# Patient Record
Sex: Female | Born: 1935 | Race: White | Hispanic: No | Marital: Single | Smoking: Former smoker
Health system: Southern US, Community
[De-identification: ages and names within clinical notes are randomized; demographics above are authoritative.]

## PROBLEM LIST (undated history)

## (undated) DIAGNOSIS — R609 Edema, unspecified: Secondary | ICD-10-CM

## (undated) DIAGNOSIS — J309 Allergic rhinitis, unspecified: Secondary | ICD-10-CM

## (undated) DIAGNOSIS — H35033 Hypertensive retinopathy, bilateral: Secondary | ICD-10-CM

## (undated) DIAGNOSIS — I350 Nonrheumatic aortic (valve) stenosis: Secondary | ICD-10-CM

## (undated) DIAGNOSIS — Z9189 Other specified personal risk factors, not elsewhere classified: Secondary | ICD-10-CM

## (undated) DIAGNOSIS — M549 Dorsalgia, unspecified: Secondary | ICD-10-CM

## (undated) DIAGNOSIS — M899 Disorder of bone, unspecified: Secondary | ICD-10-CM

## (undated) DIAGNOSIS — M949 Disorder of cartilage, unspecified: Secondary | ICD-10-CM

## (undated) DIAGNOSIS — R011 Cardiac murmur, unspecified: Secondary | ICD-10-CM

## (undated) DIAGNOSIS — M810 Age-related osteoporosis without current pathological fracture: Secondary | ICD-10-CM

## (undated) DIAGNOSIS — G231 Progressive supranuclear ophthalmoplegia [Steele-Richardson-Olszewski]: Secondary | ICD-10-CM

## (undated) DIAGNOSIS — K117 Disturbances of salivary secretion: Secondary | ICD-10-CM

## (undated) DIAGNOSIS — I10 Essential (primary) hypertension: Secondary | ICD-10-CM

## (undated) DIAGNOSIS — Z86010 Personal history of colonic polyps: Secondary | ICD-10-CM

## (undated) DIAGNOSIS — G20 Parkinson's disease: Secondary | ICD-10-CM

## (undated) DIAGNOSIS — N183 Chronic kidney disease, stage 3 unspecified: Secondary | ICD-10-CM

## (undated) HISTORY — DX: Nonrheumatic aortic (valve) stenosis: I35.0

## (undated) HISTORY — DX: Disturbances of salivary secretion: K11.7

## (undated) HISTORY — DX: Personal history of colonic polyps: Z86.010

## (undated) HISTORY — DX: Progressive supranuclear ophthalmoplegia (steele-Richardson-olszewski): G23.1

## (undated) HISTORY — DX: Hypertensive retinopathy, bilateral: H35.033

## (undated) HISTORY — DX: Disorder of cartilage, unspecified: M94.9

## (undated) HISTORY — DX: Other specified personal risk factors, not elsewhere classified: Z91.89

## (undated) HISTORY — DX: Disorder of bone, unspecified: M89.9

## (undated) HISTORY — PX: HEMORRHOID SURGERY: SHX153

## (undated) HISTORY — PX: BREAST SURGERY: SHX581

## (undated) HISTORY — PX: BREAST EXCISIONAL BIOPSY: SUR124

## (undated) HISTORY — DX: Chronic kidney disease, stage 3 unspecified: N18.30

## (undated) HISTORY — DX: Allergic rhinitis, unspecified: J30.9

## (undated) HISTORY — DX: Essential (primary) hypertension: I10

## (undated) HISTORY — DX: Edema, unspecified: R60.9

## (undated) HISTORY — DX: Age-related osteoporosis without current pathological fracture: M81.0

## (undated) HISTORY — DX: Dorsalgia, unspecified: M54.9

## (undated) HISTORY — DX: Cardiac murmur, unspecified: R01.1

## (undated) HISTORY — PX: TONSILLECTOMY: SHX5217

---

## 1997-10-27 ENCOUNTER — Ambulatory Visit (HOSPITAL_COMMUNITY): Admission: RE | Admit: 1997-10-27 | Discharge: 1997-10-27 | Payer: Self-pay | Admitting: Internal Medicine

## 1998-11-04 ENCOUNTER — Ambulatory Visit (HOSPITAL_COMMUNITY): Admission: RE | Admit: 1998-11-04 | Discharge: 1998-11-04 | Payer: Self-pay | Admitting: Obstetrics and Gynecology

## 1998-11-04 ENCOUNTER — Encounter: Payer: Self-pay | Admitting: Obstetrics and Gynecology

## 1998-11-10 ENCOUNTER — Encounter: Payer: Self-pay | Admitting: Obstetrics and Gynecology

## 1998-11-10 ENCOUNTER — Ambulatory Visit (HOSPITAL_COMMUNITY): Admission: RE | Admit: 1998-11-10 | Discharge: 1998-11-10 | Payer: Self-pay | Admitting: Obstetrics and Gynecology

## 1999-11-21 ENCOUNTER — Encounter: Payer: Self-pay | Admitting: Obstetrics and Gynecology

## 1999-11-21 ENCOUNTER — Ambulatory Visit (HOSPITAL_COMMUNITY): Admission: RE | Admit: 1999-11-21 | Discharge: 1999-11-21 | Payer: Self-pay | Admitting: Obstetrics and Gynecology

## 2000-10-16 ENCOUNTER — Encounter: Admission: RE | Admit: 2000-10-16 | Discharge: 2000-10-16 | Payer: Self-pay | Admitting: Obstetrics and Gynecology

## 2000-10-16 ENCOUNTER — Encounter: Payer: Self-pay | Admitting: Obstetrics and Gynecology

## 2000-11-21 ENCOUNTER — Encounter: Payer: Self-pay | Admitting: Obstetrics and Gynecology

## 2000-11-21 ENCOUNTER — Ambulatory Visit (HOSPITAL_COMMUNITY): Admission: RE | Admit: 2000-11-21 | Discharge: 2000-11-21 | Payer: Self-pay | Admitting: Obstetrics and Gynecology

## 2001-09-19 ENCOUNTER — Other Ambulatory Visit: Admission: RE | Admit: 2001-09-19 | Discharge: 2001-09-19 | Payer: Self-pay | Admitting: Obstetrics and Gynecology

## 2002-01-07 ENCOUNTER — Ambulatory Visit (HOSPITAL_COMMUNITY): Admission: RE | Admit: 2002-01-07 | Discharge: 2002-01-07 | Payer: Self-pay | Admitting: Obstetrics and Gynecology

## 2002-01-07 ENCOUNTER — Encounter: Payer: Self-pay | Admitting: Obstetrics and Gynecology

## 2003-01-11 ENCOUNTER — Ambulatory Visit (HOSPITAL_COMMUNITY): Admission: RE | Admit: 2003-01-11 | Discharge: 2003-01-11 | Payer: Self-pay | Admitting: Internal Medicine

## 2003-01-11 ENCOUNTER — Encounter: Payer: Self-pay | Admitting: Internal Medicine

## 2003-01-25 ENCOUNTER — Encounter: Admission: RE | Admit: 2003-01-25 | Discharge: 2003-01-25 | Payer: Self-pay | Admitting: Obstetrics and Gynecology

## 2004-02-10 ENCOUNTER — Ambulatory Visit (HOSPITAL_COMMUNITY): Admission: RE | Admit: 2004-02-10 | Discharge: 2004-02-10 | Payer: Self-pay | Admitting: Obstetrics and Gynecology

## 2004-02-16 ENCOUNTER — Ambulatory Visit: Payer: Self-pay | Admitting: Internal Medicine

## 2004-02-24 ENCOUNTER — Ambulatory Visit: Payer: Self-pay | Admitting: Internal Medicine

## 2004-03-08 ENCOUNTER — Other Ambulatory Visit: Admission: RE | Admit: 2004-03-08 | Discharge: 2004-03-08 | Payer: Self-pay | Admitting: Obstetrics and Gynecology

## 2004-04-28 ENCOUNTER — Ambulatory Visit: Payer: Self-pay | Admitting: Internal Medicine

## 2004-08-25 ENCOUNTER — Ambulatory Visit: Payer: Self-pay | Admitting: Internal Medicine

## 2004-11-28 ENCOUNTER — Ambulatory Visit: Payer: Self-pay | Admitting: Internal Medicine

## 2005-03-12 ENCOUNTER — Ambulatory Visit (HOSPITAL_COMMUNITY): Admission: RE | Admit: 2005-03-12 | Discharge: 2005-03-12 | Payer: Self-pay | Admitting: Obstetrics and Gynecology

## 2005-03-15 ENCOUNTER — Other Ambulatory Visit: Admission: RE | Admit: 2005-03-15 | Discharge: 2005-03-15 | Payer: Self-pay | Admitting: *Deleted

## 2005-05-24 ENCOUNTER — Ambulatory Visit: Payer: Self-pay | Admitting: Internal Medicine

## 2005-06-05 ENCOUNTER — Ambulatory Visit: Payer: Self-pay | Admitting: Internal Medicine

## 2005-06-15 ENCOUNTER — Ambulatory Visit: Payer: Self-pay | Admitting: Gastroenterology

## 2005-06-25 ENCOUNTER — Ambulatory Visit: Payer: Self-pay | Admitting: Gastroenterology

## 2005-06-25 ENCOUNTER — Encounter (INDEPENDENT_AMBULATORY_CARE_PROVIDER_SITE_OTHER): Payer: Self-pay | Admitting: *Deleted

## 2005-12-06 ENCOUNTER — Ambulatory Visit: Payer: Self-pay | Admitting: Internal Medicine

## 2006-03-15 ENCOUNTER — Ambulatory Visit (HOSPITAL_COMMUNITY): Admission: RE | Admit: 2006-03-15 | Discharge: 2006-03-15 | Payer: Self-pay | Admitting: Obstetrics & Gynecology

## 2006-06-04 ENCOUNTER — Ambulatory Visit: Payer: Self-pay | Admitting: Internal Medicine

## 2006-06-04 LAB — CONVERTED CEMR LAB
Albumin: 3.6 g/dL (ref 3.5–5.2)
Alkaline Phosphatase: 59 units/L (ref 39–117)
BUN: 11 mg/dL (ref 6–23)
Basophils Absolute: 0 10*3/uL (ref 0.0–0.1)
Cholesterol: 186 mg/dL (ref 0–200)
Eosinophils Absolute: 0.1 10*3/uL (ref 0.0–0.6)
GFR calc Af Amer: 63 mL/min
HDL: 52.4 mg/dL (ref >39.0)
Hemoglobin: 13.8 g/dL (ref 12.0–15.0)
LDL Cholesterol: 111 mg/dL — ABNORMAL HIGH (ref 0–99)
Lymphocytes Relative: 24.7 % (ref 12.0–46.0)
MCHC: 34.4 g/dL (ref 30.0–36.0)
MCV: 87.5 fL (ref 78.0–100.0)
Monocytes Absolute: 0.4 10*3/uL (ref 0.2–0.7)
Monocytes Relative: 6.7 % (ref 3.0–11.0)
Neutro Abs: 4.3 10*3/uL (ref 1.4–7.7)
Potassium: 4.4 meq/L (ref 3.5–5.1)
TSH: 1.14 microintl units/mL (ref 0.35–5.50)

## 2006-11-21 DIAGNOSIS — I10 Essential (primary) hypertension: Secondary | ICD-10-CM | POA: Insufficient documentation

## 2006-11-21 DIAGNOSIS — Z860100 Personal history of colon polyps, unspecified: Secondary | ICD-10-CM

## 2006-11-21 DIAGNOSIS — M810 Age-related osteoporosis without current pathological fracture: Secondary | ICD-10-CM

## 2006-11-21 DIAGNOSIS — J309 Allergic rhinitis, unspecified: Secondary | ICD-10-CM

## 2006-11-21 HISTORY — DX: Age-related osteoporosis without current pathological fracture: M81.0

## 2006-11-21 HISTORY — DX: Essential (primary) hypertension: I10

## 2006-11-21 HISTORY — DX: Personal history of colon polyps, unspecified: Z86.0100

## 2006-11-21 HISTORY — DX: Allergic rhinitis, unspecified: J30.9

## 2006-11-21 HISTORY — DX: Personal history of colonic polyps: Z86.010

## 2006-12-17 ENCOUNTER — Ambulatory Visit: Payer: Self-pay | Admitting: Internal Medicine

## 2006-12-17 DIAGNOSIS — M549 Dorsalgia, unspecified: Secondary | ICD-10-CM | POA: Insufficient documentation

## 2006-12-17 HISTORY — DX: Dorsalgia, unspecified: M54.9

## 2007-02-06 ENCOUNTER — Other Ambulatory Visit: Admission: RE | Admit: 2007-02-06 | Discharge: 2007-02-06 | Payer: Self-pay | Admitting: Obstetrics & Gynecology

## 2007-03-28 ENCOUNTER — Ambulatory Visit (HOSPITAL_COMMUNITY): Admission: RE | Admit: 2007-03-28 | Discharge: 2007-03-28 | Payer: Self-pay | Admitting: Obstetrics & Gynecology

## 2007-06-09 ENCOUNTER — Ambulatory Visit: Payer: Self-pay | Admitting: Internal Medicine

## 2007-12-05 ENCOUNTER — Ambulatory Visit: Payer: Self-pay | Admitting: Internal Medicine

## 2007-12-05 LAB — CONVERTED CEMR LAB
ALT: 17 units/L (ref 0–35)
Basophils Absolute: 0 10*3/uL (ref 0.0–0.1)
Bilirubin, Direct: 0.1 mg/dL (ref 0.0–0.3)
CO2: 31 meq/L (ref 19–32)
Calcium: 9.1 mg/dL (ref 8.4–10.5)
Cholesterol: 174 mg/dL (ref 0–200)
Creatinine, Ser: 1.1 mg/dL (ref 0.4–1.2)
Eosinophils Absolute: 0.1 10*3/uL (ref 0.0–0.7)
GFR calc non Af Amer: 52 mL/min
Hemoglobin: 13.1 g/dL (ref 12.0–15.0)
LDL Cholesterol: 104 mg/dL — ABNORMAL HIGH (ref 0–99)
Lymphocytes Relative: 29.5 % (ref 12.0–46.0)
MCHC: 35 g/dL (ref 30.0–36.0)
MCV: 88.8 fL (ref 78.0–100.0)
Neutro Abs: 3.4 10*3/uL (ref 1.4–7.7)
Neutrophils Relative %: 61.6 % (ref 43.0–77.0)
RDW: 11.9 % (ref 11.5–14.6)
Sodium: 141 meq/L (ref 135–145)
TSH: 1.36 microintl units/mL (ref 0.35–5.50)
Total Bilirubin: 0.8 mg/dL (ref 0.3–1.2)
Triglycerides: 116 mg/dL (ref 0–149)
VLDL: 23 mg/dL (ref 0–40)

## 2008-04-20 ENCOUNTER — Ambulatory Visit (HOSPITAL_COMMUNITY): Admission: RE | Admit: 2008-04-20 | Discharge: 2008-04-20 | Payer: Self-pay | Admitting: Obstetrics & Gynecology

## 2008-06-04 ENCOUNTER — Ambulatory Visit: Payer: Self-pay | Admitting: Internal Medicine

## 2008-07-05 ENCOUNTER — Ambulatory Visit: Payer: Self-pay | Admitting: Internal Medicine

## 2008-07-05 DIAGNOSIS — R609 Edema, unspecified: Secondary | ICD-10-CM | POA: Insufficient documentation

## 2008-07-05 HISTORY — DX: Edema, unspecified: R60.9

## 2008-10-12 ENCOUNTER — Ambulatory Visit: Payer: Self-pay | Admitting: Internal Medicine

## 2008-10-12 DIAGNOSIS — R011 Cardiac murmur, unspecified: Secondary | ICD-10-CM

## 2008-10-12 HISTORY — DX: Cardiac murmur, unspecified: R01.1

## 2008-10-18 ENCOUNTER — Encounter: Payer: Self-pay | Admitting: Internal Medicine

## 2008-10-18 ENCOUNTER — Ambulatory Visit: Payer: Self-pay

## 2008-10-19 ENCOUNTER — Telehealth: Payer: Self-pay | Admitting: *Deleted

## 2009-02-10 ENCOUNTER — Ambulatory Visit: Payer: Self-pay | Admitting: Internal Medicine

## 2009-02-10 LAB — CONVERTED CEMR LAB
ALT: 19 units/L (ref 0–35)
AST: 20 units/L (ref 0–37)
BUN: 20 mg/dL (ref 6–23)
Basophils Absolute: 0 10*3/uL (ref 0.0–0.1)
Bilirubin, Direct: 0.1 mg/dL (ref 0.0–0.3)
Calcium: 9.8 mg/dL (ref 8.4–10.5)
Cholesterol: 184 mg/dL (ref 0–200)
Creatinine, Ser: 1.2 mg/dL (ref 0.4–1.2)
Eosinophils Relative: 1.7 % (ref 0.0–5.0)
GFR calc non Af Amer: 46.61 mL/min (ref >60)
Glucose, Bld: 91 mg/dL (ref 70–99)
HCT: 39.4 % (ref 36.0–46.0)
HDL: 50.9 mg/dL (ref >39.00)
LDL Cholesterol: 103 mg/dL — ABNORMAL HIGH (ref 0–99)
Lymphs Abs: 1.8 10*3/uL (ref 0.7–4.0)
Monocytes Relative: 6.2 % (ref 3.0–12.0)
Neutrophils Relative %: 63.9 % (ref 43.0–77.0)
Platelets: 158 10*3/uL (ref 150.0–400.0)
Potassium: 4.5 meq/L (ref 3.5–5.1)
RDW: 11.8 % (ref 11.5–14.6)
TSH: 1.38 microintl units/mL (ref 0.35–5.50)
Total Bilirubin: 0.8 mg/dL (ref 0.3–1.2)
Triglycerides: 152 mg/dL — ABNORMAL HIGH (ref 0.0–149.0)
VLDL: 30.4 mg/dL (ref 0.0–40.0)
WBC: 6.4 10*3/uL (ref 4.5–10.5)

## 2009-04-28 ENCOUNTER — Encounter: Payer: Self-pay | Admitting: Internal Medicine

## 2009-04-28 ENCOUNTER — Ambulatory Visit (HOSPITAL_COMMUNITY): Admission: RE | Admit: 2009-04-28 | Discharge: 2009-04-28 | Payer: Self-pay | Admitting: Internal Medicine

## 2009-04-28 LAB — HM MAMMOGRAPHY: HM Mammogram: NEGATIVE

## 2009-08-11 ENCOUNTER — Ambulatory Visit: Payer: Self-pay | Admitting: Internal Medicine

## 2009-08-11 DIAGNOSIS — M949 Disorder of cartilage, unspecified: Secondary | ICD-10-CM

## 2009-08-11 DIAGNOSIS — M899 Disorder of bone, unspecified: Secondary | ICD-10-CM | POA: Insufficient documentation

## 2009-08-11 HISTORY — DX: Disorder of bone, unspecified: M89.9

## 2010-02-09 ENCOUNTER — Ambulatory Visit: Payer: Self-pay | Admitting: Internal Medicine

## 2010-03-26 HISTORY — PX: CATARACT EXTRACTION: SUR2

## 2010-04-24 ENCOUNTER — Other Ambulatory Visit: Payer: Self-pay | Admitting: Internal Medicine

## 2010-04-24 DIAGNOSIS — Z1239 Encounter for other screening for malignant neoplasm of breast: Secondary | ICD-10-CM

## 2010-04-27 NOTE — Assessment & Plan Note (Signed)
Summary: 6 month fup//ccm   Vital Signs:  Patient profile:   77 year old female Weight:      169 pounds Temp:     97.9 degrees F oral BP sitting:   122 / 74  (right arm) Cuff size:   regular  Vitals Entered By: Duard Brady LPN (February 09, 2010 10:30 AM) andCC: 6 mos rov - doing well  Is Patient Diabetic? No   CC:  6 mos rov - doing well .  History of Present Illness: 77 year old patient who is seen today for follow-up.  Her in she has a history of osteoporosis and has self discontinued alendronate, recently did to some job, discomfort, and a popping sensation.  She has been on this medicine approximately 6 years she has treated hypertension, which has been stable.  She has a history of colonic polyps.  She has a history of  a systolic heart murmur.  She also has a history of allergic rhinitis, which has been stable  Allergies: 1)  ! Codeine Phosphate (Codeine Phosphate)  Past History:  Past Medical History: Reviewed history from 08/11/2009 and no changes required. Colonic polyps, hx of Hypertension  Allergic rhinitis Aortic sclerosis, hx Osteopenia  Past Surgical History: Reviewed history from 12/05/2007 and no changes required. Hemorrhoidectomy Tonsillectomy age  43 Breast biopsy  colonoscopy April 2007  Family History: Reviewed history from 02/10/2009 and no changes required. father died at 26.  Coronary artery disease.   Mother died age 58, ovarian cancer one of 54.  Siblings positive for senile dementia breast cancer, COPD; also positive for brain cancer; CAD  Social History: Reviewed history from 02/10/2009 and no changes required. Married Never Smoked two sons, one grandson; two-step granddaughters  Review of Systems  The patient denies anorexia, fever, weight loss, weight gain, vision loss, decreased hearing, hoarseness, chest pain, syncope, dyspnea on exertion, peripheral edema, prolonged cough, headaches, hemoptysis, abdominal pain, melena,  hematochezia, severe indigestion/heartburn, hematuria, incontinence, genital sores, muscle weakness, suspicious skin lesions, transient blindness, difficulty walking, depression, unusual weight change, abnormal bleeding, enlarged lymph nodes, angioedema, and breast masses.    Physical Exam  General:  Well-developed,well-nourished,in no acute distress; alert,appropriate and cooperative throughout examination Head:  Normocephalic and atraumatic without obvious abnormalities. No apparent alopecia or balding. Eyes:  No corneal or conjunctival inflammation noted. EOMI. Perrla. Funduscopic exam benign, without hemorrhages, exudates or papilledema. Vision grossly normal. Mouth:  Oral mucosa and oropharynx without lesions or exudates.  Teeth in good repair. Neck:  No deformities, masses, or tenderness noted. Lungs:  Normal respiratory effort, chest expands symmetrically. Lungs are clear to auscultation, no crackles or wheezes. Heart:  grade 3/6 systolic murmur, loudest at the primary aortic area with radiation to the supraclavicular area Abdomen:  Bowel sounds positive,abdomen soft and non-tender without masses, organomegaly or hernias noted. Msk:  No deformity or scoliosis noted of thoracic or lumbar spine.   Pulses:  R and L carotid,radial,femoral,dorsalis pedis and posterior tibial pulses are full and equal bilaterally Extremities:  No clubbing, cyanosis, edema, or deformity noted with normal full range of motion of all joints.     Impression & Recommendations:  Problem # 1:  OSTEOPENIA (ICD-733.90)  The following medications were removed from the medication list:    Alendronate Sodium 70 Mg Tabs (Alendronate sodium) ..... One weekly  The following medications were removed from the medication list:    Alendronate Sodium 70 Mg Tabs (Alendronate sodium) ..... One weekly  Problem # 2:  SYSTOLIC MURMUR (ZOX-096.2)  Problem #  3:  HYPERTENSION (ICD-401.9)  Her updated medication list for this  problem includes:    Hydrochlorothiazide 25 Mg Tabs (Hydrochlorothiazide) .Marland Kitchen... 1 once daily    Benazepril Hcl 40 Mg Tabs (Benazepril hcl) .Marland Kitchen... 1 once daily  Her updated medication list for this problem includes:    Hydrochlorothiazide 25 Mg Tabs (Hydrochlorothiazide) .Marland Kitchen... 1 once daily    Benazepril Hcl 40 Mg Tabs (Benazepril hcl) .Marland Kitchen... 1 once daily  Problem # 4:  COLONIC POLYPS, HX OF (ICD-V12.72)  Complete Medication List: 1)  Hydrochlorothiazide 25 Mg Tabs (Hydrochlorothiazide) .Marland Kitchen.. 1 once daily 2)  Benazepril Hcl 40 Mg Tabs (Benazepril hcl) .Marland Kitchen.. 1 once daily 3)  Caltrate 600 1500 Mg Tabs (Calcium carbonate) .... Two times a day 4)  Vitamin D 1000 Unit Caps (Cholecalciferol) .... Once daily 5)  Adult Aspirin Ec Low Strength 81 Mg Tbec (Aspirin) .Marland Kitchen.. 1 once daily 6)  Multivitamins Caps (Multiple vitamin) .... Qd  Other Orders: Pneumococcal Vaccine (16109) Admin 1st Vaccine (60454)  Patient Instructions: 1)  Please schedule a follow-up appointment in 6 months. 2)  Limit your Sodium (Salt). 3)  It is important that you exercise regularly at least 20 minutes 5 times a week. If you develop chest pain, have severe difficulty breathing, or feel very tired , stop exercising immediately and seek medical attention. 4)  Take calcium +Vitamin D daily. 5)  Advised not to eat any food or drink any liquids after 10 PM the night before your procedure. Prescriptions: BENAZEPRIL HCL 40 MG  TABS (BENAZEPRIL HCL) 1 once daily  #90 x 6   Entered and Authorized by:   Gordy Savers  MD   Signed by:   Gordy Savers  MD on 02/09/2010   Method used:   Electronically to        [facility]* (retail)             ,          Ph: [phone]       Fax: [phone]   RxID:   [id number] HYDROCHLOROTHIAZIDE 25 MG  TABS (HYDROCHLOROTHIAZIDE) 1 once daily  #90 x 6   Entered and Authorized by:   Gordy Savers  MD   Signed by:   Gordy Savers  MD on 02/09/2010   Method used:    Electronically to        [facility]* (retail)             ,          Ph: [phone]       Fax: [phone]   RxID:   [id number]    Orders Added: 1)  Pneumococcal Vaccine [90732] 2)  Admin 1st Vaccine [90471] 3)  Est. Patient Level IV [88416]   Immunization History:  Influenza Immunization History:    Influenza:  Historical (01/12/2010)  Pneumovax Immunization History:    Pneumovax:  Pneumovax (02/09/2010)  Immunizations Administered:  Pneumonia Vaccine:    Vaccine Type: Pneumovax    Site: left deltoid    Mfr: Merck    Dose: 0.5 ml    Route: IM    Given by: Duard Brady LPN    Exp. Date: 07/18/2011    Lot #: [id number]    VIS given: 02/28/09 version given February 09, 2010.    Physician counseled: yes   Immunization History:  Influenza Immunization History:    Influenza:  Historical (01/12/2010)  Immunizations Administered:  Pneumonia Vaccine:    Vaccine Type: Pneumovax  Site: left deltoid    Mfr: Merck    Dose: 0.5 ml    Route: IM    Given by: Duard Brady LPN    Exp. Date: 07/18/2011    Lot #: [id number]    VIS given: 02/28/09 version given February 09, 2010.    Physician counseled: yes #2 given kik  flu given at [facility] per pt. kik

## 2010-04-27 NOTE — Assessment & Plan Note (Signed)
Summary: 6 MONTH FUP//CCM   Vital Signs:  Patient profile:   77 year old female Weight:      168 pounds Temp:     98.0 degrees F oral BP sitting:   128 / 80  (left arm) Cuff size:   regular  Vitals Entered By: Duard Brady LPN (Aug 11, 2009 9:59 AM) CC: 6 mov rov - doing well Is Patient Diabetic? No   CC:  6 mov rov - doing well.  History of Present Illness: 33 -year-old patient who is seen today for follow-up.  She has a history of osteopenia and Fosamax was discontinued about one year ago after 5 years of therapy.  She has had a recent bone density that reveals a decline in bone density since her last BMD.  She has hypertension, which has been well-controlled.  She also has a history of colonic polyps and a history of a systolic murmur related to mild aortic sclerosis.  A follow-up 2-D echocardiogram was done in July of 2010.  Allergies: 1)  ! Codeine Phosphate (Codeine Phosphate)  Past History:  Past Medical History: Colonic polyps, hx of Hypertension  Allergic rhinitis Aortic sclerosis, hx Osteopenia  Family History: Reviewed history from 02/10/2009 and no changes required. father died at 29.  Coronary artery disease.   Mother died age 72, ovarian cancer one of 63.  Siblings positive for senile dementia breast cancer, COPD; also positive for brain cancer; CAD  Social History: Reviewed history from 02/10/2009 and no changes required. Married Never Smoked two sons, one grandson; two-step granddaughters  Review of Systems  The patient denies anorexia, fever, weight loss, weight gain, vision loss, decreased hearing, hoarseness, chest pain, syncope, dyspnea on exertion, peripheral edema, prolonged cough, headaches, hemoptysis, abdominal pain, melena, hematochezia, severe indigestion/heartburn, hematuria, incontinence, genital sores, muscle weakness, suspicious skin lesions, transient blindness, difficulty walking, depression, unusual weight change, abnormal  bleeding, enlarged lymph nodes, angioedema, and breast masses.    Physical Exam  General:  Well-developed,well-nourished,in no acute distress; alert,appropriate and cooperative throughout examination Head:  Normocephalic and atraumatic without obvious abnormalities. No apparent alopecia or balding. Eyes:  No corneal or conjunctival inflammation noted. EOMI. Perrla. Funduscopic exam benign, without hemorrhages, exudates or papilledema. Vision grossly normal. Mouth:  Oral mucosa and oropharynx without lesions or exudates.  Teeth in good repair. Neck:  transmitter murmur, right greater than left Lungs:  Normal respiratory effort, chest expands symmetrically. Lungs are clear to auscultation, no crackles or wheezes. Heart:  grade 3/6 systolic murmur, loudest at the primary aortic area Abdomen:  Bowel sounds positive,abdomen soft and non-tender without masses, organomegaly or hernias noted. Msk:  No deformity or scoliosis noted of thoracic or lumbar spine.   Pulses:  R and L carotid,radial,femoral,dorsalis pedis and posterior tibial pulses are full and equal bilaterally Extremities:  No clubbing, cyanosis, edema, or deformity noted with normal full range of motion of all joints.     Impression & Recommendations:  Problem # 1:  OSTEOPENIA (ICD-733.90)  Her updated medication list for this problem includes:    Alendronate Sodium 70 Mg Tabs (Alendronate sodium) ..... One weekly  Her updated medication list for this problem includes:    Alendronate Sodium 70 Mg Tabs (Alendronate sodium) ..... One weekly  Problem # 2:  SYSTOLIC MURMUR (ZOX-096.2)  Problem # 3:  HYPERTENSION (ICD-401.9)  Her updated medication list for this problem includes:    Hydrochlorothiazide 25 Mg Tabs (Hydrochlorothiazide) .Marland Kitchen... 1 once daily    Benazepril Hcl 40 Mg Tabs (Benazepril  hcl) ..... 1 once daily    Her updated medication list for this problem includes:    Hydrochlorothiazide 25 Mg Tabs (Hydrochlorothiazide)  .Marland Kitchen... 1 once daily    Benazepril Hcl 40 Mg Tabs (Benazepril hcl) .Marland Kitchen... 1 once daily  Complete Medication List: 1)  Hydrochlorothiazide 25 Mg Tabs (Hydrochlorothiazide) .Marland Kitchen.. 1 once daily 2)  Benazepril Hcl 40 Mg Tabs (Benazepril hcl) .Marland Kitchen.. 1 once daily 3)  Caltrate 600 1500 Mg Tabs (Calcium carbonate) .... Two times a day 4)  Vitamin D 1000 Unit Caps (Cholecalciferol) .... Once daily 5)  Adult Aspirin Ec Low Strength 81 Mg Tbec (Aspirin) .Marland Kitchen.. 1 once daily 6)  Multivitamins Caps (Multiple vitamin) .... Qd 7)  Alendronate Sodium 70 Mg Tabs (Alendronate sodium) .... One weekly  Other Orders: Prescription Created Electronically [id number])  Patient Instructions: 1)  Please schedule a follow-up appointment in 6 months. 2)  Advised not to eat any food or drink any liquids after 10 PM the night before your procedure. 3)  Limit your Sodium (Salt) to less than 2 grams a day(slightly less than 1/2 a teaspoon) to prevent fluid retention, swelling, or worsening of symptoms. 4)  It is important that you exercise regularly at least 20 minutes 5 times a week. If you develop chest pain, have severe difficulty breathing, or feel very tired , stop exercising immediately and seek medical attention. 5)  Take calcium +Vitamin D daily. 6)  Check your Blood Pressure regularly. If it is above: 150/90  you should make an appointment. Prescriptions: ALENDRONATE SODIUM 70 MG TABS (ALENDRONATE SODIUM) one weekly  #12 x 6   Entered and Authorized by:   Gordy Savers  MD   Signed by:   Gordy Savers  MD on 08/11/2009   Method used:   Faxed to ...       [facility]* (mail-order)             ,          Ph: [phone]       Fax: [phone]   RxID:   [id number] BENAZEPRIL HCL 40 MG  TABS (BENAZEPRIL HCL) 1 once daily  #90 x 6   Entered and Authorized by:   Gordy Savers  MD   Signed by:   Gordy Savers  MD on 08/11/2009   Method used:   Faxed to ...       [facility]*  (mail-order)             ,          Ph: [phone]       Fax: [phone]   RxID:   [id number] HYDROCHLOROTHIAZIDE 25 MG  TABS (HYDROCHLOROTHIAZIDE) 1 once daily  #90 x 6   Entered and Authorized by:   Gordy Savers  MD   Signed by:   Gordy Savers  MD on 08/11/2009   Method used:   Faxed to ...       [facility]* (mail-order)             ,          Ph: [phone]       Fax: [phone]   RxID:   [id number]

## 2010-05-01 ENCOUNTER — Ambulatory Visit (HOSPITAL_COMMUNITY)
Admission: RE | Admit: 2010-05-01 | Discharge: 2010-05-01 | Disposition: A | Discharge status: Home / Self Care | Payer: Medicare Other | Source: Ambulatory Visit | Attending: Internal Medicine | Admitting: Internal Medicine

## 2010-05-01 DIAGNOSIS — Z1231 Encounter for screening mammogram for malignant neoplasm of breast: Secondary | ICD-10-CM | POA: Insufficient documentation

## 2010-05-01 DIAGNOSIS — Z1239 Encounter for other screening for malignant neoplasm of breast: Secondary | ICD-10-CM

## 2010-08-09 ENCOUNTER — Encounter: Payer: Self-pay | Admitting: Internal Medicine

## 2010-08-11 NOTE — Assessment & Plan Note (Signed)
[facility] OFFICE NOTE   NAME:Tiffany [name], Tiffany [name]                     MRN:          [id number]  DATE:06/04/2006                            DOB:          [birth year]    A 77 year old female seen today for a wellness exam.   She has:  1. Hypertension.  2. Menopausal syndrome.  3. Osteoporosis.  4. Seasonal allergic rhinitis.  5. Also has a history of chronic polyps.   She is doing quite well today.  Does have a gynecologist she sees  annually with annual mammograms.   REVIEW OF SYSTEMS:  Exam is unremarkable.   Did have colonoscopy 11 months ago.  This revealed a single small 7-mm  polyp.   FAMILY HISTORY:  Reviewed.  Basically unchanged.  She is 1 of 17  siblings.   EXAMINATION:  Revealed a healthy-appearing fit patient in no acute  distress.  Blood pressure was 128/78.  SKIN:  Negative.  Fundi, ears, nose and throat clear.  NECK:  Revealed transmitted murmur on the right.  CHEST:  Was clear.  CARDIOVASCULAR EXAM:  Revealed a grade 2-3/6 systolic murmur loudest at  the base.  BREASTS:  Negative.  ABDOMEN:  Soft and non-tender.  No bruits.  EXTREMITIES:  Were slightly cool.  The right dorsalis pedis pulse was  full.  The left was barely palpable.  Posterior tibial pulses were  nonpalpable.  NEUROLOGIC EXAM:  Negative.   IMPRESSION:  1. Hypertension.  2. Osteoporosis.  3. Chronic polyps.  4. Menopausal syndrome.  5. History of aortic sclerosis.   DISPOSITION:  Medical regimen unchanged.  Daily aspirin recommended.  We  will also increase her vitamin D to at least 800 units daily.  Recheck  in 6 months.     Gordy Savers, MD  Electronically Signed    PFK/MedQ  DD: 06/04/2006  DT: 06/04/2006  Job #: [id number]

## 2010-08-14 ENCOUNTER — Encounter: Payer: Self-pay | Admitting: Internal Medicine

## 2010-08-14 ENCOUNTER — Ambulatory Visit (INDEPENDENT_AMBULATORY_CARE_PROVIDER_SITE_OTHER): Payer: Medicare Other | Admitting: Internal Medicine

## 2010-08-14 DIAGNOSIS — M899 Disorder of bone, unspecified: Secondary | ICD-10-CM

## 2010-08-14 DIAGNOSIS — M810 Age-related osteoporosis without current pathological fracture: Secondary | ICD-10-CM

## 2010-08-14 DIAGNOSIS — Z0000 Encounter for general adult medical examination without abnormal findings: Secondary | ICD-10-CM

## 2010-08-14 DIAGNOSIS — R011 Cardiac murmur, unspecified: Secondary | ICD-10-CM

## 2010-08-14 DIAGNOSIS — I10 Essential (primary) hypertension: Secondary | ICD-10-CM

## 2010-08-14 DIAGNOSIS — Z86010 Personal history of colonic polyps: Secondary | ICD-10-CM

## 2010-08-14 DIAGNOSIS — M549 Dorsalgia, unspecified: Secondary | ICD-10-CM

## 2010-08-14 LAB — BASIC METABOLIC PANEL
BUN: 17 mg/dL (ref 6–23)
CO2: 30 mEq/L (ref 19–32)
Chloride: 101 mEq/L (ref 96–112)
Creatinine, Ser: 1.1 mg/dL (ref 0.4–1.2)
Potassium: 5.2 mEq/L — ABNORMAL HIGH (ref 3.5–5.1)

## 2010-08-14 LAB — CBC WITH DIFFERENTIAL/PLATELET
Basophils Relative: 0.5 % (ref 0.0–3.0)
Eosinophils Absolute: 0.1 10*3/uL (ref 0.0–0.7)
Eosinophils Relative: 1.6 % (ref 0.0–5.0)
HCT: 38.2 % (ref 36.0–46.0)
Lymphs Abs: 1.9 10*3/uL (ref 0.7–4.0)
MCHC: 34.7 g/dL (ref 30.0–36.0)
MCV: 89.3 fl (ref 78.0–100.0)
Monocytes Absolute: 0.3 10*3/uL (ref 0.1–1.0)
RBC: 4.27 Mil/uL (ref 3.87–5.11)
WBC: 6 10*3/uL (ref 4.5–10.5)

## 2010-08-14 LAB — LIPID PANEL
Cholesterol: 180 mg/dL (ref 0–200)
HDL: 54.4 mg/dL (ref >39.00)
Triglycerides: 116 mg/dL (ref 0.0–149.0)

## 2010-08-14 LAB — HEPATIC FUNCTION PANEL
ALT: 20 U/L (ref 0–35)
Albumin: 3.6 g/dL (ref 3.5–5.2)
Total Protein: 6.4 g/dL (ref 6.0–8.3)

## 2010-08-14 MED ORDER — BENAZEPRIL HCL 40 MG PO TABS: 40.0000 mg | ORAL_TABLET | Freq: Every day | ORAL | Status: DC

## 2010-08-14 MED ORDER — HYDROCHLOROTHIAZIDE 25 MG PO TABS: 25.0000 mg | ORAL_TABLET | Freq: Every day | ORAL | Status: DC

## 2010-08-14 NOTE — Progress Notes (Signed)
Subjective:    Patient ID: Tiffany [name], female    DOB: 1932/03/28, 77 y.o.   MRN: [id number]  HPI   77 year-old patient who is seen today for a annual health maintenance examination. She has a long history of treated hypertension and this has been stable. She remains on Lotensin and hydrochlorothiazide. She is followed annually by gynecology with annual mammograms. She has had serial bone density studies that have revealed stable osteopenia. She remains on calcium and vitamin D supplements. She has chronic but mild low back pain. This has been unchanged. She has a history of a systolic murmur that is related to a mildly calcified aortic valve there is been no stenosis. She has no exertional symptoms. She has a history of colonic polyps and did have a colonoscopy done within the last 5 years.  1. Risk factors, based on past  M,S,F history- cardiovascular risk factors include hypertension  2.  Physical activities: No exercise restriction although she walks infrequently 3.  Depression/mood: No history of depression or mood disorder 4.  Hearing: No deficits 5.  ADL's: Independent in all aspects of daily living  6.  Fall risk: Low  7.  Home safety: No problems identified  8.  Height weight, and visual acuity; height and weight stable recent left cataract extraction surgery  9.  Counseling: More regular exercise heart healthy diet salt restricted diet all encouraged  10. Lab orders based on risk factors: Laboratory profile including lipid panel and TSH will be reviewed  11. Referral : Follow GYN in ophthalmology  12. Care plan: Continue calcium and vitamin D supplements; more active lifestyle encouraged  13. Cognitive assessment: Alert and oriented with normal affect. No cognitive dysfunction.     Review of Systems  Constitutional: Negative for fever, appetite change, fatigue and unexpected weight change.  HENT: Negative for hearing loss, ear pain, nosebleeds, congestion, sore  throat, mouth sores, trouble swallowing, neck stiffness, dental problem, voice change, sinus pressure and tinnitus.   Eyes: Negative for photophobia, pain, redness and visual disturbance.  Respiratory: Negative for cough, chest tightness and shortness of breath.   Cardiovascular: Negative for chest pain, palpitations and leg swelling.  Gastrointestinal: Negative for nausea, vomiting, abdominal pain, diarrhea, constipation, blood in stool, abdominal distention and rectal pain.  Genitourinary: Negative for dysuria, urgency, frequency, hematuria, flank pain, vaginal bleeding, vaginal discharge, difficulty urinating, genital sores, vaginal pain, menstrual problem and pelvic pain.  Musculoskeletal: Negative for back pain and arthralgias.  Skin: Negative for rash.  Neurological: Negative for dizziness, syncope, speech difficulty, weakness, light-headedness, numbness and headaches.  Hematological: Negative for adenopathy. Does not bruise/bleed easily.  Psychiatric/Behavioral: Negative for suicidal ideas, behavioral problems, self-injury, dysphoric mood and agitation. The patient is not nervous/anxious.        Objective:   Physical Exam  Constitutional: She is oriented to person, place, and time. She appears well-developed and well-nourished.  HENT:  Head: Normocephalic and atraumatic.  Right Ear: External ear normal.  Left Ear: External ear normal.  Mouth/Throat: Oropharynx is clear and moist.  Eyes: Conjunctivae and EOM are normal.  Neck: Normal range of motion. Neck supple. No JVD present. No thyromegaly present.  Cardiovascular: Normal rate, regular rhythm and normal heart sounds.   No murmur heard.      The right dorsalis pedis pulse full other pedal pulses were not easily palpable  Pulmonary/Chest: Effort normal and breath sounds normal. She has no wheezes. She has no rales.       A few  crackles right base  Abdominal: Soft. Bowel sounds are normal. She exhibits no distension and no mass.  There is no tenderness. There is no rebound and no guarding.  Musculoskeletal: Normal range of motion. She exhibits no edema and no tenderness.  Neurological: She is alert and oriented to person, place, and time. She has normal reflexes. No cranial nerve deficit. She exhibits normal muscle tone. Coordination normal.  Skin: Skin is warm and dry. No rash noted.  Psychiatric: She has a normal mood and affect. Her behavior is normal.          Assessment & Plan:   Annual health maintenance examination Osteopenia. Stable we'll continue calcium and vitamin D supplements Hypertension well controlled we'll continue present regimen

## 2010-08-14 NOTE — Patient Instructions (Signed)
Limit your sodium (Salt) intake    It is important that you exercise regularly, at least 20 minutes 3 to 4 times per week.  If you develop chest pain or shortness of breath seek  medical attention.  Take a calcium supplement, plus 800-1200 units of vitamin D  Please check your blood pressure on a regular basis.  If it is consistently greater than 150/90, please make an office appointment.  Return in 6 months for follow-up  

## 2011-02-14 ENCOUNTER — Ambulatory Visit (INDEPENDENT_AMBULATORY_CARE_PROVIDER_SITE_OTHER): Payer: Medicare Other | Admitting: Internal Medicine

## 2011-02-14 ENCOUNTER — Encounter: Payer: Self-pay | Admitting: Internal Medicine

## 2011-02-14 DIAGNOSIS — R011 Cardiac murmur, unspecified: Secondary | ICD-10-CM

## 2011-02-14 DIAGNOSIS — M549 Dorsalgia, unspecified: Secondary | ICD-10-CM

## 2011-02-14 DIAGNOSIS — I10 Essential (primary) hypertension: Secondary | ICD-10-CM

## 2011-02-14 MED ORDER — BENAZEPRIL HCL 40 MG PO TABS: 40.0000 mg | ORAL_TABLET | Freq: Every day | ORAL | Status: DC

## 2011-02-14 MED ORDER — HYDROCHLOROTHIAZIDE 25 MG PO TABS: 25.0000 mg | ORAL_TABLET | Freq: Every day | ORAL | Status: DC

## 2011-02-14 NOTE — Patient Instructions (Signed)
Limit your sodium (Salt) intake    It is important that you exercise regularly, at least 20 minutes 3 to 4 times per week.  If you develop chest pain or shortness of breath seek  medical attention.  Take 650  mg of Tylenol every 6 hours as needed for pain relief or fever.  Avoid taking more than 3000 mg in a 24-hour period (  This may cause liver damage).   Take Aleve 200 mg twice daily for pain  Please check your blood pressure on a regular basis.  If it is consistently greater than 150/90, please make an office appointment.    It is important that you exercise regularly, at least 20 minutes 3 to 4 times per week.  If you develop chest pain or shortness of breath seek  medical attention.  Return in 6 months for follow-up

## 2011-02-14 NOTE — Progress Notes (Signed)
  Subjective:    Patient ID: Tiffany [name], female    DOB: 1932-07-06, 77 y.o.   MRN: [id number]  HPI  77 -year-old patient who is seen today for her bimonthly follow up. She has a history of hypertension which has been well controlled. She has a history of low back pain which is her only complaint today. She has had bilateral cataract extraction surgery this year and is quite pleased with the surgery    Review of Systems  Constitutional: Negative.   HENT: Negative for hearing loss, congestion, sore throat, rhinorrhea, dental problem, sinus pressure and tinnitus.   Eyes: Negative for pain, discharge and visual disturbance.  Respiratory: Negative for cough and shortness of breath.   Cardiovascular: Negative for chest pain, palpitations and leg swelling.  Gastrointestinal: Negative for nausea, vomiting, abdominal pain, diarrhea, constipation, blood in stool and abdominal distention.  Genitourinary: Negative for dysuria, urgency, frequency, hematuria, flank pain, vaginal bleeding, vaginal discharge, difficulty urinating, vaginal pain and pelvic pain.  Musculoskeletal: Positive for back pain. Negative for joint swelling, arthralgias and gait problem.  Skin: Negative for rash.  Neurological: Negative for dizziness, syncope, speech difficulty, weakness, numbness and headaches.  Hematological: Negative for adenopathy.  Psychiatric/Behavioral: Negative for behavioral problems, dysphoric mood and agitation. The patient is not nervous/anxious.        Objective:   Physical Exam  Constitutional: She is oriented to person, place, and time. She appears well-developed and well-nourished.  HENT:  Head: Normocephalic.  Right Ear: External ear normal.  Left Ear: External ear normal.  Mouth/Throat: Oropharynx is clear and moist.  Eyes: Conjunctivae and EOM are normal. Pupils are equal, round, and reactive to light.  Neck: Normal range of motion. Neck supple. No thyromegaly present.  Cardiovascular:  Normal rate, regular rhythm and intact distal pulses.   Murmur heard.      Grade 2-3/6 systolic murmur loudest at the base just  Pulmonary/Chest: Effort normal and breath sounds normal.  Abdominal: Soft. Bowel sounds are normal. She exhibits no mass. There is no tenderness.  Musculoskeletal: Normal range of motion.  Lymphadenopathy:    She has no cervical adenopathy.  Neurological: She is alert and oriented to person, place, and time.  Skin: Skin is warm and dry. No rash noted.  Psychiatric: She has a normal mood and affect. Her behavior is normal.          Assessment & Plan:   Hypertension well controlled Chronic low back pain. The patient has taken no medications we'll start with Tylenol and add Aleve if necessary  Recheck 6 months

## 2011-05-15 ENCOUNTER — Other Ambulatory Visit: Payer: Self-pay | Admitting: Internal Medicine

## 2011-05-15 DIAGNOSIS — Z1231 Encounter for screening mammogram for malignant neoplasm of breast: Secondary | ICD-10-CM

## 2011-06-26 ENCOUNTER — Ambulatory Visit (HOSPITAL_COMMUNITY)
Admission: RE | Admit: 2011-06-26 | Discharge: 2011-06-26 | Disposition: A | Discharge status: Home / Self Care | Payer: Medicare Other | Source: Ambulatory Visit | Attending: Internal Medicine | Admitting: Internal Medicine

## 2011-06-26 DIAGNOSIS — Z1231 Encounter for screening mammogram for malignant neoplasm of breast: Secondary | ICD-10-CM

## 2011-08-15 ENCOUNTER — Ambulatory Visit (INDEPENDENT_AMBULATORY_CARE_PROVIDER_SITE_OTHER): Payer: Medicare Other | Admitting: Internal Medicine

## 2011-08-15 ENCOUNTER — Encounter: Payer: Self-pay | Admitting: Internal Medicine

## 2011-08-15 VITALS — BP 110/80 | HR 80 | Temp 98.3°F | Resp 20 | Ht 64.0 in | Wt 172.0 lb

## 2011-08-15 DIAGNOSIS — M810 Age-related osteoporosis without current pathological fracture: Secondary | ICD-10-CM

## 2011-08-15 DIAGNOSIS — E785 Hyperlipidemia, unspecified: Secondary | ICD-10-CM

## 2011-08-15 DIAGNOSIS — M949 Disorder of cartilage, unspecified: Secondary | ICD-10-CM

## 2011-08-15 DIAGNOSIS — Z86010 Personal history of colonic polyps: Secondary | ICD-10-CM

## 2011-08-15 DIAGNOSIS — Z0000 Encounter for general adult medical examination without abnormal findings: Secondary | ICD-10-CM

## 2011-08-15 DIAGNOSIS — R011 Cardiac murmur, unspecified: Secondary | ICD-10-CM

## 2011-08-15 DIAGNOSIS — M549 Dorsalgia, unspecified: Secondary | ICD-10-CM

## 2011-08-15 DIAGNOSIS — I10 Essential (primary) hypertension: Secondary | ICD-10-CM

## 2011-08-15 LAB — CBC WITH DIFFERENTIAL/PLATELET
Basophils Relative: 0.6 % (ref 0.0–3.0)
Eosinophils Absolute: 0.1 10*3/uL (ref 0.0–0.7)
HCT: 40 % (ref 36.0–46.0)
Hemoglobin: 13.2 g/dL (ref 12.0–15.0)
Lymphocytes Relative: 26.2 % (ref 12.0–46.0)
MCHC: 33.1 g/dL (ref 30.0–36.0)
Monocytes Relative: 4.9 % (ref 3.0–12.0)
Neutro Abs: 4.3 10*3/uL (ref 1.4–7.7)
RBC: 4.43 Mil/uL (ref 3.87–5.11)

## 2011-08-15 LAB — COMPREHENSIVE METABOLIC PANEL
Albumin: 3.8 g/dL (ref 3.5–5.2)
Alkaline Phosphatase: 60 U/L (ref 39–117)
CO2: 29 mEq/L (ref 19–32)
Calcium: 9.5 mg/dL (ref 8.4–10.5)
Chloride: 101 mEq/L (ref 96–112)
Glucose, Bld: 86 mg/dL (ref 70–99)
Potassium: 3.8 mEq/L (ref 3.5–5.1)
Sodium: 140 mEq/L (ref 135–145)
Total Protein: 7.2 g/dL (ref 6.0–8.3)

## 2011-08-15 LAB — LIPID PANEL
Cholesterol: 181 mg/dL (ref 0–200)
LDL Cholesterol: 103 mg/dL — ABNORMAL HIGH (ref 0–99)

## 2011-08-15 MED ORDER — HYDROCHLOROTHIAZIDE 25 MG PO TABS: 25.0000 mg | ORAL_TABLET | Freq: Every day | ORAL | Status: DC

## 2011-08-15 MED ORDER — BENAZEPRIL HCL 40 MG PO TABS: 40.0000 mg | ORAL_TABLET | Freq: Every day | ORAL | Status: DC

## 2011-08-15 NOTE — Progress Notes (Signed)
Patient ID: Tiffany [name], female   DOB: 1933-02-17, 78 y.o.   MRN: [id number]  Subjective:    Patient ID: Tiffany [name], female    DOB: 1932-08-22, 78 y.o.   MRN: [id number]  HPI   78  year-old patient who is seen today for a annual health maintenance examination. She has a long history of treated hypertension and this has been stable. She remains on Lotensin and hydrochlorothiazide. She is followed annually by gynecology with annual mammograms. She has had serial bone density studies that have revealed stable osteopenia. She remains on calcium and vitamin D supplements. She has chronic but mild low back pain. This has been unchanged. She has had a recent flare and has been receiving chiropractic care.  She has a history of a systolic murmur that is related to a mildly calcified aortic valve there is been no stenosis. She has no exertional symptoms. She has a history of colonic polyps and did have a colonoscopy done within the last 5 years. She has had a recent lifestyle screening evaluation   1. Risk factors, based on past  M,S,F history- cardiovascular risk factors include hypertension  2.  Physical activities: No exercise restriction although she walks infrequently 3.  Depression/mood: No history of depression or mood disorder 4.  Hearing: No deficits 5.  ADL's: Independent in all aspects of daily living  6.  Fall risk: Low  7.  Home safety: No problems identified  8.  Height weight, and visual acuity; height and weight stable recent left cataract extraction surgery  9.  Counseling: More regular exercise heart healthy diet salt restricted diet all encouraged  10. Lab orders based on risk factors: Laboratory profile including lipid panel and TSH will be reviewed  11. Referral : Follow GYN  And ophthalmology  12. Care plan: Continue calcium and vitamin D supplements; more active lifestyle encouraged  13. Cognitive assessment: Alert and oriented with normal affect. No cognitive  dysfunction.  Past Medical History  Diagnosis Date  . ALLERGIC RHINITIS 11/21/2006  . BACK PAIN 12/17/2006  . COLONIC POLYPS, HX OF 11/21/2006  . HYPERTENSION 11/21/2006  . OSTEOPENIA 08/11/2009  . OSTEOPOROSIS 11/21/2006  . PEDAL EDEMA 07/05/2008  . SYSTOLIC MURMUR 10/12/2008    History   Social History  . Marital Status: Widowed    Spouse Name: N/A    Number of Children: N/A  . Years of Education: N/A   Occupational History  . Not on file.   Social History Main Topics  . Smoking status: Former Smoker    Quit date: 03/27/1995  . Smokeless tobacco: Never Used  . Alcohol Use: No  . Drug Use: No  . Sexually Active: Not on file   Other Topics Concern  . Not on file   Social History Narrative  . No narrative on file    Past Surgical History  Procedure Date  . Hemorrhoid surgery   . Tonsillectomy   . Breast surgery     bx  . Cataract extraction 2012    No family history on file.  Allergies  Allergen Reactions  . Codeine Phosphate     Current Outpatient Prescriptions on File Prior to Visit  Medication Sig Dispense Refill  . aspirin 81 MG tablet Take 81 mg by mouth daily.        . benazepril (LOTENSIN) 40 MG tablet Take 1 tablet (40 mg total) by mouth daily.  90 tablet  6  . Calcium Carbonate (CALTRATE 600) 1500 MG TABS Take  by mouth 2 (two) times daily.        . Cholecalciferol (VITAMIN D) 1000 UNITS capsule Take 1,000 Units by mouth daily.        . hydrochlorothiazide (HYDRODIURIL) 25 MG tablet Take 1 tablet (25 mg total) by mouth daily.  90 tablet  6  . Multiple Vitamin (MULTIVITAMIN) tablet Take 1 tablet by mouth daily.          BP 110/80  Pulse 80  Temp(Src) 98.3 F (36.8 C) (Oral)  Resp 20  Ht 5\' 4"  (1.626 m)  Wt 172 lb (78.019 kg)  BMI 29.52 kg/m2  SpO2 97%      Review of Systems  Constitutional: Negative for fever, appetite change, fatigue and unexpected weight change.  HENT: Negative for hearing loss, ear pain, nosebleeds, congestion, sore  throat, mouth sores, trouble swallowing, neck stiffness, dental problem, voice change, sinus pressure and tinnitus.   Eyes: Negative for photophobia, pain, redness and visual disturbance.  Respiratory: Negative for cough, chest tightness and shortness of breath.   Cardiovascular: Negative for chest pain, palpitations and leg swelling.  Gastrointestinal: Negative for nausea, vomiting, abdominal pain, diarrhea, constipation, blood in stool, abdominal distention and rectal pain.  Genitourinary: Negative for dysuria, urgency, frequency, hematuria, flank pain, vaginal bleeding, vaginal discharge, difficulty urinating, genital sores, vaginal pain, menstrual problem and pelvic pain.  Musculoskeletal: Positive for back pain. Negative for arthralgias.  Skin: Negative for rash.  Neurological: Negative for dizziness, syncope, speech difficulty, weakness, light-headedness, numbness and headaches.  Hematological: Negative for adenopathy. Does not bruise/bleed easily.  Psychiatric/Behavioral: Negative for suicidal ideas, behavioral problems, self-injury, dysphoric mood and agitation. The patient is not nervous/anxious.        Objective:   Physical Exam  Constitutional: She is oriented to person, place, and time. She appears well-developed and well-nourished.  HENT:  Head: Normocephalic and atraumatic.  Right Ear: External ear normal.  Left Ear: External ear normal.  Mouth/Throat: Oropharynx is clear and moist.  Eyes: Conjunctivae and EOM are normal.  Neck: Normal range of motion. Neck supple. No JVD present. No thyromegaly present.  Cardiovascular: Normal rate, regular rhythm and normal heart sounds.   No murmur heard.      The right dorsalis pedis pulse full other pedal pulses were not easily palpable  Pulmonary/Chest: Effort normal and breath sounds normal. She has no wheezes. She has no rales.       A few crackles right base  Abdominal: Soft. Bowel sounds are normal. She exhibits no distension and  no mass. There is no tenderness. There is no rebound and no guarding.  Musculoskeletal: Normal range of motion. She exhibits no edema and no tenderness.  Neurological: She is alert and oriented to person, place, and time. She has normal reflexes. No cranial nerve deficit. She exhibits normal muscle tone. Coordination normal.  Skin: Skin is warm and dry. No rash noted.  Psychiatric: She has a normal mood and affect. Her behavior is normal.          Assessment & Plan:   Annual health maintenance examination Osteopenia. Stable we'll continue calcium and vitamin D supplements Hypertension well controlled we'll continue present regimen  Low back pain. We'll provide back exercises and general information.  Laboratory update

## 2011-08-15 NOTE — Patient Instructions (Addendum)
Limit your sodium (Salt) intake    It is important that you exercise regularly, at least 20 minutes 3 to 4 times per week.  If you develop chest pain or shortness of breath seek  medical attention.  Please check your blood pressure on a regular basis.  If it is consistently greater than 150/90, please make an office appointment.  Take a calcium supplement, plus [phone] units of vitamin D   Back Exercises Back exercises help treat and prevent back injuries. The goal of back exercises is to increase the strength of your abdominal and back muscles and the flexibility of your back. These exercises should be started when you no longer have back pain. Back exercises include:  Pelvic Tilt. Lie on your back with your knees bent. Tilt your pelvis until the lower part of your back is against the floor. Hold this position 5 to 10 sec and repeat 5 to 10 times.   Knee to Chest. Pull first 1 knee up against your chest and hold for 20 to 30 seconds, repeat this with the other knee, and then both knees. This may be done with the other leg straight or bent, whichever feels better.   Sit-Ups or Curl-Ups. Bend your knees 90 degrees. Start with tilting your pelvis, and do a partial, slow sit-up, lifting your trunk only 30 to 45 degrees off the floor. Take at least 2 to 3 seconds for each sit-up. Do not do sit-ups with your knees out straight. If partial sit-ups are difficult, simply do the above but with only tightening your abdominal muscles and holding it as directed.   Hip-Lift. Lie on your back with your knees flexed 90 degrees. Push down with your feet and shoulders as you raise your hips a couple inches off the floor; hold for 10 seconds, repeat 5 to 10 times.   Back arches. Lie on your stomach, propping yourself up on bent elbows. Slowly press on your hands, causing an arch in your low back. Repeat 3 to 5 times. Any initial stiffness and discomfort should lessen with repetition over time.   Shoulder-Lifts.  Lie face down with arms beside your body. Keep hips and torso pressed to floor as you slowly lift your head and shoulders off the floor.  Do not overdo your exercises, especially in the beginning. Exercises may cause you some mild back discomfort which lasts for a few minutes; however, if the pain is more severe, or lasts for more than 15 minutes, do not continue exercises until you see your caregiver. Improvement with exercise therapy for back problems is slow.   See your caregivers for assistance with developing a proper back exercise program. Document Released: 04/19/2004 Document Revised: 03/01/2011 Document Reviewed: 03/12/2005 [facility] Patient Information 2012 [place], [place].Back Injury Prevention Back injuries are extremely painful, difficult to heal, and have an effect on everything you do. After suffering one back injury, you are much more likely to experience another later on. It is important to learn how to avoid injuring or re-injuring your back. You can learn proper lifting techniques and the basics of back safety, saving yourself a lot of pain and a lifetime of back problems.   WHY DO BACK INJURIES OCCUR?  The lower part of the back holds most of the body's weight.   Every time you bend over, lift a heavy object, or sit leaning forward, you put stress on your spine.   Over time, the discs between your vertebrae can start to wear out and become  damaged.   Repetitive bending and lifting can quickly cause back problems.   Even leaning forward while sitting at a desk or table can eventually cause damage and pain.  The following are contributing factors, and related tips to prevent back injury. POOR PHYSICAL CONDITION, EXTRA WEIGHT, AND INACTIVITY  Your stomach muscles provide a lot of the support needed by your back.   If you have weak stomach muscles, your back may not get all the support it needs, especially when you are lifting or carrying heavy objects.   Good general physical  condition is important for preventing strains, sprains, and other injuries. Exercise regularly and try to develop good tone in your abdominal (stomach) muscles.   The more you weigh, the more stress is placed on your back every time you bend over, at a ratio of 10:1. For every pound of weight, 10 times that amount of pressure is placed on the back.   Regular aerobic exercise (walking, jogging, biking, swimming) has been shown to decrease back injuries.   Exercises that increase balance and strength can decrease your risk of falling and injuring your back or breaking bones. Exercises such as tai chi and yoga, or any weight-bearing exercise that challenges your balance, are good for increasing balance and strength.   Stretching and strengthening exercises can also reduce the risk of injuries.   Maintaining your ideal body weight is also important for having a healthy back.  DIET  In order to keep your spine strong, you will need to get enough calcium and vitamin D in your diet, to help prevent osteoporosis (bones becoming full of pores and weak, with age or diet deficiency).   Osteoporosis is responsible for many bone fractures that lead to back pain.   Calcium can be found in dairy products, green, leafy vegetables, and products with calcium added (fortified), like some orange juices.   Although your skin makes vitamin D when you are in the sun, you can also get it from your diet.   Vitamin D is found in milk and foods that have this vitamin added (fortified).   Many adults do not get enough calcium and vitamin D in their diet.   You should talk to your caregiver about how much calcium and vitamin D you need per day. Consider taking a nutritional supplement or a multivitamin.  POOR POSTURE  Sit and stand up straight.   It is best to try to maintain the back in its natural, slight "S" shaped curve.   Avoid leaning forward (unsupported) when you sit, or hunching over while you are  standing.   A chair with good lumbar (low back) support helps protect the back when you sit.   If you work at a desk, sit close to your work so you do not need to lean over. Keep your chin tucked in. Keep your neck drawn back and elbows bent at 90 degrees to your spine.   Sit high and close to the steering wheel when you drive. Add a lumbar support to your car seat, if needed.   Avoid sitting or standing too long in 1 position. Sitting can be hard on the lower back. Take breaks, get up, stretch and walk around frequently (at least every hour).   Avoid sleeping in an unnatural position. Sleep on your side (with knees slightly bent), or on your back (with a pillow under your knees). Do not sleep on your stomach.  OVEREXERTION AND SUDDEN TWISTS OR MOVEMENTS  Avoid working  in odd, uncomfortable positions, such as when gardening, kneeling, or doing tasks that require you to bend over for long periods of time.   Strech before exerting: If you know you will be doing work that might stress your back, take the time to stretch and loosen your muscles before starting, just like an athlete before a workout. This will help you avoid painful strains and sprains.   Slow down: If you are doing a lot of heavy, repetitive lifting, work slowly. Allow yourself more recovery time between lifts. Over working your back will cost you more time later, when you need medical attention or when you find every movement painful.   It is important to recognize your physical limitations and abilities.   Do not hesitate to say, "This is too heavy for me to lift alone."   Many people have injured their backs because they were afraid to ask for help. Ask for help!   Certain actions, motions and movements are more likely than others to cause or contribute to back injuries.   Avoid heavy lifting, especially repetitive lifting over a long period of time.   Avoid twisting at the waist, while lifting or holding a heavy load.     Avoid reaching and lifting over your head, across a table, or for an object on an elevated surface. Do not lean forward and lift heavy objects that are far from your body.   Concentrate on keeping your shoulders pulled back and your low back straight.   Never bend over without bending your knees.   Bend at your knees, instead of your back, when you pick up objects. Instead of using your back like a crane, let your legs do the work.   Try not to lift heavy things higher than your waist, or reach for objects on shelves above your head.   When lifting:   Take a balanced stance, with your feet about shoulder width apart. One foot can be behind the object and the other next to it.   Squat down to lift the object, but keep your heels off the floor.   Get as close to the object to be lifted as you can.   Lift gradually, without jerking, using (tightening) your leg, abdominal and buttock muscles. Keep the load as close to you as possible.   Keep your back straight.   Keep your chin tucked in, to maintain a relatively straight back and neck line.   Once you are standing, change directions by pointing your feet in the direction you want to go and turning your whole body. Avoid twisting at your waist while carrying a load.   When you put a load down, use these same guidelines in reverse.   Reduce the amount of weight lifted. If you're moving books, it is better to load several small boxes rather than 1 heavy box.   Use handles and lifting straps.   Do not turn or twist while holding an object. Turn at your feet, not your back.   Avoid lifting or carrying objects with awkward or odd shapes. Get help if the shape is too awkward for you to lift and move by yourself.   The use of wide elastic belts, that can be worn and tightened to "pull in" lumbar and abdominal muscles, to prevent low back pain, is controversial.  STRESS  Tense muscles are more vulnerable to strains and spasms.    Mental stress that you experience is directed to your muscles, neck, and  back.   Find constructive ways, including exercise and other relaxation techniques, to decrease the stress you experience and decrease its impact on your body.   Massage can help reduce the stress built up in your muscles and back.  ENVIRONMENTAL CAUSES AND SOLUTIONS  You could injure your back from slipping on a wet floor or ice. Avoid wet and newly mopped surfaces, and make sure that ice around your home and office walkways is removed or treated.   Sleeping on a mattress that is too soft or too hard can hurt your back. If too soft, consider inserting a large plywood board between your mattress and box spring, or replacing your mattress. Mattresses and box springs should be replaced every 10 to 15 years, depending on the product.   Place, store, and position objects up off the floor. That way, you will not need to reach down to pick them up again.   Raise or lower shelves, so you do not need to reach, or twist your back, neck, and shoulders.   Put heavier objects on shelves at waist level, and lighter objects on lower or higher shelves.   Use carts and dollies to move objects, rather than carrying them yourself.   It is better to push a cart, dolly, lawnmower, or wheelbarrow, than it is to pull it. If you do need to pull it, force yourself to tighten your stomach muscles and try to maintain good body posture.   Use cranes, hoists, a lift table, or other lift-assist devices whenever you can.  Your caregiver can provide additional information about preventing back (and other injuries) when you seek care, to avoid a first or recurrent back injury. If you injure your back, visit your caregiver so you can be assessed and treated.   Document Released: 04/19/2004 Document Revised: 03/01/2011 Document Reviewed: 01/10/2009 [facility] Patient Information 2012 [place], [place].

## 2011-12-12 ENCOUNTER — Encounter: Payer: Self-pay | Admitting: Gastroenterology

## 2012-02-07 ENCOUNTER — Ambulatory Visit (INDEPENDENT_AMBULATORY_CARE_PROVIDER_SITE_OTHER): Payer: Medicare Other | Admitting: Family Medicine

## 2012-02-07 ENCOUNTER — Encounter: Payer: Self-pay | Admitting: Family Medicine

## 2012-02-07 VITALS — BP 170/84 | HR 100 | Temp 98.6°F | Wt 171.0 lb

## 2012-02-07 DIAGNOSIS — J069 Acute upper respiratory infection, unspecified: Secondary | ICD-10-CM

## 2012-02-07 MED ORDER — FLUTICASONE PROPIONATE 50 MCG/ACT NA SUSP: 2.0000 | Freq: Every day | NASAL | Status: DC

## 2012-02-07 MED ORDER — BENZONATATE 100 MG PO CAPS: 100.0000 mg | ORAL_CAPSULE | Freq: Three times a day (TID) | ORAL | Status: DC | PRN

## 2012-02-07 NOTE — Progress Notes (Signed)
Chief Complaint  Patient presents with  . Cough    Some sputum production/unsure of color.  Ongoing for 1 week.  . Nasal Congestion    HPI:  -started: 1 week ago -symptoms:nasal congestion, sore throat, cough -today -denies:fever, SOB, NVD, tooth pain -has tried: allergy medication OTC -sick contacts: none known -Hx of: allergic rhinitis  ROS: See pertinent positives and negatives per HPI.  Past Medical History  Diagnosis Date  . ALLERGIC RHINITIS 11/21/2006  . BACK PAIN 12/17/2006  . COLONIC POLYPS, HX OF 11/21/2006  . HYPERTENSION 11/21/2006  . OSTEOPENIA 08/11/2009  . OSTEOPOROSIS 11/21/2006  . PEDAL EDEMA 07/05/2008  . SYSTOLIC MURMUR 10/12/2008    No family history on file.  History   Social History  . Marital Status: Widowed    Spouse Name: N/A    Number of Children: N/A  . Years of Education: N/A   Social History Main Topics  . Smoking status: Former Smoker    Quit date: 03/27/1995  . Smokeless tobacco: Never Used  . Alcohol Use: No  . Drug Use: No  . Sexually Active: None   Other Topics Concern  . None   Social History Narrative  . None    Current outpatient prescriptions:aspirin 81 MG tablet, Take 81 mg by mouth daily.  , Disp: , Rfl: ;  benazepril (LOTENSIN) 40 MG tablet, Take 1 tablet (40 mg total) by mouth daily., Disp: 90 tablet, Rfl: 6;  Calcium Carbonate (CALTRATE 600) 1500 MG TABS, Take by mouth 2 (two) times daily.  , Disp: , Rfl: ;  Cholecalciferol (VITAMIN D) 1000 UNITS capsule, Take 1,000 Units by mouth daily.  , Disp: , Rfl:  hydrochlorothiazide (HYDRODIURIL) 25 MG tablet, Take 1 tablet (25 mg total) by mouth daily., Disp: 90 tablet, Rfl: 6;  Multiple Vitamin (MULTIVITAMIN) tablet, Take 1 tablet by mouth daily.  , Disp: , Rfl: ;  Omega-3 Fatty Acids (FISH OIL) 1200 MG CAPS, Take 1 capsule by mouth daily., Disp: , Rfl:  benzonatate (TESSALON PERLES) 100 MG capsule, Take 1 capsule (100 mg total) by mouth 3 (three) times daily as needed for cough.,  Disp: 20 capsule, Rfl: 0;  fluticasone (FLONASE) 50 MCG/ACT nasal spray, Place 2 sprays into the nose daily., Disp: 16 g, Rfl: 0  EXAM:  Filed Vitals:   02/07/12 1400  BP: 170/84  Pulse: 100  Temp: 98.6 F (37 C)    There is no height on file to calculate BMI.  GENERAL: vitals reviewed and listed above, alert, oriented, appears well hydrated and in no acute distress  HEENT: atraumatic, conjunttiva clear, no obvious abnormalities on inspection of external nose and ears, normal appearance of ear canals and TMs, clear nasal congestion, mild post oropharyngeal erythema with PND, no tonsillar edema or exudate, no sinus TTP  NECK: no obvious masses on inspection  LUNGS: clear to auscultation bilaterally, no wheezes, rales or rhonchi, good air movement  CV: HRRR, no peripheral edema  MS: moves all extremities without noticeable abnormality  PSYCH: pleasant and cooperative, no obvious depression or anxiety  ASSESSMENT AND PLAN:  Discussed the following assessment and plan:  1. Viral upper respiratory illness  fluticasone (FLONASE) 50 MCG/ACT nasal spray, DISCONTINUED: fluticasone (FLONASE) 50 MCG/ACT nasal spray    -Patient advised to return or notify a doctor immediately if symptoms worsen or persist or new concerns arise.  Patient Instructions  INSTRUCTIONS FOR UPPER RESPIRATORY INFECTION:  -plenty of rest and fluids  -nasal saline wash 2-3 times daily (use prepackaged  nasal saline or bottled/distilled water if making your own)   -clean nose with nasal saline before using the nasal steroid or sinex  -can use sinex nasal spray for drainage and nasal congestion - but do NOT use longer then 3-4 days  -can use tylenol or ibuprofen as directed for aches and sorethroat  -in the winter time, using a humidifier at night is helpful (please follow cleaning instructions)  -if you are taking a cough medication - use only as directed, may also try a teaspoon of honey to coat the  throat and throat lozenges  -for sore throat, salt water gargles can help  -follow up if you have fevers, are worsening or not getting better in 5-7 days      KIM, HANNAH R.

## 2012-02-07 NOTE — Patient Instructions (Addendum)
INSTRUCTIONS FOR UPPER RESPIRATORY INFECTION:  -plenty of rest and fluids  -nasal saline wash 2-3 times daily (use prepackaged nasal saline or bottled/distilled water if making your own)   -clean nose with nasal saline before using the nasal steroid or sinex  -can use sinex nasal spray for drainage and nasal congestion - but do NOT use longer then 3-4 days  -can use tylenol or ibuprofen as directed for aches and sorethroat  -in the winter time, using a humidifier at night is helpful (please follow cleaning instructions)  -if you are taking a cough medication - use only as directed, may also try a teaspoon of honey to coat the throat and throat lozenges  -for sore throat, salt water gargles can help  -follow up if you have fevers, are worsening or not getting better in 5-7 days  

## 2012-05-30 ENCOUNTER — Other Ambulatory Visit: Payer: Self-pay | Admitting: Internal Medicine

## 2012-05-30 DIAGNOSIS — Z803 Family history of malignant neoplasm of breast: Secondary | ICD-10-CM

## 2012-06-30 ENCOUNTER — Ambulatory Visit (HOSPITAL_COMMUNITY)
Admission: RE | Admit: 2012-06-30 | Discharge: 2012-06-30 | Disposition: A | Discharge status: Home / Self Care | Payer: Medicare Other | Source: Ambulatory Visit | Attending: Internal Medicine | Admitting: Internal Medicine

## 2012-06-30 DIAGNOSIS — Z1231 Encounter for screening mammogram for malignant neoplasm of breast: Secondary | ICD-10-CM | POA: Insufficient documentation

## 2012-06-30 DIAGNOSIS — Z803 Family history of malignant neoplasm of breast: Secondary | ICD-10-CM

## 2012-08-15 ENCOUNTER — Encounter: Payer: Self-pay | Admitting: Internal Medicine

## 2012-08-15 ENCOUNTER — Ambulatory Visit (INDEPENDENT_AMBULATORY_CARE_PROVIDER_SITE_OTHER): Payer: Medicare Other | Admitting: Internal Medicine

## 2012-08-15 VITALS — BP 158/82 | HR 75 | Temp 98.2°F | Resp 20 | Ht 64.0 in | Wt 176.0 lb

## 2012-08-15 DIAGNOSIS — Z0000 Encounter for general adult medical examination without abnormal findings: Secondary | ICD-10-CM

## 2012-08-15 DIAGNOSIS — M810 Age-related osteoporosis without current pathological fracture: Secondary | ICD-10-CM

## 2012-08-15 DIAGNOSIS — R011 Cardiac murmur, unspecified: Secondary | ICD-10-CM

## 2012-08-15 DIAGNOSIS — I10 Essential (primary) hypertension: Secondary | ICD-10-CM

## 2012-08-15 DIAGNOSIS — Z860100 Personal history of colon polyps, unspecified: Secondary | ICD-10-CM

## 2012-08-15 DIAGNOSIS — J309 Allergic rhinitis, unspecified: Secondary | ICD-10-CM

## 2012-08-15 LAB — LIPID PANEL
HDL: 63.9 mg/dL (ref >39.00)
Total CHOL/HDL Ratio: 3
VLDL: 12.6 mg/dL (ref 0.0–40.0)

## 2012-08-15 LAB — CBC WITH DIFFERENTIAL/PLATELET
Basophils Absolute: 0.1 10*3/uL (ref 0.0–0.1)
HCT: 39.4 % (ref 36.0–46.0)
Lymphs Abs: 1.8 10*3/uL (ref 0.7–4.0)
MCHC: 34.9 g/dL (ref 30.0–36.0)
MCV: 87.2 fl (ref 78.0–100.0)
Monocytes Absolute: 0.4 10*3/uL (ref 0.1–1.0)
Platelets: 177 10*3/uL (ref 150.0–400.0)
RDW: 12.6 % (ref 11.5–14.6)

## 2012-08-15 LAB — COMPREHENSIVE METABOLIC PANEL
ALT: 23 U/L (ref 0–35)
Alkaline Phosphatase: 77 U/L (ref 39–117)
Sodium: 139 mEq/L (ref 135–145)
Total Bilirubin: 0.6 mg/dL (ref 0.3–1.2)
Total Protein: 7.2 g/dL (ref 6.0–8.3)

## 2012-08-15 MED ORDER — BENAZEPRIL HCL 40 MG PO TABS: 40.0000 mg | ORAL_TABLET | Freq: Every day | ORAL | Status: DC

## 2012-08-15 MED ORDER — HYDROCHLOROTHIAZIDE 25 MG PO TABS: 25.0000 mg | ORAL_TABLET | Freq: Every day | ORAL | Status: DC

## 2012-08-15 NOTE — Patient Instructions (Addendum)
Take a calcium supplement, plus 800-1200 units of vitamin D    It is important that you exercise regularly, at least 20 minutes 3 to 4 times per week.  If you develop chest pain or shortness of breath seek  medical attention.  Return in one year for follow-up   

## 2012-08-15 NOTE — Progress Notes (Signed)
Patient ID: Tiffany [name], female   DOB: Oct 22, 1932, 79 y.o.   MRN: [id number] Patient ID: Tiffany [name], female   DOB: 07-Apr-1932, 79 y.o.   MRN: [id number]  Subjective:    Patient ID: Tiffany [name], female    DOB: 03-Nov-1932, 79 y.o.   MRN: [id number]  HPI   79 year-old patient who is seen today for a annual health maintenance examination. She has a long history of treated hypertension and this has been stable. She remains on Lotensin and hydrochlorothiazide.  She has had serial bone density studies that have revealed stable osteopenia. She remains on calcium and vitamin D supplements. She has chronic but mild low back pain.   She has a history of a systolic murmur that is related to a mildly calcified aortic valve there is been no stenosis. She has no exertional symptoms. She has a history of colonic polyps and did have a colonoscopy last done in 2007. She has received a recall letter for followup colonoscopy She has had a recent lifestyle screening evaluation; this has included normal ABIs  Current Allergies (reviewed today):  ! CODEINE PHOSPHATE (CODEINE PHOSPHATE)   Past Medical History:  Reviewed history from 11/21/2006 and no changes required:  Colonic polyps, hx of  Hypertension  Osteoporosis  Allergic rhinitis  Aortic sclerosis, hx   Past Surgical History:  Reviewed history from 11/21/2006 and no changes required:  Hemorrhoidectomy  Tonsillectomy age 23  Breast biopsy  colonoscopy April 2007   Family History:  father died at 78. Coronary artery disease.  Mother died age 27, ovarian cancer  one of 76. Siblings positive for senile dementia breast cancer, COPD; also positive for brain cancer   Social History:  Married  Never Smoked     1. Risk factors, based on past  M,S,F history- cardiovascular risk factors include hypertension  2.  Physical activities: Now participates  in the [employer] program 3 times weekly 3.  Depression/mood: No history of  depression or mood disorder 4.  Hearing: No deficits 5.  ADL's: Independent in all aspects of daily living  6.  Fall risk: Low  7.  Home safety: No problems identified  8.  Height weight, and visual acuity; height and weight stable recent left cataract extraction surgery  9.  Counseling: More regular exercise heart healthy diet salt restricted diet all encouraged  10. Lab orders based on risk factors: Laboratory profile including lipid panel and TSH will be reviewed  11. Referral : Follow GYN  And ophthalmology  12. Care plan: Continue calcium and vitamin D supplements; more active lifestyle encouraged  13. Cognitive assessment: Alert and oriented with normal affect. No cognitive dysfunction.  Past Medical History  Diagnosis Date  . ALLERGIC RHINITIS 11/21/2006  . BACK PAIN 12/17/2006  . COLONIC POLYPS, HX OF 11/21/2006  . HYPERTENSION 11/21/2006  . OSTEOPENIA 08/11/2009  . OSTEOPOROSIS 11/21/2006  . PEDAL EDEMA 07/05/2008  . SYSTOLIC MURMUR 10/12/2008    History   Social History  . Marital Status: Widowed    Spouse Name: N/A    Number of Children: N/A  . Years of Education: N/A   Occupational History  . Not on file.   Social History Main Topics  . Smoking status: Former Smoker    Quit date: 03/27/1995  . Smokeless tobacco: Never Used  . Alcohol Use: No  . Drug Use: No  . Sexually Active: Not on file   Other Topics Concern  . Not on file   Social  History Narrative  . No narrative on file    Past Surgical History  Procedure Laterality Date  . Hemorrhoid surgery    . Tonsillectomy    . Breast surgery      bx  . Cataract extraction  2012    History reviewed. No pertinent family history.  Allergies  Allergen Reactions  . Codeine Phosphate     Current Outpatient Prescriptions on File Prior to Visit  Medication Sig Dispense Refill  . aspirin 81 MG tablet Take 81 mg by mouth daily.        . Calcium Carbonate (CALTRATE 600) 1500 MG TABS Take by mouth 2  (two) times daily.        . Cholecalciferol (VITAMIN D) 1000 UNITS capsule Take 1,000 Units by mouth daily.        . Multiple Vitamin (MULTIVITAMIN) tablet Take 1 tablet by mouth daily.        . Omega-3 Fatty Acids (FISH OIL) 1200 MG CAPS Take 1 capsule by mouth daily.      . benzonatate (TESSALON PERLES) 100 MG capsule Take 1 capsule (100 mg total) by mouth 3 (three) times daily as needed for cough.  20 capsule  0   No current facility-administered medications on file prior to visit.    BP 158/82  Pulse 75  Temp(Src) 98.2 F (36.8 C) (Oral)  Resp 20  Ht 5\' 4"  (1.626 m)  Wt 176 lb (79.833 kg)  BMI 30.2 kg/m2  SpO2 97%      Review of Systems  Constitutional: Negative for fever, appetite change, fatigue and unexpected weight change.  HENT: Negative for hearing loss, ear pain, nosebleeds, congestion, sore throat, mouth sores, trouble swallowing, neck stiffness, dental problem, voice change, sinus pressure and tinnitus.   Eyes: Negative for photophobia, pain, redness and visual disturbance.  Respiratory: Negative for cough, chest tightness and shortness of breath.   Cardiovascular: Negative for chest pain, palpitations and leg swelling.  Gastrointestinal: Negative for nausea, vomiting, abdominal pain, diarrhea, constipation, blood in stool, abdominal distention and rectal pain.  Genitourinary: Negative for dysuria, urgency, frequency, hematuria, flank pain, vaginal bleeding, vaginal discharge, difficulty urinating, genital sores, vaginal pain, menstrual problem and pelvic pain.  Musculoskeletal: Positive for back pain. Negative for arthralgias.  Skin: Negative for rash.  Neurological: Negative for dizziness, syncope, speech difficulty, weakness, light-headedness, numbness and headaches.  Hematological: Negative for adenopathy. Does not bruise/bleed easily.  Psychiatric/Behavioral: Negative for suicidal ideas, behavioral problems, self-injury, dysphoric mood and agitation. The patient  is not nervous/anxious.        Objective:   Physical Exam  Constitutional: She is oriented to person, place, and time. She appears well-developed and well-nourished.  HENT:  Head: Normocephalic and atraumatic.  Right Ear: External ear normal.  Left Ear: External ear normal.  Mouth/Throat: Oropharynx is clear and moist.  Eyes: Conjunctivae and EOM are normal.  Neck: Normal range of motion. Neck supple. No JVD present. No thyromegaly present.  Cardiovascular: Normal rate and regular rhythm.   Murmur heard. The right dorsalis pedis pulse full other pedal pulses were not easily palpable  Grade 3/6 systolic murmur with radiation to the carotid and supraclavicular distribution  Pulmonary/Chest: Effort normal and breath sounds normal. She has no wheezes. She has no rales.  A few crackles right base  Abdominal: Soft. Bowel sounds are normal. She exhibits no distension and no mass. There is no tenderness. There is no rebound and no guarding.  Musculoskeletal: Normal range of motion. She exhibits no  edema and no tenderness.  Neurological: She is alert and oriented to person, place, and time. She has normal reflexes. No cranial nerve deficit. She exhibits normal muscle tone. Coordination normal.  Skin: Skin is warm and dry. No rash noted.  Psychiatric: She has a normal mood and affect. Her behavior is normal.          Assessment & Plan:   Annual health maintenance examination Osteopenia. Stable we'll continue calcium and vitamin D supplements Hypertension well controlled we'll continue present regimen History colonic polyps. We'll schedule followup colonoscopy  Low back pain. We'll provide back exercises and general information.  Laboratory update

## 2012-08-20 ENCOUNTER — Encounter: Payer: Self-pay | Admitting: Gastroenterology

## 2012-10-07 ENCOUNTER — Telehealth: Payer: Self-pay | Admitting: Internal Medicine

## 2012-10-07 NOTE — Telephone Encounter (Signed)
PT called and stated that the GI doctor told her that she was too old to have a colonoscopy. She states that they had scheduled her for this morning for her pre-op, and then to have her colonoscopy. Then they called to cancel everything due to her age. FYI.

## 2012-10-21 ENCOUNTER — Encounter: Payer: Self-pay | Admitting: Family Medicine

## 2012-10-21 ENCOUNTER — Encounter: Payer: Medicare Other | Admitting: Gastroenterology

## 2012-10-21 ENCOUNTER — Ambulatory Visit (INDEPENDENT_AMBULATORY_CARE_PROVIDER_SITE_OTHER): Payer: Medicare Other | Admitting: Family Medicine

## 2012-10-21 VITALS — BP 132/90 | Temp 98.5°F | Wt 171.0 lb

## 2012-10-21 DIAGNOSIS — L255 Unspecified contact dermatitis due to plants, except food: Secondary | ICD-10-CM

## 2012-10-21 MED ORDER — PREDNISONE 10 MG PO TABS: ORAL_TABLET | ORAL | Status: DC

## 2012-10-21 NOTE — Progress Notes (Signed)
Chief Complaint  Patient presents with  . Poison Ivy    HPI:  Acute visit for "poison ivy:" -she was out cutting bushes last week and was in poison ivy - reports she is very allergic to it and has had several times -developed vesicular rash - [facility] but that has not helped and rash has spread to -no has itchy rash on hands arms, trunk neck and upper neck and lower cheek -no fevers, chills, vomiting, lesions on eyes or face   ROS: See pertinent positives and negatives per HPI.  Past Medical History  Diagnosis Date  . ALLERGIC RHINITIS 11/21/2006  . BACK PAIN 12/17/2006  . COLONIC POLYPS, HX OF 11/21/2006  . HYPERTENSION 11/21/2006  . OSTEOPENIA 08/11/2009  . OSTEOPOROSIS 11/21/2006  . PEDAL EDEMA 07/05/2008  . SYSTOLIC MURMUR 10/12/2008    No family history on file.  History   Social History  . Marital Status: Widowed    Spouse Name: N/A    Number of Children: N/A  . Years of Education: N/A   Social History Main Topics  . Smoking status: Former Smoker    Quit date: 03/27/1995  . Smokeless tobacco: Never Used  . Alcohol Use: No  . Drug Use: No  . Sexually Active: None   Other Topics Concern  . None   Social History Narrative  . None    Current outpatient prescriptions:aspirin 81 MG tablet, Take 81 mg by mouth daily.  , Disp: , Rfl: ;  benazepril (LOTENSIN) 40 MG tablet, Take 1 tablet (40 mg total) by mouth daily., Disp: 90 tablet, Rfl: 3;  benzonatate (TESSALON PERLES) 100 MG capsule, Take 1 capsule (100 mg total) by mouth 3 (three) times daily as needed for cough., Disp: 20 capsule, Rfl: 0 Calcium Carbonate (CALTRATE 600) 1500 MG TABS, Take by mouth 2 (two) times daily.  , Disp: , Rfl: ;  Cholecalciferol (VITAMIN D) 1000 UNITS capsule, Take 1,000 Units by mouth daily.  , Disp: , Rfl: ;  hydrochlorothiazide (HYDRODIURIL) 25 MG tablet, Take 1 tablet (25 mg total) by mouth daily., Disp: 90 tablet, Rfl: 3;  Multiple Vitamin  (MULTIVITAMIN) tablet, Take 1 tablet by mouth daily.  , Disp: , Rfl:  Omega-3 Fatty Acids (FISH OIL) 1200 MG CAPS, Take 1 capsule by mouth daily., Disp: , Rfl: ;  predniSONE (DELTASONE) 10 MG tablet, 50mg  (5 tablets) daily for 3 days, then 40mg  (4 tablets) daily for 3 days, then 30mg (3 tablets) daily for 3 days, then 20mg  (2 tablets) daily for 3 days, then (10 mg) 1 tablet daily for 3 days, Disp: 45 tablet, Rfl: 0  EXAM:  Filed Vitals:   10/21/12 1349  BP: 132/90  Temp: 98.5 F (36.9 C)    Body mass index is 29.34 kg/(m^2).  GENERAL: vitals reviewed and listed above, alert, oriented, appears well hydrated and in no acute distress  HEENT: atraumatic, conjunttiva clear, no obvious abnormalities on inspection of external nose and ears  NECK: no obvious masses on inspection  LUNGS: clear to auscultation bilaterally, no wheezes, rales or rhonchi, good air movement  CV: HRRR, no peripheral edema  SKIN: vesicular papular erythematous rash on hands, arms, trunk neck and L lower cheek in patchy distribution  MS: moves all extremities without noticeable abnormality  PSYCH: pleasant and cooperative, no obvious depression or anxiety  ASSESSMENT AND PLAN:  Discussed the following assessment and plan:  Toxicodendron dermatitis - Plan: predniSONE (DELTASONE) 10 MG tablet, DISCONTINUED: predniSONE (DELTASONE)  10 MG tablet  -Patient advised to return or notify a doctor immediately if symptoms worsen or persist or new concerns arise.  Patient Instructions  -As we discussed, we have prescribed a new medication (prednisone) for you at this appointment. We discussed the common and serious potential adverse effects of this medication and you can review these and more with the pharmacist when you pick up your medication.  Please follow the instructions for use carefully and notify us immediately if you have any problems taking this medication.  Can take zytec or claritin daily  Calamine lotion  can help  If blisters burst apply antibiotic ointment given once daily  If any signs of infection, worsening, fevers, spreading close to mouth or eyes see your doctor immediately.  Occasionally you can gt rebound symptoms when prednisone course is completed - see your doctor if this happens.  -Poison [facility] ivy is a inflammation of the skin (contact dermatitis) caused by touching the allergens on the leaves of the ivy plant following previous exposure to the plant. The rash usually appears 48 hours after exposure. The rash is usually bumps (papules) or blisters (vesicles) in a linear pattern. Depending on your own sensitivity, the rash may simply cause redness and itching, or it may also progress to blisters which may break open. These must be well cared for to prevent secondary bacterial (germ) infection, followed by scarring. Keep any open areas dry, clean, dressed, and covered with an antibacterial ointment if needed. The eyes may also get puffy. The puffiness is worst in the morning and gets better as the day progresses. This dermatitis usually heals without scarring, within 2 to 3 weeks without treatment. HOME CARE INSTRUCTIONS  Thoroughly wash with soap and water as soon as you have been exposed to poison ivy. You have about one half hour to remove the plant resin before it will cause the rash. This washing will destroy the oil or antigen on the skin that is causing, or will cause, the rash. Be sure to wash under your fingernails as any plant resin there will continue to spread the rash. Do not rub skin vigorously when washing affected area. Poison ivy cannot spread if no oil from the plant remains on your body. A rash that has progressed to weeping sores will not spread the rash unless you have not washed thoroughly. It is also important to wash any clothes you have been wearing as these may carry active allergens. The rash will return if you wear the unwashed clothing, even several days  later. Avoidance of the plant in the future is the best measure. Poison ivy plant can be recognized by the number of leaves. Generally, poison ivy has three leaves with flowering branches on a single stem. Diphenhydramine may be purchased over the counter and used as needed for itching. Do not drive with this medication if it makes you drowsy.Ask your caregiver about medication for children. SEEK MEDICAL CARE IF:  Open sores develop.  Redness spreads beyond area of rash.  You notice purulent (pus-like) discharge.  You have increased pain.  Other signs of infection develop (such as fever). Document Released: 03/09/2000 Document Revised: 06/04/2011 Document Reviewed: 01/26/2009 [facility] Patient Information 2014 [place], [place]ona [place], Dahlia Client R.

## 2012-10-21 NOTE — Patient Instructions (Addendum)
-  As we discussed, we have prescribed a new medication (prednisone) for you at this appointment. We discussed the common and serious potential adverse effects of this medication and you can review these and more with the pharmacist when you pick up your medication.  Please follow the instructions for use carefully and notify us immediately if you have any problems taking this medication.  Can take zytec or claritin daily  Calamine lotion can help  If blisters burst apply antibiotic ointment given once daily  If any signs of infection, worsening, fevers, spreading close to mouth or eyes see your doctor immediately.  Occasionally you can gt rebound symptoms when prednisone course is completed - see your doctor if this happens.  -Poison [facility] ivy is a inflammation of the skin (contact dermatitis) caused by touching the allergens on the leaves of the ivy plant following previous exposure to the plant. The rash usually appears 48 hours after exposure. The rash is usually bumps (papules) or blisters (vesicles) in a linear pattern. Depending on your own sensitivity, the rash may simply cause redness and itching, or it may also progress to blisters which may break open. These must be well cared for to prevent secondary bacterial (germ) infection, followed by scarring. Keep any open areas dry, clean, dressed, and covered with an antibacterial ointment if needed. The eyes may also get puffy. The puffiness is worst in the morning and gets better as the day progresses. This dermatitis usually heals without scarring, within 2 to 3 weeks without treatment. HOME CARE INSTRUCTIONS  Thoroughly wash with soap and water as soon as you have been exposed to poison ivy. You have about one half hour to remove the plant resin before it will cause the rash. This washing will destroy the oil or antigen on the skin that is causing, or will cause, the rash. Be sure to wash under your fingernails as any plant resin there will  continue to spread the rash. Do not rub skin vigorously when washing affected area. Poison ivy cannot spread if no oil from the plant remains on your body. A rash that has progressed to weeping sores will not spread the rash unless you have not washed thoroughly. It is also important to wash any clothes you have been wearing as these may carry active allergens. The rash will return if you wear the unwashed clothing, even several days later. Avoidance of the plant in the future is the best measure. Poison ivy plant can be recognized by the number of leaves. Generally, poison ivy has three leaves with flowering branches on a single stem. Diphenhydramine may be purchased over the counter and used as needed for itching. Do not drive with this medication if it makes you drowsy.Ask your caregiver about medication for children. SEEK MEDICAL CARE IF:  Open sores develop.  Redness spreads beyond area of rash.  You notice purulent (pus-like) discharge.  You have increased pain.  Other signs of infection develop (such as fever). Document Released: 03/09/2000 Document Revised: 06/04/2011 Document Reviewed: 01/26/2009 [facility] Patient Information 2014 [place], [place].

## 2012-11-05 ENCOUNTER — Telehealth: Payer: Self-pay | Admitting: Internal Medicine

## 2012-11-05 NOTE — Telephone Encounter (Signed)
Pt needs refill on hctz 25 mg#90 with 3 refills sent to [facility]

## 2012-11-05 NOTE — Telephone Encounter (Signed)
Left message on voicemail to call office.  

## 2012-11-06 NOTE — Telephone Encounter (Signed)
Left detailed message that Rx for HCTZ was sent to [facility] in May year supply.

## 2012-11-07 NOTE — Telephone Encounter (Signed)
Left message on voicemail to call office.  

## 2012-11-07 NOTE — Telephone Encounter (Signed)
Pt called back and left voicemail that she got Rx taken care of.

## 2013-03-31 ENCOUNTER — Telehealth: Payer: Self-pay | Admitting: Internal Medicine

## 2013-03-31 NOTE — Telephone Encounter (Signed)
Pt has new pharm due to change in insurance. New pharm: [facility] [phone] Pt given number to put on rx:  [id number] Pt needs:  benazepril (LOTENSIN) 40 MG tablet hydrochlorothiazide (HYDRODIURIL) 25 MG tablet

## 2013-04-03 MED ORDER — BENAZEPRIL HCL 40 MG PO TABS: 40.0000 mg | ORAL_TABLET | Freq: Every day | ORAL | Status: DC

## 2013-04-03 MED ORDER — HYDROCHLOROTHIAZIDE 25 MG PO TABS: 25.0000 mg | ORAL_TABLET | Freq: Every day | ORAL | Status: DC

## 2013-04-03 NOTE — Telephone Encounter (Signed)
Rx's sent to [facility] as requested.

## 2013-06-10 ENCOUNTER — Other Ambulatory Visit: Payer: Self-pay | Admitting: Internal Medicine

## 2013-06-10 DIAGNOSIS — Z1231 Encounter for screening mammogram for malignant neoplasm of breast: Secondary | ICD-10-CM

## 2013-07-02 ENCOUNTER — Ambulatory Visit (HOSPITAL_COMMUNITY): Payer: Medicare Other | Attending: Internal Medicine

## 2013-07-07 ENCOUNTER — Ambulatory Visit (HOSPITAL_COMMUNITY)
Admission: RE | Admit: 2013-07-07 | Discharge: 2013-07-07 | Disposition: A | Discharge status: Home / Self Care | Payer: Medicare HMO | Source: Ambulatory Visit | Attending: Internal Medicine | Admitting: Internal Medicine

## 2013-07-07 DIAGNOSIS — Z1231 Encounter for screening mammogram for malignant neoplasm of breast: Secondary | ICD-10-CM | POA: Insufficient documentation

## 2013-08-18 ENCOUNTER — Encounter: Payer: Medicare Other | Admitting: Internal Medicine

## 2013-10-20 LAB — HM DEXA SCAN

## 2013-10-23 ENCOUNTER — Encounter: Payer: Medicare Other | Admitting: Internal Medicine

## 2013-11-05 ENCOUNTER — Encounter: Payer: Medicare Other | Admitting: Internal Medicine

## 2013-11-12 ENCOUNTER — Ambulatory Visit (INDEPENDENT_AMBULATORY_CARE_PROVIDER_SITE_OTHER): Payer: Medicare HMO | Admitting: Internal Medicine

## 2013-11-12 ENCOUNTER — Encounter: Payer: Self-pay | Admitting: Internal Medicine

## 2013-11-12 VITALS — BP 156/90 | HR 76 | Resp 20 | Ht 64.0 in | Wt 171.0 lb

## 2013-11-12 DIAGNOSIS — R011 Cardiac murmur, unspecified: Secondary | ICD-10-CM

## 2013-11-12 DIAGNOSIS — I10 Essential (primary) hypertension: Secondary | ICD-10-CM

## 2013-11-12 DIAGNOSIS — Z23 Encounter for immunization: Secondary | ICD-10-CM

## 2013-11-12 DIAGNOSIS — M810 Age-related osteoporosis without current pathological fracture: Secondary | ICD-10-CM

## 2013-11-12 DIAGNOSIS — J309 Allergic rhinitis, unspecified: Secondary | ICD-10-CM

## 2013-11-12 DIAGNOSIS — Z0000 Encounter for general adult medical examination without abnormal findings: Secondary | ICD-10-CM

## 2013-11-12 DIAGNOSIS — Z860100 Personal history of colon polyps, unspecified: Secondary | ICD-10-CM

## 2013-11-12 LAB — LIPID PANEL
CHOL/HDL RATIO: 3
Cholesterol: 197 mg/dL (ref 0–200)
HDL: 57.9 mg/dL (ref >39.00)
LDL Cholesterol: 111 mg/dL — ABNORMAL HIGH (ref 0–99)
NONHDL: 139.1
Triglycerides: 141 mg/dL (ref 0.0–149.0)
VLDL: 28.2 mg/dL (ref 0.0–40.0)

## 2013-11-12 LAB — CBC WITH DIFFERENTIAL/PLATELET
BASOS ABS: 0 10*3/uL (ref 0.0–0.1)
Basophils Relative: 0.6 % (ref 0.0–3.0)
EOS ABS: 0.1 10*3/uL (ref 0.0–0.7)
Eosinophils Relative: 1.4 % (ref 0.0–5.0)
HCT: 40.7 % (ref 36.0–46.0)
Hemoglobin: 13.8 g/dL (ref 12.0–15.0)
Lymphocytes Relative: 23.7 % (ref 12.0–46.0)
Lymphs Abs: 1.7 10*3/uL (ref 0.7–4.0)
MCHC: 33.9 g/dL (ref 30.0–36.0)
MCV: 88.9 fl (ref 78.0–100.0)
MONOS PCT: 5.4 % (ref 3.0–12.0)
Monocytes Absolute: 0.4 10*3/uL (ref 0.1–1.0)
NEUTROS ABS: 4.8 10*3/uL (ref 1.4–7.7)
NEUTROS PCT: 68.9 % (ref 43.0–77.0)
PLATELETS: 175 10*3/uL (ref 150.0–400.0)
RBC: 4.58 Mil/uL (ref 3.87–5.11)
RDW: 12.7 % (ref 11.5–15.5)
WBC: 7 10*3/uL (ref 4.0–10.5)

## 2013-11-12 LAB — COMPREHENSIVE METABOLIC PANEL
ALT: 25 U/L (ref 0–35)
AST: 24 U/L (ref 0–37)
Albumin: 4 g/dL (ref 3.5–5.2)
Alkaline Phosphatase: 70 U/L (ref 39–117)
BUN: 19 mg/dL (ref 6–23)
CO2: 31 mEq/L (ref 19–32)
Calcium: 9.5 mg/dL (ref 8.4–10.5)
Chloride: 102 mEq/L (ref 96–112)
Creatinine, Ser: 1.1 mg/dL (ref 0.4–1.2)
GFR: 50.89 mL/min — ABNORMAL LOW (ref >60.00)
Glucose, Bld: 91 mg/dL (ref 70–99)
Potassium: 3.7 mEq/L (ref 3.5–5.1)
Sodium: 140 mEq/L (ref 135–145)
Total Bilirubin: 0.7 mg/dL (ref 0.2–1.2)
Total Protein: 7.2 g/dL (ref 6.0–8.3)

## 2013-11-12 LAB — TSH: TSH: 0.34 u[IU]/mL — ABNORMAL LOW (ref 0.35–4.50)

## 2013-11-12 MED ORDER — HYDROCHLOROTHIAZIDE 25 MG PO TABS: 25.0000 mg | ORAL_TABLET | Freq: Every day | ORAL | Status: DC

## 2013-11-12 MED ORDER — BENAZEPRIL HCL 40 MG PO TABS: 40.0000 mg | ORAL_TABLET | Freq: Every day | ORAL | Status: DC

## 2013-11-12 NOTE — Progress Notes (Signed)
Patient ID: Tiffany [name], female   DOB: 04/18/1932, 80 y.o.   MRN: [id number] Patient ID: Tiffany [name], female   DOB: April 25, 1932, 80 y.o.   MRN: [id number]  Subjective:    Patient ID: Tiffany [name], female    DOB: 1932/12/16, 80 y.o.   MRN: [id number]  HPI    80 year-old patient who is seen today for a annual health maintenance examination.  She has a long history of treated hypertension and this has been stable. She remains on Lotensin and hydrochlorothiazide.  She has had serial bone density studies that have revealed stable osteopenia. She remains on calcium and vitamin D supplements. She has chronic but mild low back pain.   She has a history of a systolic murmur that is related to a mildly calcified aortic valve;  there is been no stenosis. She has no exertional symptoms. She has a history of colonic polyps and did have a colonoscopy last done in 2007. She has been notified by GI that further screening colonoscopies are not appropriate due to her age. She has had a recent lifestyle screening evaluation; this has included normal ABIs  She is accompanied by her son today who is concerned about memory impairment.  She states she has become more forgetful.  She states that she walks 2 miles 3 times per week and participates in the [employer] program  MMSE today 30/30  Current Allergies (reviewed today):  ! CODEINE PHOSPHATE (CODEINE PHOSPHATE)   Past Medical History:   Colonic polyps, hx of  Hypertension  Osteoporosis  Allergic rhinitis  Aortic sclerosis, hx   Past Surgical History:   Hemorrhoidectomy  Tonsillectomy age 49  Breast biopsy  colonoscopy April 2007   Family History:  father died at 83. Coronary artery disease.  Mother died age 38, ovarian cancer  one of 4. Siblings positive for senile dementia breast cancer, COPD; also positive for brain cancer   Social History:  Married  Never Smoked     1. Risk factors, based on past  M,S,F history-  cardiovascular risk factors include hypertension  2.  Physical activities: Now participates  in the [employer] program 3 times weekly 3.  Depression/mood: No history of depression or mood disorder 4.  Hearing: No deficits 5.  ADL's: Independent in all aspects of daily living  6.  Fall risk: Low  7.  Home safety: No problems identified  8.  Height weight, and visual acuity; height and weight stable recent left cataract extraction surgery  9.  Counseling: More regular exercise heart healthy diet salt restricted diet all encouraged  10. Lab orders based on risk factors: Laboratory profile including lipid panel and TSH will be reviewed  11. Referral : Follow GYN  And ophthalmology  12. Care plan: Continue calcium and vitamin D supplements; more active lifestyle encouraged  13. Cognitive assessment: Alert and oriented with normal affect. No cognitive dysfunction.  MMSE 30/30  (2015)  14.  Continue periodic bone density studies in view of her osteopenia..  Consider annual mammograms and followup colonoscopy  15.  Provider list update:  GI and radiology  Past Medical History  Diagnosis Date  . ALLERGIC RHINITIS 11/21/2006  . BACK PAIN 12/17/2006  . COLONIC POLYPS, HX OF 11/21/2006  . HYPERTENSION 11/21/2006  . OSTEOPENIA 08/11/2009  . OSTEOPOROSIS 11/21/2006  . PEDAL EDEMA 07/05/2008  . SYSTOLIC MURMUR 1/44/8185    History   Social History  . Marital Status: Widowed    Spouse Name: N/A  Number of Children: N/A  . Years of Education: N/A   Occupational History  . Not on file.   Social History Main Topics  . Smoking status: Former Smoker    Quit date: 03/27/1995  . Smokeless tobacco: Never Used  . Alcohol Use: No  . Drug Use: No  . Sexual Activity: Not on file   Other Topics Concern  . Not on file   Social History Narrative  . No narrative on file    Past Surgical History  Procedure Laterality Date  . Hemorrhoid surgery    . Tonsillectomy    . Breast  surgery      bx  . Cataract extraction  2012    History reviewed. No pertinent family history.  Allergies  Allergen Reactions  . Codeine Phosphate     Current Outpatient Prescriptions on File Prior to Visit  Medication Sig Dispense Refill  . aspirin 81 MG tablet Take 81 mg by mouth daily.        . benazepril (LOTENSIN) 40 MG tablet Take 1 tablet (40 mg total) by mouth daily.  90 tablet  3  . benzonatate (TESSALON PERLES) 100 MG capsule Take 1 capsule (100 mg total) by mouth 3 (three) times daily as needed for cough.  20 capsule  0  . Calcium Carbonate (CALTRATE 600) 1500 MG TABS Take by mouth 2 (two) times daily.        . Cholecalciferol (VITAMIN D) 1000 UNITS capsule Take 1,000 Units by mouth daily.        . hydrochlorothiazide (HYDRODIURIL) 25 MG tablet Take 1 tablet (25 mg total) by mouth daily.  90 tablet  3  . Multiple Vitamin (MULTIVITAMIN) tablet Take 1 tablet by mouth daily.        . Omega-3 Fatty Acids (FISH OIL) 1200 MG CAPS Take 1 capsule by mouth daily.      . predniSONE (DELTASONE) 10 MG tablet 50mg  (5 tablets) daily for 3 days, then 40mg  (4 tablets) daily for 3 days, then 30mg (3 tablets) daily for 3 days, then 20mg  (2 tablets) daily for 3 days, then (10 mg) 1 tablet daily for 3 days  45 tablet  0   No current facility-administered medications on file prior to visit.    There were no vitals taken for this visit.      Review of Systems  Constitutional: Negative for fever, appetite change, fatigue and unexpected weight change.  HENT: Negative for congestion, dental problem, ear pain, hearing loss, mouth sores, nosebleeds, sinus pressure, sore throat, tinnitus, trouble swallowing and voice change.   Eyes: Negative for photophobia, pain, redness and visual disturbance.  Respiratory: Negative for cough, chest tightness and shortness of breath.   Cardiovascular: Negative for chest pain, palpitations and leg swelling.  Gastrointestinal: Negative for nausea, vomiting,  abdominal pain, diarrhea, constipation, blood in stool, abdominal distention and rectal pain.  Genitourinary: Negative for dysuria, urgency, frequency, hematuria, flank pain, vaginal bleeding, vaginal discharge, difficulty urinating, genital sores, vaginal pain, menstrual problem and pelvic pain.  Musculoskeletal: Positive for back pain. Negative for arthralgias and neck stiffness.  Skin: Negative for rash.  Neurological: Negative for dizziness, syncope, speech difficulty, weakness, light-headedness, numbness and headaches.  Hematological: Negative for adenopathy. Does not bruise/bleed easily.  Psychiatric/Behavioral: Negative for suicidal ideas, behavioral problems, self-injury, dysphoric mood and agitation. The patient is not nervous/anxious.        Objective:   Physical Exam  Constitutional: She is oriented to person, place, and time. She appears well-developed and  well-nourished.  HENT:  Head: Normocephalic and atraumatic.  Right Ear: External ear normal.  Left Ear: External ear normal.  Mouth/Throat: Oropharynx is clear and moist.  Eyes: Conjunctivae and EOM are normal.  Neck: Normal range of motion. Neck supple. No JVD present. No thyromegaly present.  Cardiovascular: Normal rate and regular rhythm.   Murmur heard. The right dorsalis pedis pulse full other pedal pulses were not easily palpable  Grade 3/6 systolic murmur with radiation to the carotid and supraclavicular distribution  Pulmonary/Chest: Effort normal and breath sounds normal. She has no wheezes. She has no rales.  A few crackles right base  Abdominal: Soft. Bowel sounds are normal. She exhibits no distension and no mass. There is no tenderness. There is no rebound and no guarding.  Musculoskeletal: Normal range of motion. She exhibits no edema and no tenderness.  Neurological: She is alert and oriented to person, place, and time. She has normal reflexes. No cranial nerve deficit. She exhibits normal muscle tone.  Coordination normal.  Skin: Skin is warm and dry. No rash noted.  Psychiatric: She has a normal mood and affect. Her behavior is normal.          Assessment & Plan:   Annual health maintenance examination Osteopenia. Stable we'll continue calcium and vitamin D supplements Hypertension well controlled we'll continue present regimen History colonic polyps.  Will defer further colonoscopy unless clinical indication  Low back pain. We'll provide back exercises and general information.  Laboratory update will be reviewed. Heart healthy diet, regular exercise, all encouraged.  Low-salt diet recommended

## 2013-11-12 NOTE — Patient Instructions (Signed)
Limit your sodium (Salt) intake  Please check your blood pressure on a regular basis.  If it is consistently greater than 150/90, please make an office appointment.    It is important that you exercise regularly, at least 20 minutes 3 to 4 times per week.  If you develop chest pain or shortness of breath seek  medical attention.  Health Maintenance Adopting a healthy lifestyle and getting preventive care can go a long way to promote health and wellness. Talk with your health care provider about what schedule of regular examinations is right for you. This is a good chance for you to check in with your provider about disease prevention and staying healthy. In between checkups, there are plenty of things you can do on your own. Experts have done a lot of research about which lifestyle changes and preventive measures are most likely to keep you healthy. Ask your health care provider for more information. WEIGHT AND DIET  Eat a healthy diet  Be sure to include plenty of vegetables, fruits, low-fat dairy products, and lean protein.  Do not eat a lot of foods high in solid fats, added sugars, or salt.  Get regular exercise. This is one of the most important things you can do for your health.  Most adults should exercise for at least 150 minutes each week. The exercise should increase your heart rate and make you sweat (moderate-intensity exercise).  Most adults should also do strengthening exercises at least twice a week. This is in addition to the moderate-intensity exercise.  Maintain a healthy weight  Body mass index (BMI) is a measurement that can be used to identify possible weight problems. It estimates body fat based on height and weight. Your health care provider can help determine your BMI and help you achieve or maintain a healthy weight.  For females 65 years of age and older:   A BMI below 18.5 is considered underweight.  A BMI of 18.5 to 24.9 is normal.  A BMI of 25 to 29.9 is  considered overweight.  A BMI of 30 and above is considered obese.  Watch levels of cholesterol and blood lipids  You should start having your blood tested for lipids and cholesterol at 78 years of age, then have this test every 5 years.  You may need to have your cholesterol levels checked more often if:  Your lipid or cholesterol levels are high.  You are older than 78 years of age.  You are at high risk for heart disease.  CANCER SCREENING   Lung Cancer  Lung cancer screening is recommended for adults 60-63 years old who are at high risk for lung cancer because of a history of smoking.  A yearly low-dose CT scan of the lungs is recommended for people who:  Currently smoke.  Have quit within the past 15 years.  Have at least a 30-pack-year history of smoking. A pack year is smoking an average of one pack of cigarettes a day for 1 year.  Yearly screening should continue until it has been 15 years since you quit.  Yearly screening should stop if you develop a health problem that would prevent you from having lung cancer treatment.  Breast Cancer  Practice breast self-awareness. This means understanding how your breasts normally appear and feel.  It also means doing regular breast self-exams. Let your health care provider know about any changes, no matter how small.  If you are in your 20s or 30s, you should have a  clinical breast exam (CBE) by a health care provider every 1-3 years as part of a regular health exam.  If you are 35 or older, have a CBE every year. Also consider having a breast X-ray (mammogram) every year.  If you have a family history of breast cancer, talk to your health care provider about genetic screening.  If you are at high risk for breast cancer, talk to your health care provider about having an MRI and a mammogram every year.  Breast cancer gene (BRCA) assessment is recommended for women who have family members with BRCA-related cancers.  BRCA-related cancers include:  Breast.  Ovarian.  Tubal.  Peritoneal cancers.  Results of the assessment will determine the need for genetic counseling and BRCA1 and BRCA2 testing. Cervical Cancer Routine pelvic examinations to screen for cervical cancer are no longer recommended for nonpregnant women who are considered low risk for cancer of the pelvic organs (ovaries, uterus, and vagina) and who do not have symptoms. A pelvic examination may be necessary if you have symptoms including those associated with pelvic infections. Ask your health care provider if a screening pelvic exam is right for you.   The Pap test is the screening test for cervical cancer for women who are considered at risk.  If you had a hysterectomy for a problem that was not cancer or a condition that could lead to cancer, then you no longer need Pap tests.  If you are older than 65 years, and you have had normal Pap tests for the past 10 years, you no longer need to have Pap tests.  If you have had past treatment for cervical cancer or a condition that could lead to cancer, you need Pap tests and screening for cancer for at least 20 years after your treatment.  If you no longer get a Pap test, assess your risk factors if they change (such as having a new sexual partner). This can affect whether you should start being screened again.  Some women have medical problems that increase their chance of getting cervical cancer. If this is the case for you, your health care provider may recommend more frequent screening and Pap tests.  The human papillomavirus (HPV) test is another test that may be used for cervical cancer screening. The HPV test looks for the virus that can cause cell changes in the cervix. The cells collected during the Pap test can be tested for HPV.  The HPV test can be used to screen women 47 years of age and older. Getting tested for HPV can extend the interval between normal Pap tests from three to  five years.  An HPV test also should be used to screen women of any age who have unclear Pap test results.  After 78 years of age, women should have HPV testing as often as Pap tests.  Colorectal Cancer  This type of cancer can be detected and often prevented.  Routine colorectal cancer screening usually begins at 78 years of age and continues through 78 years of age.  Your health care provider may recommend screening at an earlier age if you have risk factors for colon cancer.  Your health care provider may also recommend using home test kits to check for hidden blood in the stool.  A small camera at the end of a tube can be used to examine your colon directly (sigmoidoscopy or colonoscopy). This is done to check for the earliest forms of colorectal cancer.  Routine screening usually begins at  age 5.  Direct examination of the colon should be repeated every 5-10 years through 78 years of age. However, you may need to be screened more often if early forms of precancerous polyps or small growths are found. Skin Cancer  Check your skin from head to toe regularly.  Tell your health care provider about any new moles or changes in moles, especially if there is a change in a mole's shape or color.  Also tell your health care provider if you have a mole that is larger than the size of a pencil eraser.  Always use sunscreen. Apply sunscreen liberally and repeatedly throughout the day.  Protect yourself by wearing long sleeves, pants, a wide-brimmed hat, and sunglasses whenever you are outside. HEART DISEASE, DIABETES, AND HIGH BLOOD PRESSURE   Have your blood pressure checked at least every 1-2 years. High blood pressure causes heart disease and increases the risk of stroke.  If you are between 70 years and 61 years old, ask your health care provider if you should take aspirin to prevent strokes.  Have regular diabetes screenings. This involves taking a blood sample to check your  fasting blood sugar level.  If you are at a normal weight and have a low risk for diabetes, have this test once every three years after 78 years of age.  If you are overweight and have a high risk for diabetes, consider being tested at a younger age or more often. PREVENTING INFECTION  Hepatitis B  If you have a higher risk for hepatitis B, you should be screened for this virus. You are considered at high risk for hepatitis B if:  You were born in a country where hepatitis B is common. Ask your health care provider which countries are considered high risk.  Your parents were born in a high-risk country, and you have not been immunized against hepatitis B (hepatitis B vaccine).  You have HIV or AIDS.  You use needles to inject street drugs.  You live with someone who has hepatitis B.  You have had sex with someone who has hepatitis B.  You get hemodialysis treatment.  You take certain medicines for conditions, including cancer, organ transplantation, and autoimmune conditions. Hepatitis C  Blood testing is recommended for:  Everyone born from [birth year] through 1963.  Anyone with known risk factors for hepatitis C. Sexually transmitted infections (STIs)  You should be screened for sexually transmitted infections (STIs) including gonorrhea and chlamydia if:  You are sexually active and are younger than 78 years of age.  You are older than 78 years of age and your health care provider tells you that you are at risk for this type of infection.  Your sexual activity has changed since you were last screened and you are at an increased risk for chlamydia or gonorrhea. Ask your health care provider if you are at risk.  If you do not have HIV, but are at risk, it may be recommended that you take a prescription medicine daily to prevent HIV infection. This is called pre-exposure prophylaxis (PrEP). You are considered at risk if:  You are sexually active and do not regularly use condoms or  know the HIV status of your partner(s).  You take drugs by injection.  You are sexually active with a partner who has HIV. Talk with your health care provider about whether you are at high risk of being infected with HIV. If you choose to begin PrEP, you should first be tested for HIV. You  should then be tested every 3 months for as long as you are taking PrEP.  PREGNANCY   If you are premenopausal and you may become pregnant, ask your health care provider about preconception counseling.  If you may become pregnant, take 400 to 800 micrograms (mcg) of folic acid every day.  If you want to prevent pregnancy, talk to your health care provider about birth control (contraception). OSTEOPOROSIS AND MENOPAUSE   Osteoporosis is a disease in which the bones lose minerals and strength with aging. This can result in serious bone fractures. Your risk for osteoporosis can be identified using a bone density scan.  If you are 20 years of age or older, or if you are at risk for osteoporosis and fractures, ask your health care provider if you should be screened.  Ask your health care provider whether you should take a calcium or vitamin D supplement to lower your risk for osteoporosis.  Menopause may have certain physical symptoms and risks.  Hormone replacement therapy may reduce some of these symptoms and risks. Talk to your health care provider about whether hormone replacement therapy is right for you.  HOME CARE INSTRUCTIONS   Schedule regular health, dental, and eye exams.  Stay current with your immunizations.   Do not use any tobacco products including cigarettes, chewing tobacco, or electronic cigarettes.  If you are pregnant, do not drink alcohol.  If you are breastfeeding, limit how much and how often you drink alcohol.  Limit alcohol intake to no more than 1 drink per day for nonpregnant women. One drink equals 12 ounces of beer, 5 ounces of wine, or 1 ounces of hard liquor.  Do  not use street drugs.  Do not share needles.  Ask your health care provider for help if you need support or information about quitting drugs.  Tell your health care provider if you often feel depressed.  Tell your health care provider if you have ever been abused or do not feel safe at home. Document Released: 09/25/2010 Document Revised: 07/27/2013 Document Reviewed: 02/11/2013 [facility] Patient Information 2015 [place], [place]. This information is not intended to replace advice given to you by your health care provider. Make sure you discuss any questions you have with your health care provider. Cardiac Diet This diet can help prevent heart disease and stroke. Many factors influence your heart health, including eating and exercise habits. Coronary risk rises a lot with abnormal blood fat (lipid) levels. Cardiac meal planning includes limiting unhealthy fats, increasing healthy fats, and making other small dietary changes. General guidelines are as follows:  Adjust calorie intake to reach and maintain desirable body weight.  Limit total fat intake to less than 30% of total calories. Saturated fat should be less than 7% of calories.  Saturated fats are found in animal products and in some vegetable products. Saturated vegetable fats are found in coconut oil, cocoa butter, palm oil, and palm kernel oil. Read labels carefully to avoid these products as much as possible. Use butter in moderation. Choose tub margarines and oils that have 2 grams of fat or less. Good cooking oils are canola and olive oils.  Practice low-fat cooking techniques. Do not fry food. Instead, broil, bake, boil, steam, grill, roast on a rack, stir-fry, or microwave it. Other fat reducing suggestions include:  Remove the skin from poultry.  Remove all visible fat from meats.  Skim the fat off stews, soups, and gravies before serving them.  Steam vegetables in water or broth instead  of sauting them in fat.  Avoid  foods with trans fat (or hydrogenated oils), such as commercially fried foods and commercially baked goods. Commercial shortening and deep-frying fats will contain trans fat.  Increase intake of fruits, vegetables, whole grains, and legumes to replace foods high in fat.  Increase consumption of nuts, legumes, and seeds to at least 4 servings weekly. One serving of a legume equals  cup, and 1 serving of nuts or seeds equals  cup.  Choose whole grains more often. Have 3 servings per day (a serving is 1 ounce [oz]).  Eat 4 to 5 servings of vegetables per day. A serving of vegetables is 1 cup of raw leafy vegetables;  cup of raw or cooked cut-up vegetables;  cup of vegetable juice.  Eat 4 to 5 servings of fruit per day. A serving of fruit is 1 medium whole fruit;  cup of dried fruit;  cup of fresh, frozen, or canned fruit;  cup of 100% fruit juice.  Increase your intake of dietary fiber to 20 to 30 grams per day. Insoluble fiber may help lower your risk of heart disease and may help curb your appetite.  Soluble fiber binds cholesterol to be removed from the blood. Foods high in soluble fiber are dried beans, citrus fruits, oats, apples, bananas, broccoli, Brussels sprouts, and eggplant.  Try to include foods fortified with plant sterols or stanols, such as yogurt, breads, juices, or margarines. Choose several fortified foods to achieve a daily intake of 2 to 3 grams of plant sterols or stanols.  Foods with omega-3 fats can help reduce your risk of heart disease. Aim to have a 3.5 oz portion of fatty fish twice per week, such as salmon, mackerel, albacore tuna, sardines, lake trout, or herring. If you wish to take a fish oil supplement, choose one that contains 1 gram of both DHA and EPA.  Limit processed meats to 2 servings (3 oz portion) weekly.  Limit the sodium in your diet to 1500 milligrams (mg) per day. If you have high blood pressure, talk to a registered dietitian about a DASH  (Dietary Approaches to Stop Hypertension) eating plan.  Limit sweets and beverages with added sugar, such as soda, to no more than 5 servings per week. One serving is:   1 tablespoon sugar.  1 tablespoon jelly or jam.   cup sorbet.  1 cup lemonade.   cup regular soda. CHOOSING FOODS Starches  Allowed: Breads: All kinds (wheat, rye, raisin, white, oatmeal, [place], [place], and English muffin bread). Low-fat rolls: English muffins, frankfurter and hamburger buns, bagels, pita bread, tortillas (not fried). Pancakes, waffles, biscuits, and muffins made with recommended oil.  Avoid: Products made with saturated or trans fats, oils, or whole milk products. Butter rolls, cheese breads, croissants. Commercial doughnuts, muffins, sweet rolls, biscuits, waffles, pancakes, store-bought mixes. Crackers  Allowed: Low-fat crackers and snacks: Animal, graham, rye, saltine (with recommended oil, no lard), oyster, and matzo crackers. Bread sticks, melba toast, rusks, flatbread, pretzels, and light popcorn.  Avoid: High-fat crackers: cheese crackers, butter crackers, and those made with coconut, palm oil, or trans fat (hydrogenated oils). Buttered popcorn. Cereals  Allowed: Hot or cold whole-grain cereals.  Avoid: Cereals containing coconut, hydrogenated vegetable fat, or animal fat. Potatoes / Pasta / Rice  Allowed: All kinds of potatoes, rice, and pasta (such as macaroni, spaghetti, and noodles).  Avoid: Pasta or rice prepared with cream sauce or high-fat cheese. Chow mein noodles, [place] fries. Vegetables  Allowed: All vegetables and  vegetable juices.  Avoid: Fried vegetables. Vegetables in cream, butter, or high-fat cheese sauces. Limit coconut. Fruit in cream or custard. Protein  Allowed: Limit your intake of meat, seafood, and poultry to no more than 6 oz (cooked weight) per day. All lean, well-trimmed beef, veal, pork, and lamb. All chicken and [place] without skin. All fish and  shellfish. Wild game: wild duck, rabbit, pheasant, and venison. Egg whites or low-cholesterol egg substitutes may be used as desired. Meatless dishes: recipes with dried beans, peas, lentils, and tofu (soybean curd). Seeds and nuts: all seeds and most nuts.  Avoid: Prime grade and other heavily marbled and fatty meats, such as short ribs, spare ribs, rib eye roast or steak, frankfurters, sausage, bacon, and high-fat luncheon meats, mutton. Caviar. Commercially fried fish. Domestic duck, goose, venison sausage. Organ meats: liver, gizzard, heart, chitterlings, brains, kidney, sweetbreads. Dairy  Allowed: Low-fat cheeses: nonfat or low-fat cottage cheese (1% or 2% fat), cheeses made with part skim milk, such as mozzarella, farmers, string, or ricotta. (Cheeses should be labeled no more than 2 to 6 grams fat per oz.). Skim (or 1%) milk: liquid, powdered, or evaporated. Buttermilk made with low-fat milk. Drinks made with skim or low-fat milk or cocoa. Chocolate milk or cocoa made with skim or low-fat (1%) milk. Nonfat or low-fat yogurt.  Avoid: Whole milk cheeses, including colby, cheddar, [place], Monterey Jack, [place], [place], [place], American, Swiss, and blue. Creamed cottage cheese, cream cheese. Whole milk and whole milk products, including buttermilk or yogurt made from whole milk, drinks made from whole milk. Condensed milk, evaporated whole milk, and 2% milk. Soups and Combination Foods  Allowed: Low-fat low-sodium soups: broth, dehydrated soups, homemade broth, soups with the fat removed, homemade cream soups made with skim or low-fat milk. Low-fat spaghetti, lasagna, chili, and Spanish rice if low-fat ingredients and low-fat cooking techniques are used.  Avoid: Cream soups made with whole milk, cream, or high-fat cheese. All other soups. Desserts and Sweets  Allowed: Sherbet, fruit ices, gelatins, meringues, and angel food cake. Homemade desserts with recommended fats, oils, and milk  products. Jam, jelly, honey, marmalade, sugars, and syrups. Pure sugar candy, such as gum drops, hard candy, jelly beans, marshmallows, mints, and small amounts of dark chocolate.  Avoid: Commercially prepared cakes, pies, cookies, frosting, pudding, or mixes for these products. Desserts containing whole milk products, chocolate, coconut, lard, palm oil, or palm kernel oil. Ice cream or ice cream drinks. Candy that contains chocolate, coconut, butter, hydrogenated fat, or unknown ingredients. Buttered syrups. Fats and Oils  Allowed: Vegetable oils: safflower, sunflower, corn, soybean, cottonseed, sesame, canola, olive, or peanut. Non-hydrogenated margarines. Salad dressing or mayonnaise: homemade or commercial, made with a recommended oil. Low or nonfat salad dressing or mayonnaise.  Limit added fats and oils to 6 to 8 tsp per day (includes fats used in cooking, baking, salads, and spreads on bread). Remember to count the "hidden fats" in foods.  Avoid: Solid fats and shortenings: butter, lard, salt pork, bacon drippings. Gravy containing meat fat, shortening, or suet. Cocoa butter, coconut. Coconut oil, palm oil, palm kernel oil, or hydrogenated oils: these ingredients are often used in bakery products, nondairy creamers, whipped toppings, candy, and commercially fried foods. Read labels carefully. Salad dressings made of unknown oils, sour cream, or cheese, such as blue cheese and Roquefort. Cream, all kinds: half-and-half, light, heavy, or whipping. Sour cream or cream cheese (even if "light" or low-fat). Nondairy cream substitutes: coffee creamers and sour cream substitutes made with palm,  palm kernel, hydrogenated oils, or coconut oil. Beverages  Allowed: Coffee (regular or decaffeinated), tea. Diet carbonated beverages, mineral water. Alcohol: Check with your caregiver. Moderation is recommended.  Avoid: Whole milk, regular sodas, and juice drinks with added sugar. Condiments  Allowed: All  seasonings and condiments. Cocoa powder. "Cream" sauces made with recommended ingredients.  Avoid: Carob powder made with hydrogenated fats. SAMPLE MENU Breakfast   cup orange juice   cup oatmeal  1 slice toast  1 tsp margarine  1 cup skim milk Lunch  [place] sandwich with 2 oz [place], 2 slices bread  Lettuce and tomato slices  Fresh fruit  Carrot sticks  Coffee or tea Snack  Fresh fruit or low-fat crackers Dinner  3 oz lean ground beef  1 baked potato  1 tsp margarine   cup asparagus  Lettuce salad  1 tbs non-creamy dressing   cup peach slices  1 cup skim milk Document Released: 12/20/2007 Document Revised: 09/11/2011 Document Reviewed: 05/12/2013 [employer] Patient Information 2015 [place], [place]. This information is not intended to replace advice given to you by your health care provider. Make sure you discuss any questions you have with your health care provider.

## 2013-11-12 NOTE — Progress Notes (Signed)
Pre visit review using our clinic review tool, if applicable. No additional management support is needed unless otherwise documented below in the visit note. 

## 2013-11-13 ENCOUNTER — Telehealth: Payer: Self-pay | Admitting: Internal Medicine

## 2013-11-13 NOTE — Telephone Encounter (Signed)
Relevant patient education mailed to patient.  

## 2013-11-18 ENCOUNTER — Telehealth: Payer: Self-pay | Admitting: Internal Medicine

## 2013-11-18 NOTE — Telephone Encounter (Signed)
Authorization # [id number] start  11/18/2013 - 05/21/2014 Pt was scheduled for today 11-18-2013 [facility]: Johna Sheriff MD  Ophthalmologist  Address: [address], [place], [place] [address]  [phone]

## 2013-11-18 NOTE — Telephone Encounter (Signed)
Pt needs a [facility] referral for annual eye exam. Pt has appt today.

## 2014-05-17 ENCOUNTER — Encounter: Payer: Self-pay | Admitting: Internal Medicine

## 2014-05-17 ENCOUNTER — Ambulatory Visit (INDEPENDENT_AMBULATORY_CARE_PROVIDER_SITE_OTHER): Payer: Commercial Managed Care - HMO | Admitting: Internal Medicine

## 2014-05-17 VITALS — BP 196/94 | HR 80 | Temp 98.0°F | Ht 64.0 in | Wt 169.0 lb

## 2014-05-17 DIAGNOSIS — D239 Other benign neoplasm of skin, unspecified: Secondary | ICD-10-CM | POA: Diagnosis not present

## 2014-05-17 DIAGNOSIS — R010 Benign and innocent cardiac murmurs: Secondary | ICD-10-CM

## 2014-05-17 DIAGNOSIS — R011 Cardiac murmur, unspecified: Secondary | ICD-10-CM

## 2014-05-17 DIAGNOSIS — I10 Essential (primary) hypertension: Secondary | ICD-10-CM

## 2014-05-17 DIAGNOSIS — M810 Age-related osteoporosis without current pathological fracture: Secondary | ICD-10-CM

## 2014-05-17 NOTE — Progress Notes (Signed)
Pre visit review using our clinic review tool, if applicable. No additional management support is needed unless otherwise documented below in the visit note. 

## 2014-05-17 NOTE — Progress Notes (Signed)
Subjective:    Patient ID: Tiffany [name], female    DOB: [birth year], 81 y.o.   MRN: [id number]  HPI 81 year old patient who is seen today for follow-up.  She has a history of hypertension.  Also has a history of a systolic murmur and very mild aortic stenosis.  No recent 2-D echocardiogram.  She has a history of colonic polyps, allergic rhinitis and low back pain. She is concerned about a lesion involving the left midabdominal area  Past Medical History  Diagnosis Date  . ALLERGIC RHINITIS 11/21/2006  . BACK PAIN 12/17/2006  . COLONIC POLYPS, HX OF 11/21/2006  . HYPERTENSION 11/21/2006  . OSTEOPENIA 08/11/2009  . OSTEOPOROSIS 11/21/2006  . PEDAL EDEMA 07/05/2008  . SYSTOLIC MURMUR 5/88/5027    History   Social History  . Marital Status: Widowed    Spouse Name: N/A  . Number of Children: N/A  . Years of Education: N/A   Occupational History  . Not on file.   Social History Main Topics  . Smoking status: Former Smoker    Quit date: 03/27/1995  . Smokeless tobacco: Never Used  . Alcohol Use: No  . Drug Use: No  . Sexual Activity: Not on file   Other Topics Concern  . Not on file   Social History Narrative    Past Surgical History  Procedure Laterality Date  . Hemorrhoid surgery    . Tonsillectomy    . Breast surgery      bx  . Cataract extraction  2012    No family history on file.  Allergies  Allergen Reactions  . Codeine Phosphate     Current Outpatient Prescriptions on File Prior to Visit  Medication Sig Dispense Refill  . aspirin 81 MG tablet Take 81 mg by mouth daily.      . benazepril (LOTENSIN) 40 MG tablet Take 1 tablet (40 mg total) by mouth daily. 90 tablet 3  . Calcium Carbonate (CALTRATE 600) 1500 MG TABS Take by mouth 2 (two) times daily.      . Cholecalciferol (VITAMIN D) 1000 UNITS capsule Take 1,000 Units by mouth daily.      . hydrochlorothiazide (HYDRODIURIL) 25 MG tablet Take 1 tablet (25 mg total) by mouth daily. 90 tablet 3  .  Multiple Vitamin (MULTIVITAMIN) tablet Take 1 tablet by mouth daily.      . Omega-3 Fatty Acids (FISH OIL) 1200 MG CAPS Take 1 capsule by mouth daily.     No current facility-administered medications on file prior to visit.    BP 196/94 mmHg  Pulse 80  Temp(Src) 98 F (36.7 C) (Oral)  Ht 5\' 4"  (1.626 m)  Wt 169 lb (76.658 kg)  BMI 28.99 kg/m2      Review of Systems  Constitutional: Negative.   HENT: Negative for congestion, dental problem, hearing loss, rhinorrhea, sinus pressure, sore throat and tinnitus.   Eyes: Negative for pain, discharge and visual disturbance.  Respiratory: Negative for cough and shortness of breath.   Cardiovascular: Negative for chest pain, palpitations and leg swelling.  Gastrointestinal: Negative for nausea, vomiting, abdominal pain, diarrhea, constipation, blood in stool and abdominal distention.  Genitourinary: Negative for dysuria, urgency, frequency, hematuria, flank pain, vaginal bleeding, vaginal discharge, difficulty urinating, vaginal pain and pelvic pain.  Musculoskeletal: Negative for joint swelling, arthralgias and gait problem.  Skin: Positive for rash.  Neurological: Negative for dizziness, syncope, speech difficulty, weakness, numbness and headaches.  Hematological: Negative for adenopathy.  Psychiatric/Behavioral: Negative for behavioral problems, dysphoric  mood and agitation. The patient is not nervous/anxious.        Objective:   Physical Exam  Constitutional: She is oriented to person, place, and time. She appears well-developed and well-nourished.  Repeat blood pressure 140/82  HENT:  Head: Normocephalic.  Right Ear: External ear normal.  Left Ear: External ear normal.  Mouth/Throat: Oropharynx is clear and moist.  Eyes: Conjunctivae and EOM are normal. Pupils are equal, round, and reactive to light.  Neck: Normal range of motion. Neck supple. No thyromegaly present.  Cardiovascular: Normal rate, regular rhythm and intact  distal pulses.   Murmur heard. Grade 3/6 systolic murmur loudest at the primary aortic area with radiation to the carotid distribution  Pulmonary/Chest: Effort normal and breath sounds normal.  Abdominal: Soft. Bowel sounds are normal. She exhibits no mass. There is no tenderness.  Musculoskeletal: Normal range of motion.  Lymphadenopathy:    She has no cervical adenopathy.  Neurological: She is alert and oriented to person, place, and time.  Skin: Skin is warm and dry. No rash noted.  2 cm hyperpigmented lesion involving the left midabdominal area  Psychiatric: She has a normal mood and affect. Her behavior is normal.          Assessment & Plan:   Hypertension.  Will continue present regimen History of colonic polyps Osteopenia.  Continue vitamin D supplementation  Dysplastic lesion, left midabdominal area  Procedure note after local prep with Betadine.  Local anesthesia obtained with 1% Xylocaine.; The lesion removed without difficulty with a shave.  Local hemostasis obtained with pressure.  The wound dressed Local wound care discussed

## 2014-05-17 NOTE — Patient Instructions (Signed)
Limit your sodium (Salt) intake  Return in 6 months for follow-up  Take a calcium supplement, plus [phone] units of vitamin D

## 2014-05-18 ENCOUNTER — Telehealth: Payer: Self-pay | Admitting: Internal Medicine

## 2014-05-18 NOTE — Telephone Encounter (Signed)
Referral in suspended status ( awaiting approval ) Outpatient Authorization [id number]  05/18/2014 - 11/14/2014 [facility] [address] [place], [place] [address] Next to the [facility] Phone: [phone] Fax: [phone]

## 2014-05-24 ENCOUNTER — Ambulatory Visit (HOSPITAL_COMMUNITY): Payer: Medicare HMO | Attending: Internal Medicine | Admitting: Radiology

## 2014-05-24 ENCOUNTER — Encounter: Payer: Self-pay | Admitting: Internal Medicine

## 2014-05-24 DIAGNOSIS — R010 Benign and innocent cardiac murmurs: Secondary | ICD-10-CM | POA: Diagnosis not present

## 2014-05-24 DIAGNOSIS — Z961 Presence of intraocular lens: Secondary | ICD-10-CM | POA: Diagnosis not present

## 2014-05-24 DIAGNOSIS — I10 Essential (primary) hypertension: Secondary | ICD-10-CM

## 2014-05-24 DIAGNOSIS — R011 Cardiac murmur, unspecified: Secondary | ICD-10-CM | POA: Diagnosis not present

## 2014-05-24 DIAGNOSIS — H3531 Nonexudative age-related macular degeneration: Secondary | ICD-10-CM | POA: Diagnosis not present

## 2014-05-24 DIAGNOSIS — I359 Nonrheumatic aortic valve disorder, unspecified: Secondary | ICD-10-CM | POA: Insufficient documentation

## 2014-05-24 NOTE — Progress Notes (Signed)
Echocardiogram performed.  

## 2014-05-27 ENCOUNTER — Other Ambulatory Visit: Payer: Self-pay | Admitting: Internal Medicine

## 2014-06-19 ENCOUNTER — Other Ambulatory Visit: Payer: Self-pay | Admitting: Internal Medicine

## 2014-06-22 ENCOUNTER — Other Ambulatory Visit: Payer: Self-pay | Admitting: Internal Medicine

## 2014-06-22 DIAGNOSIS — Z1231 Encounter for screening mammogram for malignant neoplasm of breast: Secondary | ICD-10-CM

## 2014-07-09 ENCOUNTER — Ambulatory Visit (HOSPITAL_COMMUNITY)
Admission: RE | Admit: 2014-07-09 | Discharge: 2014-07-09 | Disposition: A | Discharge status: Home / Self Care | Payer: Commercial Managed Care - HMO | Source: Ambulatory Visit | Attending: Internal Medicine | Admitting: Internal Medicine

## 2014-07-09 DIAGNOSIS — Z1231 Encounter for screening mammogram for malignant neoplasm of breast: Secondary | ICD-10-CM | POA: Insufficient documentation

## 2014-10-19 ENCOUNTER — Other Ambulatory Visit: Payer: Medicare HMO

## 2014-10-28 ENCOUNTER — Ambulatory Visit (INDEPENDENT_AMBULATORY_CARE_PROVIDER_SITE_OTHER): Payer: Commercial Managed Care - HMO | Admitting: Internal Medicine

## 2014-10-28 ENCOUNTER — Encounter: Payer: Self-pay | Admitting: Internal Medicine

## 2014-10-28 VITALS — BP 160/98 | Temp 98.9°F | Ht 64.0 in | Wt 165.0 lb

## 2014-10-28 DIAGNOSIS — J309 Allergic rhinitis, unspecified: Secondary | ICD-10-CM

## 2014-10-28 DIAGNOSIS — I10 Essential (primary) hypertension: Secondary | ICD-10-CM

## 2014-10-28 DIAGNOSIS — J3089 Other allergic rhinitis: Secondary | ICD-10-CM

## 2014-10-28 DIAGNOSIS — Z0000 Encounter for general adult medical examination without abnormal findings: Secondary | ICD-10-CM

## 2014-10-28 DIAGNOSIS — I359 Nonrheumatic aortic valve disorder, unspecified: Secondary | ICD-10-CM

## 2014-10-28 DIAGNOSIS — Z86010 Personal history of colonic polyps: Secondary | ICD-10-CM | POA: Diagnosis not present

## 2014-10-28 MED ORDER — HYDROCHLOROTHIAZIDE 25 MG PO TABS: 25.0000 mg | ORAL_TABLET | Freq: Every day | ORAL | Status: DC

## 2014-10-28 MED ORDER — BENAZEPRIL HCL 40 MG PO TABS: 40.0000 mg | ORAL_TABLET | Freq: Every day | ORAL | Status: DC

## 2014-10-28 MED ORDER — AMLODIPINE BESYLATE 2.5 MG PO TABS: 2.5000 mg | ORAL_TABLET | Freq: Every day | ORAL | Status: DC

## 2014-10-28 NOTE — Patient Instructions (Signed)
Limit your sodium (Salt) intake    It is important that you exercise regularly, at least 20 minutes 3 to 4 times per week.  If you develop chest pain or shortness of breath seek  medical attention.  Please check your blood pressure on a regular basis.  If it is consistently greater than 150/90, please make an office appointment.  Return in one month for follow-up  Health Maintenance Adopting a healthy lifestyle and getting preventive care can go a long way to promote health and wellness. Talk with your health care provider about what schedule of regular examinations is right for you. This is a good chance for you to check in with your provider about disease prevention and staying healthy. In between checkups, there are plenty of things you can do on your own. Experts have done a lot of research about which lifestyle changes and preventive measures are most likely to keep you healthy. Ask your health care provider for more information. WEIGHT AND DIET  Eat a healthy diet  Be sure to include plenty of vegetables, fruits, low-fat dairy products, and lean protein.  Do not eat a lot of foods high in solid fats, added sugars, or salt.  Get regular exercise. This is one of the most important things you can do for your health.  Most adults should exercise for at least 150 minutes each week. The exercise should increase your heart rate and make you sweat (moderate-intensity exercise).  Most adults should also do strengthening exercises at least twice a week. This is in addition to the moderate-intensity exercise.  Maintain a healthy weight  Body mass index (BMI) is a measurement that can be used to identify possible weight problems. It estimates body fat based on height and weight. Your health care provider can help determine your BMI and help you achieve or maintain a healthy weight.  For females 33 years of age and older:   A BMI below 18.5 is considered underweight.  A BMI of 18.5 to 24.9  is normal.  A BMI of 25 to 29.9 is considered overweight.  A BMI of 30 and above is considered obese.  Watch levels of cholesterol and blood lipids  You should start having your blood tested for lipids and cholesterol at 79 years of age, then have this test every 5 years.  You may need to have your cholesterol levels checked more often if:  Your lipid or cholesterol levels are high.  You are older than 79 years of age.  You are at high risk for heart disease.  CANCER SCREENING   Lung Cancer  Lung cancer screening is recommended for adults 59-77 years old who are at high risk for lung cancer because of a history of smoking.  A yearly low-dose CT scan of the lungs is recommended for people who:  Currently smoke.  Have quit within the past 15 years.  Have at least a 30-pack-year history of smoking. A pack year is smoking an average of one pack of cigarettes a day for 1 year.  Yearly screening should continue until it has been 15 years since you quit.  Yearly screening should stop if you develop a health problem that would prevent you from having lung cancer treatment.  Breast Cancer  Practice breast self-awareness. This means understanding how your breasts normally appear and feel.  It also means doing regular breast self-exams. Let your health care provider know about any changes, no matter how small.  If you are in your  or 30s, you should have a clinical breast exam (CBE) by a health care provider every 1-3 years as part of a regular health exam.  If you are 40 or older, have a CBE every year. Also consider having a breast X-ray (mammogram) every year.  If you have a family history of breast cancer, talk to your health care provider about genetic screening.  If you are at high risk for breast cancer, talk to your health care provider about having an MRI and a mammogram every year.  Breast cancer gene (BRCA) assessment is recommended for women who have family members  with BRCA-related cancers. BRCA-related cancers include:  Breast.  Ovarian.  Tubal.  Peritoneal cancers.  Results of the assessment will determine the need for genetic counseling and BRCA1 and BRCA2 testing. Cervical Cancer Routine pelvic examinations to screen for cervical cancer are no longer recommended for nonpregnant women who are considered low risk for cancer of the pelvic organs (ovaries, uterus, and vagina) and who do not have symptoms. A pelvic examination may be necessary if you have symptoms including those associated with pelvic infections. Ask your health care provider if a screening pelvic exam is right for you.   The Pap test is the screening test for cervical cancer for women who are considered at risk.  If you had a hysterectomy for a problem that was not cancer or a condition that could lead to cancer, then you no longer need Pap tests.  If you are older than 65 years, and you have had normal Pap tests for the past 10 years, you no longer need to have Pap tests.  If you have had past treatment for cervical cancer or a condition that could lead to cancer, you need Pap tests and screening for cancer for at least 20 years after your treatment.  If you no longer get a Pap test, assess your risk factors if they change (such as having a new sexual partner). This can affect whether you should start being screened again.  Some women have medical problems that increase their chance of getting cervical cancer. If this is the case for you, your health care provider may recommend more frequent screening and Pap tests.  The human papillomavirus (HPV) test is another test that may be used for cervical cancer screening. The HPV test looks for the virus that can cause cell changes in the cervix. The cells collected during the Pap test can be tested for HPV.  The HPV test can be used to screen women 30 years of age and older. Getting tested for HPV can extend the interval between normal  Pap tests from three to five years.  An HPV test also should be used to screen women of any age who have unclear Pap test results.  After 79 years of age, women should have HPV testing as often as Pap tests.  Colorectal Cancer  This type of cancer can be detected and often prevented.  Routine colorectal cancer screening usually begins at 79 years of age and continues through 79 years of age.  Your health care provider may recommend screening at an earlier age if you have risk factors for colon cancer.  Your health care provider may also recommend using home test kits to check for hidden blood in the stool.  A small camera at the end of a tube can be used to examine your colon directly (sigmoidoscopy or colonoscopy). This is done to check for the earliest forms of colorectal cancer.    Routine screening usually begins at age 50.  Direct examination of the colon should be repeated every 5-10 years through 79 years of age. However, you may need to be screened more often if early forms of precancerous polyps or small growths are found. Skin Cancer  Check your skin from head to toe regularly.  Tell your health care provider about any new moles or changes in moles, especially if there is a change in a mole's shape or color.  Also tell your health care provider if you have a mole that is larger than the size of a pencil eraser.  Always use sunscreen. Apply sunscreen liberally and repeatedly throughout the day.  Protect yourself by wearing long sleeves, pants, a wide-brimmed hat, and sunglasses whenever you are outside. HEART DISEASE, DIABETES, AND HIGH BLOOD PRESSURE   Have your blood pressure checked at least every 1-2 years. High blood pressure causes heart disease and increases the risk of stroke.  If you are between 55 years and 79 years old, ask your health care provider if you should take aspirin to prevent strokes.  Have regular diabetes screenings. This involves taking a blood  sample to check your fasting blood sugar level.  If you are at a normal weight and have a low risk for diabetes, have this test once every three years after 79 years of age.  If you are overweight and have a high risk for diabetes, consider being tested at a younger age or more often. PREVENTING INFECTION  Hepatitis B  If you have a higher risk for hepatitis B, you should be screened for this virus. You are considered at high risk for hepatitis B if:  You were born in a country where hepatitis B is common. Ask your health care provider which countries are considered high risk.  Your parents were born in a high-risk country, and you have not been immunized against hepatitis B (hepatitis B vaccine).  You have HIV or AIDS.  You use needles to inject street drugs.  You live with someone who has hepatitis B.  You have had sex with someone who has hepatitis B.  You get hemodialysis treatment.  You take certain medicines for conditions, including cancer, organ transplantation, and autoimmune conditions. Hepatitis C  Blood testing is recommended for:  Everyone born from 1945 through 1965.  Anyone with known risk factors for hepatitis C. Sexually transmitted infections (STIs)  You should be screened for sexually transmitted infections (STIs) including gonorrhea and chlamydia if:  You are sexually active and are younger than 79 years of age.  You are older than 79 years of age and your health care provider tells you that you are at risk for this type of infection.  Your sexual activity has changed since you were last screened and you are at an increased risk for chlamydia or gonorrhea. Ask your health care provider if you are at risk.  If you do not have HIV, but are at risk, it may be recommended that you take a prescription medicine daily to prevent HIV infection. This is called pre-exposure prophylaxis (PrEP). You are considered at risk if:  You are sexually active and do not  regularly use condoms or know the HIV status of your partner(s).  You take drugs by injection.  You are sexually active with a partner who has HIV. Talk with your health care provider about whether you are at high risk of being infected with HIV. If you choose to begin PrEP, you should first   be tested for HIV. You should then be tested every 3 months for as long as you are taking PrEP.  PREGNANCY   If you are premenopausal and you may become pregnant, ask your health care provider about preconception counseling.  If you may become pregnant, take 400 to 800 micrograms (mcg) of folic acid every day.  If you want to prevent pregnancy, talk to your health care provider about birth control (contraception). OSTEOPOROSIS AND MENOPAUSE   Osteoporosis is a disease in which the bones lose minerals and strength with aging. This can result in serious bone fractures. Your risk for osteoporosis can be identified using a bone density scan.  If you are 65 years of age or older, or if you are at risk for osteoporosis and fractures, ask your health care provider if you should be screened.  Ask your health care provider whether you should take a calcium or vitamin D supplement to lower your risk for osteoporosis.  Menopause may have certain physical symptoms and risks.  Hormone replacement therapy may reduce some of these symptoms and risks. Talk to your health care provider about whether hormone replacement therapy is right for you.  HOME CARE INSTRUCTIONS   Schedule regular health, dental, and eye exams.  Stay current with your immunizations.   Do not use any tobacco products including cigarettes, chewing tobacco, or electronic cigarettes.  If you are pregnant, do not drink alcohol.  If you are breastfeeding, limit how much and how often you drink alcohol.  Limit alcohol intake to no more than 1 drink per day for nonpregnant women. One drink equals 12 ounces of beer, 5 ounces of wine, or 1  ounces of hard liquor.  Do not use street drugs.  Do not share needles.  Ask your health care provider for help if you need support or information about quitting drugs.  Tell your health care provider if you often feel depressed.  Tell your health care provider if you have ever been abused or do not feel safe at home. Document Released: 09/25/2010 Document Revised: 07/27/2013 Document Reviewed: 02/11/2013 [employer] Patient Information 2015 [employer]. This information is not intended to replace advice given to you by your health care provider. Make sure you discuss any questions you have with your health care provider.  

## 2014-10-28 NOTE — Progress Notes (Signed)
Pre visit review using our clinic review tool, if applicable. No additional management support is needed unless otherwise documented below in the visit note. 

## 2014-10-28 NOTE — Progress Notes (Signed)
Patient ID: Tiffany [name], female   DOB: [birth year], 81 y.o.   MRN: [id number] Patient ID: Tiffany [name], female   DOB: 04-22-1932, 81 y.o.   MRN: [id number]  Subjective:    Patient ID: Tiffany [name], female    DOB: [birth year], 81 y.o.   MRN: [id number]  HPI    81 year-old patient who is seen today for a annual health maintenance examination.  She has a long history of treated hypertension and this has been stable. She remains on Lotensin and hydrochlorothiazide.  She has had serial bone density studies that have revealed stable osteopenia. She remains on calcium and vitamin D supplements. She has chronic but mild low back pain.   She has a history of a systolic murmur that is related to a mildly calcified aortic valve; follow-up 2-D echocardiogram 2016 reveals moderate aortic stenosis.   She has a history of colonic polyps and did have a colonoscopy last done in 2007. She has been notified by GI that further screening colonoscopies are not appropriate due to her age. She has had a recent lifestyle screening evaluation; this has included normal ABIs  She is accompanied by her son today who is concerned about memory impairment.  She states she has become more forgetful.  She states that she walks 2 miles 3 times per week and participates in the [employer] program  MMSE today 30/30 2015  Current Allergies (reviewed today):  ! CODEINE PHOSPHATE (CODEINE PHOSPHATE)   Past Medical History:   Colonic polyps, hx of  Hypertension  Osteoporosis  Allergic rhinitis  Aortic sclerosis, hx   Past Surgical History:   Hemorrhoidectomy  Tonsillectomy age 81  Breast biopsy  colonoscopy April 2007   Family History:  father died at 75. Coronary artery disease.  Mother died age 47, ovarian cancer  one of 65. Siblings positive for senile dementia breast cancer, COPD; also positive for brain cancer   Social History:  Married  Never Smoked     1. Risk factors, based on past  M,S,F  history- cardiovascular risk factors include hypertension  2.  Physical activities: Now participates  in the [employer] program 3 times weekly 3.  Depression/mood: No history of depression or mood disorder 4.  Hearing: Very minor deficits 5.  ADL's: Independent in all aspects of daily living  6.  Fall risk: Low  7.  Home safety: No problems identified  8.  Height weight, and visual acuity; height and weight stable recent left cataract extraction surgery  9.  Counseling: More regular exercise heart healthy diet salt restricted diet all encouraged  10. Lab orders based on risk factors: Laboratory profile including lipid panel and TSH will be reviewed  11. Referral : Follow GYN  And ophthalmology  12. Care plan: Continue calcium and vitamin D supplements; more active lifestyle encouraged  13. Cognitive assessment: Alert and oriented with normal affect. No cognitive dysfunction.  MMSE 30/30  (2015)  14.  Continue periodic bone density studies in view of her osteopenia..  Consider  Bi annual mammograms;  will have annual clinical exams with screening lab.  Annual eye examinations recommended  15.  Provider list update:  Ophthalmology and radiology and primary care medicine.  Past Medical History  Diagnosis Date  . ALLERGIC RHINITIS 11/21/2006  . BACK PAIN 12/17/2006  . COLONIC POLYPS, HX OF 11/21/2006  . HYPERTENSION 11/21/2006  . OSTEOPENIA 08/11/2009  . OSTEOPOROSIS 11/21/2006  . PEDAL EDEMA 07/05/2008  . SYSTOLIC MURMUR 12/16/3005  History   Social History  . Marital Status: Widowed    Spouse Name: N/A  . Number of Children: N/A  . Years of Education: N/A   Occupational History  . Not on file.   Social History Main Topics  . Smoking status: Former Smoker    Quit date: 03/27/1995  . Smokeless tobacco: Never Used  . Alcohol Use: No  . Drug Use: No  . Sexual Activity: Not on file   Other Topics Concern  . Not on file   Social History Narrative    Past  Surgical History  Procedure Laterality Date  . Hemorrhoid surgery    . Tonsillectomy    . Breast surgery      bx  . Cataract extraction  2012    No family history on file.  Allergies  Allergen Reactions  . Codeine Phosphate     Current Outpatient Prescriptions on File Prior to Visit  Medication Sig Dispense Refill  . aspirin 81 MG tablet Take 81 mg by mouth daily.      . benazepril (LOTENSIN) 40 MG tablet TAKE 1 TABLET EVERY DAY 90 tablet 1  . Calcium Carbonate (CALTRATE 600) 1500 MG TABS Take by mouth 2 (two) times daily.      . Cholecalciferol (VITAMIN D) 1000 UNITS capsule Take 1,000 Units by mouth daily.      . hydrochlorothiazide (HYDRODIURIL) 25 MG tablet TAKE 1 TABLET EVERY DAY 90 tablet 1  . Multiple Vitamin (MULTIVITAMIN) tablet Take 1 tablet by mouth daily.      . Omega-3 Fatty Acids (FISH OIL) 1200 MG CAPS Take 1 capsule by mouth daily.     No current facility-administered medications on file prior to visit.    BP 160/98 mmHg  Temp(Src) 98.9 F (37.2 C) (Oral)  Ht 5\' 4"  (1.626 m)  Wt 165 lb (74.844 kg)  BMI 28.31 kg/m2      Review of Systems  Constitutional: Negative for fever, appetite change, fatigue and unexpected weight change.  HENT: Negative for congestion, dental problem, ear pain, hearing loss, mouth sores, nosebleeds, sinus pressure, sore throat, tinnitus, trouble swallowing and voice change.   Eyes: Negative for photophobia, pain, redness and visual disturbance.  Respiratory: Negative for cough, chest tightness and shortness of breath.   Cardiovascular: Negative for chest pain, palpitations and leg swelling.  Gastrointestinal: Negative for nausea, vomiting, abdominal pain, diarrhea, constipation, blood in stool, abdominal distention and rectal pain.  Genitourinary: Negative for dysuria, urgency, frequency, hematuria, flank pain, vaginal bleeding, vaginal discharge, difficulty urinating, genital sores, vaginal pain, menstrual problem and pelvic pain.   Musculoskeletal: Positive for back pain. Negative for arthralgias and neck stiffness.  Skin: Negative for rash.  Neurological: Negative for dizziness, syncope, speech difficulty, weakness, light-headedness, numbness and headaches.  Hematological: Negative for adenopathy. Does not bruise/bleed easily.  Psychiatric/Behavioral: Negative for suicidal ideas, behavioral problems, self-injury, dysphoric mood and agitation. The patient is not nervous/anxious.        Objective:   Physical Exam  Constitutional: She is oriented to person, place, and time. She appears well-developed and well-nourished.  Blood pressure 170/80  HENT:  Head: Normocephalic and atraumatic.  Right Ear: External ear normal.  Left Ear: External ear normal.  Mouth/Throat: Oropharynx is clear and moist.  Eyes: Conjunctivae and EOM are normal.  Neck: Normal range of motion. Neck supple. No JVD present. No thyromegaly present.  Cardiovascular: Normal rate and regular rhythm.   Murmur heard. The right dorsalis pedis pulse full other pedal pulses were  not easily palpable  Grade 2-7/5 systolic murmur with radiation to the carotid and supraclavicular distribution  Pulmonary/Chest: Effort normal and breath sounds normal. She has no wheezes. She has no rales.  A few crackles right base  Abdominal: Soft. Bowel sounds are normal. She exhibits no distension and no mass. There is no tenderness. There is no rebound and no guarding.  Musculoskeletal: Normal range of motion. She exhibits no edema or tenderness.  Neurological: She is alert and oriented to person, place, and time. She has normal reflexes. No cranial nerve deficit. She exhibits normal muscle tone. Coordination normal.  Skin: Skin is warm and dry. No rash noted.  Psychiatric: She has a normal mood and affect. Her behavior is normal.          Assessment & Plan:   Annual health maintenance examination Osteopenia. Stable we'll continue calcium and vitamin D  supplements Hypertension .  Suboptimal control.  Will add amlodipine 2.5 milligrams daily.  Will recheck in one month.  We'll continue exercise restricted salt diet and home blood pressure monitoring. History colonic polyps.  Will defer further colonoscopy unless clinical indication Moderate aortic stenosis.  We'll check 2-D echo in one year   Laboratory update will be reviewed next month Heart healthy diet, regular exercise, all encouraged.  Low-salt diet recommended

## 2014-12-03 ENCOUNTER — Encounter: Payer: Self-pay | Admitting: Internal Medicine

## 2014-12-03 ENCOUNTER — Ambulatory Visit (INDEPENDENT_AMBULATORY_CARE_PROVIDER_SITE_OTHER): Payer: Medicare HMO | Admitting: Internal Medicine

## 2014-12-03 VITALS — BP 144/82

## 2014-12-03 DIAGNOSIS — J3089 Other allergic rhinitis: Secondary | ICD-10-CM | POA: Diagnosis not present

## 2014-12-03 DIAGNOSIS — E785 Hyperlipidemia, unspecified: Secondary | ICD-10-CM | POA: Diagnosis not present

## 2014-12-03 DIAGNOSIS — I10 Essential (primary) hypertension: Secondary | ICD-10-CM

## 2014-12-03 LAB — CBC WITH DIFFERENTIAL/PLATELET
Basophils Absolute: 0 10*3/uL (ref 0.0–0.1)
Basophils Relative: 0.6 % (ref 0.0–3.0)
Eosinophils Absolute: 0.1 10*3/uL (ref 0.0–0.7)
Eosinophils Relative: 1 % (ref 0.0–5.0)
HEMATOCRIT: 39.9 % (ref 36.0–46.0)
HEMOGLOBIN: 13.6 g/dL (ref 12.0–15.0)
Lymphocytes Relative: 24.6 % (ref 12.0–46.0)
Lymphs Abs: 1.7 10*3/uL (ref 0.7–4.0)
MCHC: 34.1 g/dL (ref 30.0–36.0)
MCV: 87.7 fl (ref 78.0–100.0)
Monocytes Absolute: 0.3 10*3/uL (ref 0.1–1.0)
Monocytes Relative: 4.9 % (ref 3.0–12.0)
Neutro Abs: 4.8 10*3/uL (ref 1.4–7.7)
Neutrophils Relative %: 68.9 % (ref 43.0–77.0)
Platelets: 189 10*3/uL (ref 150.0–400.0)
RBC: 4.55 Mil/uL (ref 3.87–5.11)
RDW: 13.1 % (ref 11.5–15.5)
WBC: 7 10*3/uL (ref 4.0–10.5)

## 2014-12-03 LAB — COMPREHENSIVE METABOLIC PANEL
ALK PHOS: 64 U/L (ref 39–117)
ALT: 14 U/L (ref 0–35)
AST: 17 U/L (ref 0–37)
Albumin: 4.1 g/dL (ref 3.5–5.2)
BUN: 23 mg/dL (ref 6–23)
CHLORIDE: 102 meq/L (ref 96–112)
CO2: 32 mEq/L (ref 19–32)
Calcium: 9.9 mg/dL (ref 8.4–10.5)
Creatinine, Ser: 1.08 mg/dL (ref 0.40–1.20)
GFR: 51.84 mL/min — AB (ref >60.00)
Glucose, Bld: 99 mg/dL (ref 70–99)
Potassium: 3.9 mEq/L (ref 3.5–5.1)
Sodium: 141 mEq/L (ref 135–145)
TOTAL PROTEIN: 7.3 g/dL (ref 6.0–8.3)
Total Bilirubin: 0.5 mg/dL (ref 0.2–1.2)

## 2014-12-03 LAB — LIPID PANEL
Cholesterol: 190 mg/dL (ref 0–200)
HDL: 56.6 mg/dL (ref >39.00)
LDL CALC: 108 mg/dL — AB (ref 0–99)
NONHDL: 133.02
Total CHOL/HDL Ratio: 3
Triglycerides: 124 mg/dL (ref 0.0–149.0)
VLDL: 24.8 mg/dL (ref 0.0–40.0)

## 2014-12-03 LAB — TSH: TSH: 2.04 u[IU]/mL (ref 0.35–4.50)

## 2014-12-03 MED ORDER — FLUTICASONE PROPIONATE 50 MCG/ACT NA SUSP: 2.0000 | Freq: Every day | NASAL | Status: DC

## 2014-12-03 NOTE — Progress Notes (Signed)
Pre visit review using our clinic review tool, if applicable. No additional management support is needed unless otherwise documented below in the visit note. 

## 2014-12-03 NOTE — Patient Instructions (Signed)
Limit your sodium (Salt) intake  Please check your blood pressure on a regular basis.  If it is consistently greater than 150/90, please make an office appointment.  Return in 6 months for follow-up    Use saline irrigation, warm  moist compresses and over-the-counter antihistamine such as Zyrtec, Allegra or Claritin only as directed.  Call if there is no improvement in 5 to 7 days, or sooner if you develop increasing pain, fever, or any new symptoms. Use fluticasone nasal spray daily

## 2014-12-03 NOTE — Progress Notes (Signed)
Subjective:    Patient ID: Tiffany [name], female    DOB: [birth year], 81 y.o.   MRN: [id number]  HPI  81 year old patient who is seen today for follow-up of hypertension.  She was seen 1 month ago for her annual exam and blood pressures was noted to be elevated.  Home blood pressure monitoring reveals blood pressure is 892/11, 80 diastolic range.  She was placed on amlodipine 2.5 milligrams, which she has tolerated well.  She continues to have some allergy symptoms.  Past Medical History  Diagnosis Date  . ALLERGIC RHINITIS 11/21/2006  . BACK PAIN 12/17/2006  . COLONIC POLYPS, HX OF 11/21/2006  . HYPERTENSION 11/21/2006  . OSTEOPENIA 08/11/2009  . OSTEOPOROSIS 11/21/2006  . PEDAL EDEMA 07/05/2008  . SYSTOLIC MURMUR 9/41/7408    Social History   Social History  . Marital Status: Widowed    Spouse Name: N/A  . Number of Children: N/A  . Years of Education: N/A   Occupational History  . Not on file.   Social History Main Topics  . Smoking status: Former Smoker    Quit date: 03/27/1995  . Smokeless tobacco: Never Used  . Alcohol Use: No  . Drug Use: No  . Sexual Activity: Not on file   Other Topics Concern  . Not on file   Social History Narrative    Past Surgical History  Procedure Laterality Date  . Hemorrhoid surgery    . Tonsillectomy    . Breast surgery      bx  . Cataract extraction  2012    No family history on file.  Allergies  Allergen Reactions  . Codeine Phosphate     Current Outpatient Prescriptions on File Prior to Visit  Medication Sig Dispense Refill  . amLODipine (NORVASC) 2.5 MG tablet Take 1 tablet (2.5 mg total) by mouth daily. 90 tablet 3  . aspirin 81 MG tablet Take 81 mg by mouth daily.      . benazepril (LOTENSIN) 40 MG tablet Take 1 tablet (40 mg total) by mouth daily. 90 tablet 1  . Calcium Carbonate (CALTRATE 600) 1500 MG TABS Take by mouth 2 (two) times daily.      . Cholecalciferol (VITAMIN D) 1000 UNITS capsule Take 1,000 Units  by mouth daily.      . hydrochlorothiazide (HYDRODIURIL) 25 MG tablet Take 1 tablet (25 mg total) by mouth daily. 90 tablet 1  . Multiple Vitamin (MULTIVITAMIN) tablet Take 1 tablet by mouth daily.      . Omega-3 Fatty Acids (FISH OIL) 1200 MG CAPS Take 1 capsule by mouth daily.     No current facility-administered medications on file prior to visit.    BP 144/82 mmHg     Review of Systems  Constitutional: Negative.   HENT: Positive for congestion, postnasal drip and sinus pressure. Negative for dental problem, hearing loss, rhinorrhea, sore throat and tinnitus.   Eyes: Positive for itching. Negative for pain, discharge and visual disturbance.  Respiratory: Negative for cough and shortness of breath.   Cardiovascular: Negative for chest pain, palpitations and leg swelling.  Gastrointestinal: Negative for nausea, vomiting, abdominal pain, diarrhea, constipation, blood in stool and abdominal distention.  Genitourinary: Negative for dysuria, urgency, frequency, hematuria, flank pain, vaginal bleeding, vaginal discharge, difficulty urinating, vaginal pain and pelvic pain.  Musculoskeletal: Negative for joint swelling, arthralgias and gait problem.  Skin: Negative for rash.  Neurological: Negative for dizziness, syncope, speech difficulty, weakness, numbness and headaches.  Hematological: Negative for adenopathy.  Psychiatric/Behavioral: Negative for behavioral problems, dysphoric mood and agitation. The patient is not nervous/anxious.        Objective:   Physical Exam  Constitutional: She appears well-developed and well-nourished. No distress.  Blood pressure 144/82          Assessment & Plan:   Hypertension.  Reasonable control.  Will continue present regimen.  Recheck 6 months.  Will continue home blood pressure monitoring.  If blood pressure is consistently greater than 150 over 90.  We will recontact the office.  Otherwise, reassess in 6 months Allergic rhinitis.  Will start  on a regimen of fluticasone and nonsedating antihistamine

## 2015-02-25 DIAGNOSIS — H04123 Dry eye syndrome of bilateral lacrimal glands: Secondary | ICD-10-CM | POA: Diagnosis not present

## 2015-03-11 DIAGNOSIS — H04123 Dry eye syndrome of bilateral lacrimal glands: Secondary | ICD-10-CM | POA: Diagnosis not present

## 2015-05-23 ENCOUNTER — Telehealth: Payer: Self-pay | Admitting: Internal Medicine

## 2015-05-23 DIAGNOSIS — R011 Cardiac murmur, unspecified: Secondary | ICD-10-CM | POA: Diagnosis not present

## 2015-05-23 DIAGNOSIS — I639 Cerebral infarction, unspecified: Secondary | ICD-10-CM | POA: Diagnosis not present

## 2015-05-23 DIAGNOSIS — Z79899 Other long term (current) drug therapy: Secondary | ICD-10-CM | POA: Diagnosis not present

## 2015-05-23 DIAGNOSIS — R4781 Slurred speech: Secondary | ICD-10-CM | POA: Diagnosis not present

## 2015-05-23 DIAGNOSIS — R51 Headache: Secondary | ICD-10-CM | POA: Diagnosis not present

## 2015-05-23 DIAGNOSIS — R4789 Other speech disturbances: Secondary | ICD-10-CM | POA: Diagnosis not present

## 2015-05-23 DIAGNOSIS — R079 Chest pain, unspecified: Secondary | ICD-10-CM | POA: Diagnosis not present

## 2015-05-23 DIAGNOSIS — Z885 Allergy status to narcotic agent status: Secondary | ICD-10-CM | POA: Diagnosis not present

## 2015-05-23 DIAGNOSIS — I6782 Cerebral ischemia: Secondary | ICD-10-CM | POA: Diagnosis not present

## 2015-05-23 DIAGNOSIS — I10 Essential (primary) hypertension: Secondary | ICD-10-CM | POA: Diagnosis not present

## 2015-05-23 DIAGNOSIS — R479 Unspecified speech disturbances: Secondary | ICD-10-CM | POA: Diagnosis not present

## 2015-05-23 DIAGNOSIS — R490 Dysphonia: Secondary | ICD-10-CM | POA: Diagnosis not present

## 2015-05-23 NOTE — Telephone Encounter (Signed)
Patient Name: MERIN DELREAL DOB: 03-22-1933 Initial Comment Caller states c/o slurred speech, thinks she is having a stroke Nurse Assessment Nurse: Ronnald Ramp, RN, Miranda Date/Time (Eastern Time): 05/23/2015 8:27:19 AM Confirm and document reason for call. If symptomatic, describe symptoms. You must click the next button to save text entered. ---Caller states she has been having trouble with her speech for a couple of weeks. She also states she is having trouble walking but cannot express the issue because of her speech. Has the patient traveled out of the country within the last 30 days? ---Not Applicable Does the patient have any new or worsening symptoms? ---Yes Will a triage be completed? ---Yes Related visit to physician within the last 2 weeks? ---No Does the PT have any chronic conditions? (i.e. diabetes, asthma, etc.) ---Yes List chronic conditions. ---HTN Is this a behavioral health or substance abuse call? ---No Guidelines Guideline Title Affirmed Question Affirmed Notes Neurologic Deficit [1] Loss of speech or garbled speech AND [2] gradual onset (e.g., days to weeks) AND [3] present now Final Disposition User See Physician within 4 Hours (or PCP triage) Ronnald Ramp, RN, Miranda Comments Caller states she would be unable to make an appt today. Strongly encouraged patient to go to the ED now. She agrees and states she will go to [facility] ED. Referrals GO TO FACILITY OTHER - SPECIFY Disagree/Comply: Disagree Disagree/Comply Reason: Disagree with instructions

## 2015-05-23 NOTE — Telephone Encounter (Signed)
FYI

## 2015-05-23 NOTE — Telephone Encounter (Signed)
Left message on voicemail calling to make sure went to ED.

## 2015-05-24 NOTE — Telephone Encounter (Signed)
Pt called back, said she went to the ED, Tiffany [name] was given a Rx for Ceftin wanted to know if she should get it filled? Told pt yes she should get it filled and take it and bring papers along to follow up visit with Dr.K. Pt verbalized understanding.

## 2015-05-31 DIAGNOSIS — H353110 Nonexudative age-related macular degeneration, right eye, stage unspecified: Secondary | ICD-10-CM | POA: Diagnosis not present

## 2015-05-31 DIAGNOSIS — H353130 Nonexudative age-related macular degeneration, bilateral, stage unspecified: Secondary | ICD-10-CM | POA: Diagnosis not present

## 2015-05-31 DIAGNOSIS — H353120 Nonexudative age-related macular degeneration, left eye, stage unspecified: Secondary | ICD-10-CM | POA: Diagnosis not present

## 2015-05-31 DIAGNOSIS — Z961 Presence of intraocular lens: Secondary | ICD-10-CM | POA: Diagnosis not present

## 2015-05-31 DIAGNOSIS — H04123 Dry eye syndrome of bilateral lacrimal glands: Secondary | ICD-10-CM | POA: Diagnosis not present

## 2015-06-02 ENCOUNTER — Ambulatory Visit: Payer: Medicare HMO | Admitting: Internal Medicine

## 2015-06-03 ENCOUNTER — Other Ambulatory Visit: Payer: Self-pay | Admitting: Internal Medicine

## 2015-06-03 ENCOUNTER — Ambulatory Visit (INDEPENDENT_AMBULATORY_CARE_PROVIDER_SITE_OTHER): Payer: Commercial Managed Care - HMO | Admitting: Internal Medicine

## 2015-06-03 ENCOUNTER — Encounter: Payer: Self-pay | Admitting: Internal Medicine

## 2015-06-03 VITALS — BP 126/80 | HR 84 | Temp 98.7°F | Resp 20 | Ht 64.0 in | Wt 161.0 lb

## 2015-06-03 DIAGNOSIS — R269 Unspecified abnormalities of gait and mobility: Secondary | ICD-10-CM

## 2015-06-03 DIAGNOSIS — I359 Nonrheumatic aortic valve disorder, unspecified: Secondary | ICD-10-CM

## 2015-06-03 DIAGNOSIS — I10 Essential (primary) hypertension: Secondary | ICD-10-CM | POA: Diagnosis not present

## 2015-06-03 DIAGNOSIS — M810 Age-related osteoporosis without current pathological fracture: Secondary | ICD-10-CM | POA: Diagnosis not present

## 2015-06-03 DIAGNOSIS — R479 Unspecified speech disturbances: Secondary | ICD-10-CM

## 2015-06-03 DIAGNOSIS — Z0100 Encounter for examination of eyes and vision without abnormal findings: Secondary | ICD-10-CM

## 2015-06-03 MED ORDER — ATORVASTATIN CALCIUM 10 MG PO TABS: 10.0000 mg | ORAL_TABLET | Freq: Every day | ORAL | Status: DC

## 2015-06-03 NOTE — Patient Instructions (Addendum)
Limit your sodium (Salt) intake  Please check your blood pressure on a regular basis.  If it is consistently greater than 150/90, please make an office appointment.  Return in 3 months for follow-up  Follow-up ENT consultation as scheduled

## 2015-06-03 NOTE — Progress Notes (Signed)
Subjective:    Patient ID: Tiffany [name], female    DOB: 04-Oct-1932, 82 y.o.   MRN: [id number]  HPI  82 year old patient who is seen today for her six-month follow-up.  She was evaluated in the ED, February 27 due to, speech difficulties.  She states this has been present for about 2 months.  She also complains of some unsteadiness of gait present for about 2 weeks.  There have been no falls. Evaluation included a brain MRA.  The revealed normal distal ICAs and normal intracranial circulation.  Brain MRI revealed age-related chronic ischemic microvascular changes She has a history of moderate aortic stenosis and is due for a annual follow-up  Post ED visit.  The patient was given a ENT referral  Past Medical History  Diagnosis Date  . ALLERGIC RHINITIS 11/21/2006  . BACK PAIN 12/17/2006  . COLONIC POLYPS, HX OF 11/21/2006  . HYPERTENSION 11/21/2006  . OSTEOPENIA 08/11/2009  . OSTEOPOROSIS 11/21/2006  . PEDAL EDEMA 07/05/2008  . SYSTOLIC MURMUR [id number]    Social History   Social History  . Marital Status: Widowed    Spouse Name: N/A  . Number of Children: N/A  . Years of Education: N/A   Occupational History  . Not on file.   Social History Main Topics  . Smoking status: Former Smoker    Quit date: 03/27/1995  . Smokeless tobacco: Never Used  . Alcohol Use: No  . Drug Use: No  . Sexual Activity: Not on file   Other Topics Concern  . Not on file   Social History Narrative    Past Surgical History  Procedure Laterality Date  . Hemorrhoid surgery    . Tonsillectomy    . Breast surgery      bx  . Cataract extraction  2012    No family history on file.  Allergies  Allergen Reactions  . Codeine Phosphate     Current Outpatient Prescriptions on File Prior to Visit  Medication Sig Dispense Refill  . amLODipine (NORVASC) 2.5 MG tablet Take 1 tablet (2.5 mg total) by mouth daily. 90 tablet 3  . aspirin 81 MG tablet Take 81 mg by mouth daily.      . benazepril  (LOTENSIN) 40 MG tablet Take 1 tablet (40 mg total) by mouth daily. 90 tablet 1  . Calcium Carbonate (CALTRATE 600) 1500 MG TABS Take by mouth 2 (two) times daily.      . Cholecalciferol (VITAMIN D) 1000 UNITS capsule Take 1,000 Units by mouth daily.      . hydrochlorothiazide (HYDRODIURIL) 25 MG tablet Take 1 tablet (25 mg total) by mouth daily. 90 tablet 1  . Multiple Vitamin (MULTIVITAMIN) tablet Take 1 tablet by mouth daily.      . Omega-3 Fatty Acids (FISH OIL) 1200 MG CAPS Take 1 capsule by mouth daily.     No current facility-administered medications on file prior to visit.    BP 126/80 mmHg  Pulse 84  Temp(Src) 98.7 F (37.1 C) (Oral)  Resp 20  Ht 5\' 4"  (1.626 m)  Wt 161 lb (73.029 kg)  BMI 27.62 kg/m2  SpO2 98%     Review of Systems  Constitutional: Negative.   HENT: Negative for congestion, dental problem, hearing loss, rhinorrhea, sinus pressure, sore throat and tinnitus.   Eyes: Negative for pain, discharge and visual disturbance.  Respiratory: Negative for cough and shortness of breath.   Cardiovascular: Negative for chest pain, palpitations and leg swelling.  Gastrointestinal: Negative for  nausea, vomiting, abdominal pain, diarrhea, constipation, blood in stool and abdominal distention.  Genitourinary: Negative for dysuria, urgency, frequency, hematuria, flank pain, vaginal bleeding, vaginal discharge, difficulty urinating, vaginal pain and pelvic pain.  Musculoskeletal: Positive for gait problem. Negative for joint swelling and arthralgias.  Skin: Negative for rash.  Neurological: Positive for speech difficulty. Negative for dizziness, syncope, weakness, numbness and headaches.  Hematological: Negative for adenopathy.  Psychiatric/Behavioral: Negative for behavioral problems, dysphoric mood and agitation. The patient is not nervous/anxious.        Objective:   Physical Exam  Constitutional: She is oriented to person, place, and time. She appears well-developed  and well-nourished.  blood pressure 126/80 Repeat 140 over 80  HENT:  Head: Normocephalic.  Right Ear: External ear normal.  Left Ear: External ear normal.  Mouth/Throat: Oropharynx is clear and moist.  Eyes: Conjunctivae and EOM are normal. Pupils are equal, round, and reactive to light.  Neck: Normal range of motion. Neck supple. No thyromegaly present.  Transmitted murmur to carotid and supraclavicular distributions  Cardiovascular: Normal rate, regular rhythm and intact distal pulses.   Murmur heard. Grade 3-4 6 heart systolic murmur loudest at the primary aortic area  Pulmonary/Chest: Effort normal and breath sounds normal.  Abdominal: Soft. Bowel sounds are normal. She exhibits no mass. There is no tenderness.  Musculoskeletal: Normal range of motion.  Speech is slow and deliberate Gait is slow, deliberate but not ataxic Normal finger to nose and heel to shin testing  Lymphadenopathy:    She has no cervical adenopathy.  Neurological: She is alert and oriented to person, place, and time.  Skin: Skin is warm and dry. No rash noted.  Psychiatric: She has a normal mood and affect. Her behavior is normal.          Assessment & Plan:   Speech abnormality/gait abnormality.  Will set up for neurology and speech therapy consultation Moderate aortic stenosis.  Repeat 2-D echocardiogram.  Cardiology evaluation Hypertension, stable  We'll place on low intensity statin therapy  We'll check carotid artery Doppler study (normal   Distal ICAs on MRA)

## 2015-06-03 NOTE — Progress Notes (Signed)
Pre visit review using our clinic review tool, if applicable. No additional management support is needed unless otherwise documented below in the visit note. 

## 2015-06-06 ENCOUNTER — Other Ambulatory Visit: Payer: Self-pay | Admitting: Internal Medicine

## 2015-06-06 ENCOUNTER — Ambulatory Visit (HOSPITAL_COMMUNITY)
Admission: RE | Admit: 2015-06-06 | Discharge: 2015-06-06 | Disposition: A | Discharge status: Home / Self Care | Payer: Commercial Managed Care - HMO | Source: Ambulatory Visit | Attending: Internal Medicine | Admitting: Internal Medicine

## 2015-06-06 DIAGNOSIS — I6523 Occlusion and stenosis of bilateral carotid arteries: Secondary | ICD-10-CM | POA: Diagnosis not present

## 2015-06-06 DIAGNOSIS — R0989 Other specified symptoms and signs involving the circulatory and respiratory systems: Secondary | ICD-10-CM

## 2015-06-06 DIAGNOSIS — R269 Unspecified abnormalities of gait and mobility: Secondary | ICD-10-CM | POA: Diagnosis not present

## 2015-06-06 DIAGNOSIS — R479 Unspecified speech disturbances: Secondary | ICD-10-CM

## 2015-06-06 DIAGNOSIS — I10 Essential (primary) hypertension: Secondary | ICD-10-CM | POA: Insufficient documentation

## 2015-06-07 ENCOUNTER — Ambulatory Visit: Payer: Commercial Managed Care - HMO

## 2015-06-09 ENCOUNTER — Ambulatory Visit: Payer: Commercial Managed Care - HMO | Attending: Internal Medicine

## 2015-06-09 ENCOUNTER — Ambulatory Visit (INDEPENDENT_AMBULATORY_CARE_PROVIDER_SITE_OTHER): Payer: Commercial Managed Care - HMO | Admitting: Cardiology

## 2015-06-09 ENCOUNTER — Encounter: Payer: Self-pay | Admitting: Cardiology

## 2015-06-09 VITALS — BP 146/74 | HR 86 | Ht 64.0 in | Wt 161.8 lb

## 2015-06-09 DIAGNOSIS — I350 Nonrheumatic aortic (valve) stenosis: Secondary | ICD-10-CM | POA: Diagnosis not present

## 2015-06-09 DIAGNOSIS — R4702 Dysphasia: Secondary | ICD-10-CM | POA: Diagnosis not present

## 2015-06-09 DIAGNOSIS — R471 Dysarthria and anarthria: Secondary | ICD-10-CM | POA: Insufficient documentation

## 2015-06-09 DIAGNOSIS — R1310 Dysphagia, unspecified: Secondary | ICD-10-CM | POA: Diagnosis not present

## 2015-06-09 DIAGNOSIS — R4701 Aphasia: Secondary | ICD-10-CM | POA: Insufficient documentation

## 2015-06-09 DIAGNOSIS — E785 Hyperlipidemia, unspecified: Secondary | ICD-10-CM

## 2015-06-09 DIAGNOSIS — I10 Essential (primary) hypertension: Secondary | ICD-10-CM

## 2015-06-09 NOTE — Therapy (Signed)
[facility] [address] [place], [name], [address] Phone: [phone]   Fax:  [phone]  Speech Language Pathology Evaluation  Patient Details  Name: Tiffany [name] MRN: [id number] Date of Birth: August 25, 1932 Referring Provider: Bluford Kaufmann, M.D.  Encounter Date: 06/09/2015      End of Session - 06/09/15 1521    Visit Number 1   Number of Visits 16   Date for SLP Re-Evaluation 08/12/15   Authorization Type humana   SLP Start Time [id number]   SLP Stop Time  1510   SLP Time Calculation (min) 50 min   Activity Tolerance Patient tolerated treatment well      Past Medical History  Diagnosis Date  . ALLERGIC RHINITIS 11/21/2006  . BACK PAIN 12/17/2006  . COLONIC POLYPS, HX OF 11/21/2006  . HYPERTENSION 11/21/2006  . OSTEOPENIA 08/11/2009  . OSTEOPOROSIS 11/21/2006  . PEDAL EDEMA 07/05/2008  . SYSTOLIC MURMUR [id number]    Past Surgical History  Procedure Laterality Date  . Hemorrhoid surgery    . Tonsillectomy    . Breast surgery      bx  . Cataract extraction  2012    There were no vitals filed for this visit.  Visit Diagnosis: Dysarthria  Aphasia  Dysphagia      Subjective Assessment - 06/09/15 1424    Subjective "I can't seem to think of words and when I talk, I'm slower." "People ask me to repeat more now (than last year at this time)."   Currently in Pain? No/denies            SLP Evaluation [facility] - 06/09/15 1424    SLP Visit Information   SLP Received On 06/09/15   Referring Provider Bluford Kaufmann, M.D.   Onset Date early 3017   Medical Diagnosis "Speech Disorder"   Prior Functional Status   Cognitive/Linguistic Baseline Within functional limits   Cognition   Overall Cognitive Status --  DNA due to time constraints   Auditory Comprehension   Overall Auditory Comprehension Appears within functional limits for tasks assessed   Verbal Expression   Overall Verbal Expression  Other (comment)  Pt reports diffculty with anomia, will test ASAP   Other Verbal Expression Comments pt reports it is harder to complete crossword puzzles than last year at this time   Oral Motor/Sensory Function   Overall Oral Motor/Sensory Function Impaired   Labial ROM Reduced right;Reduced left   Labial Symmetry Within Functional Limits   Labial Coordination Reduced   Lingual ROM Reduced right;Reduced left   Lingual Symmetry Within Functional Limits   Lingual Strength Reduced Right  slight reduction on left as well   Lingual Coordination Reduced   Facial ROM Reduced right;Reduced left   Facial Symmetry Within Functional Limits   Velum Impaired left  velar rise not as pronounced on lt side   Motor Speech   Overall Motor Speech Impaired   Respiration Impaired   Level of Impairment Phrase   Phonation Low vocal intensity  sustained /a/ 11 seconds   Resonance Hypernasality  intermittent, worse as sentence length increases   Articulation Impaired   Level of Impairment Phrase   Intelligibility Intelligibility reduced   Word --  100%   Phrase 75-100% accurate  >95%   Sentence 75-100% accurate  >95%   Conversation 75-100% accurate  >90%, decr'd with longer sentences   Motor Speech Errors Aware   Interfering Components --  neurological component suspected   Effective Techniques Increased vocal intensity;Over-articulate  incr'd intelligibility at sentence to 100%     Pt denies overt s/s aspiration PNA, and none noted today.                    SLP Education - Jun 23, 2015 1507    Education provided Yes   Education Details deficit areas, therapy course, recommendation of modified barium swallow, exaggeration/overarticulation to compensate for decr'd speech intelligibliity   Person(s) Educated Patient   Methods Explanation   Comprehension Verbalized understanding          SLP Short Term Goals - 2015-06-23 1525    SLP SHORT TERM GOAL #1   Title pt will demo  compensations for speech intelligibility in 10 minutes simple conversation for intelligiblity >95%   Time 4   Period Weeks   Status New   SLP SHORT TERM GOAL #2   Title pt will complete HEP for dysarthria with modified independence   Time 4   Period Weeks   Status New   SLP SHORT TERM GOAL #3   Title pt will demo functional verbal expression, using compensations, in 10 minutes simple conversation   Time 4   Period Weeks   Status New          SLP Long Term Goals - 2015-06-23 1530    SLP LONG TERM GOAL #1   Title pt will complete HEP for dysphagia with modified independence   Time 8   Period Weeks   Status New   SLP LONG TERM GOAL #2   Title pt will demo compensations for speech intelligibility in 10 minutes mod complex conversation for intelligiblity >95%   Time 8   Period Weeks   Status New   SLP LONG TERM GOAL #3   Title pt will demo functional verbal expression, using compensations, in 10 minutes mod complex conversation   Time 10   Period Weeks   Status New          Plan - 23-Jun-2015 1521    Clinical Impression Statement Pt presents today with mild-mod dysarthria, reported expresive aphasia, and reported dysphagia. SLP recommended to neurologist who is seeing pt on Monday that pt be referred for modified barium swallow eval. Skilled ST is recommended at this time to improve speech intelligibility (pt reports people more frequently are asking her to repeat), to improve ability to find words, and to decr aspiration risk.    Speech Therapy Frequency 2x / week   Duration --  10 weeks   Treatment/Interventions Aspiration precaution training;Pharyngeal strengthening exercises;Diet toleration management by SLP;SLP instruction and feedback;Compensatory strategies;Internal/external aids;Patient/family education;Functional tasks;Cueing hierarchy  HEP   Potential to Achieve Goals Fair   Potential Considerations Severity of impairments   Consulted and Agree with Plan of Care  Patient          G-Codes - 23-Jun-2015 1532    Functional Assessment Tool Used noms- motor speech - 30% impaired   Functional Limitations Motor speech   Motor Speech Current Status [phone]) At least 20 percent but less than 40 percent impaired, limited or restricted   Motor Speech Goal Status [id number]) At least 1 percent but less than 20 percent impaired, limited or restricted      Problem List Patient Active Problem List   Diagnosis Date Noted  . Aortic stenosis 2015/06/23  . Dysphasia 06-23-15  . Aortic valve disorder 05/24/2014  . OSTEOPENIA 08/11/2009  . PEDAL EDEMA 07/05/2008  . BACK PAIN 12/17/2006  . Essential hypertension 11/21/2006  . Allergic rhinitis 11/21/2006  .  Osteoporosis 11/21/2006  . History of colonic polyps 11/21/2006    [facility] ,[place], [place]  06/09/2015, 3:34 PM  [facility] [address] [place], [name], [address] Phone: [phone]   Fax:  [phone]  Name: Tiffany [name] MRN: [id number] Date of Birth: Nov 11, 1932

## 2015-06-09 NOTE — Patient Instructions (Signed)
When you talk, open up your mouth more, it will help to make your speech clearer for other people to understand.  Continue to work those crossword puzzles!

## 2015-06-09 NOTE — Progress Notes (Signed)
Cardiology Office Note    Date:  06/09/2015   ID:  Tiffany [name], DOB December 23, 1932, MRN [id number]  PCP:  Nyoka Cowden, MD  Cardiologist:   Candee Furbish, MD     History of Present Illness:  Tiffany [name] is a 82 y.o. female here for the evaluation of aortic valve disorder. Systolic murmur was appreciated by Dr. Raliegh Ip and echocardiogram was last performed in early 2016 which showed moderate aortic stenosis.  Unfortunately she recently developed trouble with her speech, word finding difficulty and she was seen in the emergency department, [facility]. She still is having difficulty with this. She has been referred to a neurologist. Carotid Dopplers did not show significant carotid artery stenosis.  She denies any chest pain, syncope, shortness of breath. No bleeding. She does not that she has had a cough with eating. She may require a swallowing evaluation. She has been on ACE inhibitor for quite some time.    Past Medical History  Diagnosis Date  . ALLERGIC RHINITIS 11/21/2006  . BACK PAIN 12/17/2006  . COLONIC POLYPS, HX OF 11/21/2006  . HYPERTENSION 11/21/2006  . OSTEOPENIA 08/11/2009  . OSTEOPOROSIS 11/21/2006  . PEDAL EDEMA 07/05/2008  . SYSTOLIC MURMUR [id number]    Past Surgical History  Procedure Laterality Date  . Hemorrhoid surgery    . Tonsillectomy    . Breast surgery      bx  . Cataract extraction  2012    Outpatient Prescriptions Prior to Visit  Medication Sig Dispense Refill  . amLODipine (NORVASC) 2.5 MG tablet Take 1 tablet (2.5 mg total) by mouth daily. 90 tablet 3  . aspirin 81 MG tablet Take 81 mg by mouth daily.      Marland Kitchen atorvastatin (LIPITOR) 10 MG tablet Take 1 tablet (10 mg total) by mouth daily. 90 tablet 3  . benazepril (LOTENSIN) 40 MG tablet TAKE 1 TABLET EVERY DAY 90 tablet 1  . Calcium Carbonate (CALTRATE 600) 1500 MG TABS Take by mouth 2 (two) times daily.      . Cholecalciferol (VITAMIN D) 1000 UNITS capsule Take 1,000 Units by mouth  daily.      . hydrochlorothiazide (HYDRODIURIL) 25 MG tablet Take 1 tablet (25 mg total) by mouth daily. 90 tablet 1  . Multiple Vitamin (MULTIVITAMIN) tablet Take 1 tablet by mouth daily.      . Omega-3 Fatty Acids (FISH OIL) 1200 MG CAPS Take 1 capsule by mouth daily.     No facility-administered medications prior to visit.     Allergies:   Codeine phosphate   Social History   Social History  . Marital Status: Widowed    Spouse Name: N/A  . Number of Children: N/A  . Years of Education: N/A   Social History Main Topics  . Smoking status: Former Smoker    Quit date: 03/27/1995  . Smokeless tobacco: Never Used  . Alcohol Use: No  . Drug Use: No  . Sexual Activity: Not Asked   Other Topics Concern  . None   Social History Narrative     Family History:  The patient's She denies any family history of early coronary artery disease. Her father later in his years had CAD but does not remember aortic valve replacement.  ROS:   Please see the history of present illness.    ROS All other systems reviewed and are negative.   PHYSICAL EXAM:   VS:  BP 146/74 mmHg  Pulse 86  Ht 5\' 4"  (1.626 m)  Wt 161 lb 12.8 oz (73.392 kg)  BMI 27.76 kg/m2   GEN: Well nourished, well developed, in no acute distress HEENT: normal Neck: no JVD, carotid bruits, or masses Cardiac: RRR; 3/6 systolic right upper sternal border with radiation to the carotids murmur,no rubs, or gallops,no edema  Respiratory:  clear to auscultation bilaterally, normal work of breathing GI: soft, nontender, nondistended, + BS MS: no deformity or atrophy Skin: warm and dry, no rash Neuro:  Alert and Oriented x 3, Strength and sensation are intact, trouble with word finding noted. Ambulating well. Psych: euthymic mood, full affect  Wt Readings from Last 3 Encounters:  06/09/15 161 lb 12.8 oz (73.392 kg)  06/03/15 161 lb (73.029 kg)  10/28/14 165 lb (74.844 kg)      Studies/Labs Reviewed:   EKG:  EKG is  ordered today.  The ekg ordered today demonstrates 06/09/15 - sinus rhythm, 86.  Recent Labs: 12/03/2014: ALT 14; BUN 23; Creatinine, Ser 1.08; Hemoglobin 13.6; Platelets 189.0; Potassium 3.9; Sodium 141; TSH 2.04   Lipid Panel    Component Value Date/Time   CHOL 190 12/03/2014 0856   TRIG 124.0 12/03/2014 0856   HDL 56.60 12/03/2014 0856   CHOLHDL 3 12/03/2014 0856   VLDL 24.8 12/03/2014 0856   LDLCALC 108* 12/03/2014 0856    Additional studies/ records that were reviewed today include:  ECHO 05/24/14: - Left ventricle: The cavity size was normal. Wall thickness was increased in a pattern of mild LVH. Systolic function was normal. The estimated ejection fraction was in the range of 60% to 65%. Wall motion was normal; there were no regional wall motion abnormalities. Doppler parameters are consistent with abnormal left ventricular relaxation (grade 1 diastolic dysfunction). - Aortic valve: There was moderate stenosis. There was moderate regurgitation.    ASSESSMENT:    1. Essential hypertension      PLAN:  In order of problems listed above:  Aortic stenosis -Previously described as moderate -Loud murmur on exam with radiation to the carotids. -We will repeat echocardiogram -We will likely end up checking echocardiogram on a yearly basis. -Replacement of aortic valve, I explained to her the indications for symptoms, chest pain, shortness of breath, syncope. Right now she is asymptomatic.   Dysphasia  -She can understand words spoken to her clearly but she has trouble finding words and speaking. In review of prior notes, MRI was supposedly normal. Carotid Doppler only shows mild plaque. Dr. Raliegh Ip has referred to a neurologist. Aspirin 81 mg. EKG shows sinus rhythm. No complaints of palpitations.    Essential hypertension -Medications reviewed as above, well controlled  Hyperlipidemia -Atorvastatin 10 mg.  Medication Adjustments/Labs and Tests Ordered: Current  medicines are reviewed at length with the patient today.  Concerns regarding medicines are outlined above.  Medication changes, Labs and Tests ordered today are listed in the Patient Instructions below. Patient Instructions  Medication Instructions:  Your physician recommends that you continue on your current medications as directed. Please refer to the Current Medication list given to you today.   Labwork: None ordered  Testing/Procedures: Your physician has requested that you have an echocardiogram. Echocardiography is a painless test that uses sound waves to create images of your heart. It provides your doctor with information about the size and shape of your heart and how well your heart's chambers and valves are working. This procedure takes approximately one hour. There are no restrictions for this procedure.  Follow-Up: Your physician wants you to follow-up in: [place] DR.  [place] You will receive a reminder letter in the mail two months in advance. If you don't receive a letter, please call our office to schedule the follow-up appointment.    Any Other Special Instructions Will Be Listed Below (If Applicable). Echocardiogram An echocardiogram, or echocardiography, uses sound waves (ultrasound) to produce an image of your heart. The echocardiogram is simple, painless, obtained within a short period of time, and offers valuable information to your health care provider. The images from an echocardiogram can provide information such as:  Evidence of coronary artery disease (CAD).  Heart size.  Heart muscle function.  Heart valve function.  Aneurysm detection.  Evidence of a past heart attack.  Fluid buildup around the heart.  Heart muscle thickening.  Assess heart valve function. LET [facility] CARE PROVIDER KNOW ABOUT:  Any allergies you have.  All medicines you are taking, including vitamins, herbs, eye drops, creams, and over-the-counter medicines.  Previous  problems you or members of your family have had with the use of anesthetics.  Any blood disorders you have.  Previous surgeries you have had.  Medical conditions you have.  Possibility of pregnancy, if this applies. BEFORE THE PROCEDURE  No special preparation is needed. Eat and drink normally.  PROCEDURE   In order to produce an image of your heart, gel will be applied to your chest and a wand-like tool (transducer) will be moved over your chest. The gel will help transmit the sound waves from the transducer. The sound waves will harmlessly bounce off your heart to allow the heart images to be captured in real-time motion. These images will then be recorded.  You may need an IV to receive a medicine that improves the quality of the pictures. AFTER THE PROCEDURE You may return to your normal schedule including diet, activities, and medicines, unless your health care provider tells you otherwise.   This information is not intended to replace advice given to you by your health care provider. Make sure you discuss any questions you have with your health care provider.   Document Released: 03/09/2000 Document Revised: 04/02/2014 Document Reviewed: 11/17/2012 Elsevier Interactive Patient Education [employer].    If you need a refill on your cardiac medications before your next appointment, please call your pharmacy.         Bobby Rumpf, MD  06/09/2015 11:22 AM    [facility] [place], [place], [place]  [address] Phone: [phone]; Fax: [phone]

## 2015-06-09 NOTE — Patient Instructions (Addendum)
Medication Instructions:  Your physician recommends that you continue on your current medications as directed. Please refer to the Current Medication list given to you today.   Labwork: None ordered  Testing/Procedures: Your physician has requested that you have an echocardiogram. Echocardiography is a painless test that uses sound waves to create images of your heart. It provides your doctor with information about the size and shape of your heart and how well your heart's chambers and valves are working. This procedure takes approximately one hour. There are no restrictions for this procedure.  Follow-Up: Your physician wants you to follow-up in: 6 MONTHS WITH DR. Marlou Porch You will receive a reminder letter in the mail two months in advance. If you don't receive a letter, please call our office to schedule the follow-up appointment.    Any Other Special Instructions Will Be Listed Below (If Applicable). Echocardiogram An echocardiogram, or echocardiography, uses sound waves (ultrasound) to produce an image of your heart. The echocardiogram is simple, painless, obtained within a short period of time, and offers valuable information to your health care provider. The images from an echocardiogram can provide information such as:  Evidence of coronary artery disease (CAD).  Heart size.  Heart muscle function.  Heart valve function.  Aneurysm detection.  Evidence of a past heart attack.  Fluid buildup around the heart.  Heart muscle thickening.  Assess heart valve function. LET [facility] CARE PROVIDER KNOW ABOUT:  Any allergies you have.  All medicines you are taking, including vitamins, herbs, eye drops, creams, and over-the-counter medicines.  Previous problems you or members of your family have had with the use of anesthetics.  Any blood disorders you have.  Previous surgeries you have had.  Medical conditions you have.  Possibility of pregnancy, if this  applies. BEFORE THE PROCEDURE  No special preparation is needed. Eat and drink normally.  PROCEDURE   In order to produce an image of your heart, gel will be applied to your chest and a wand-like tool (transducer) will be moved over your chest. The gel will help transmit the sound waves from the transducer. The sound waves will harmlessly bounce off your heart to allow the heart images to be captured in real-time motion. These images will then be recorded.  You may need an IV to receive a medicine that improves the quality of the pictures. AFTER THE PROCEDURE You may return to your normal schedule including diet, activities, and medicines, unless your health care provider tells you otherwise.   This information is not intended to replace advice given to you by your health care provider. Make sure you discuss any questions you have with your health care provider.   Document Released: 03/09/2000 Document Revised: 04/02/2014 Document Reviewed: 11/17/2012 Elsevier Interactive Patient Education [employer].    If you need a refill on your cardiac medications before your next appointment, please call your pharmacy.

## 2015-06-13 ENCOUNTER — Other Ambulatory Visit: Payer: Commercial Managed Care - HMO

## 2015-06-13 ENCOUNTER — Other Ambulatory Visit (HOSPITAL_COMMUNITY): Payer: Self-pay | Admitting: Internal Medicine

## 2015-06-13 ENCOUNTER — Encounter: Payer: Self-pay | Admitting: Neurology

## 2015-06-13 ENCOUNTER — Ambulatory Visit (INDEPENDENT_AMBULATORY_CARE_PROVIDER_SITE_OTHER): Payer: Commercial Managed Care - HMO | Admitting: Neurology

## 2015-06-13 VITALS — BP 150/82 | HR 88 | Ht 64.0 in | Wt 151.0 lb

## 2015-06-13 DIAGNOSIS — G609 Hereditary and idiopathic neuropathy, unspecified: Secondary | ICD-10-CM

## 2015-06-13 DIAGNOSIS — R5382 Chronic fatigue, unspecified: Secondary | ICD-10-CM

## 2015-06-13 DIAGNOSIS — R292 Abnormal reflex: Secondary | ICD-10-CM | POA: Diagnosis not present

## 2015-06-13 DIAGNOSIS — R4702 Dysphasia: Secondary | ICD-10-CM

## 2015-06-13 DIAGNOSIS — R1319 Other dysphagia: Secondary | ICD-10-CM

## 2015-06-13 DIAGNOSIS — R251 Tremor, unspecified: Secondary | ICD-10-CM

## 2015-06-13 DIAGNOSIS — F458 Other somatoform disorders: Secondary | ICD-10-CM

## 2015-06-13 LAB — COMPREHENSIVE METABOLIC PANEL
ALT: 15 U/L (ref 6–29)
AST: 18 U/L (ref 10–35)
Albumin: 4.4 g/dL (ref 3.6–5.1)
Alkaline Phosphatase: 63 U/L (ref 33–130)
BUN: 16 mg/dL (ref 7–25)
CALCIUM: 10.2 mg/dL (ref 8.6–10.4)
CHLORIDE: 99 mmol/L (ref 98–110)
CO2: 30 mmol/L (ref 20–31)
Creat: 1.1 mg/dL — ABNORMAL HIGH (ref 0.60–0.88)
GLUCOSE: 90 mg/dL (ref 65–99)
POTASSIUM: 4.3 mmol/L (ref 3.5–5.3)
Sodium: 140 mmol/L (ref 135–146)
Total Bilirubin: 0.6 mg/dL (ref 0.2–1.2)
Total Protein: 7.3 g/dL (ref 6.1–8.1)

## 2015-06-13 LAB — PHOSPHORUS: PHOSPHORUS: 3 mg/dL (ref 2.1–4.3)

## 2015-06-13 LAB — VITAMIN B12: Vitamin B-12: 531 pg/mL (ref 200–1100)

## 2015-06-13 LAB — T4, FREE: Free T4: 1.2 ng/dL (ref 0.8–1.8)

## 2015-06-13 LAB — TSH: TSH: 1.35 mIU/L

## 2015-06-13 NOTE — Progress Notes (Signed)
Tiffany [name] Plumber was seen today in the movement disorders clinic for neurologic consultation at the request of Nyoka Cowden, MD.  The patient is seen today in neurologic consultation for the subacute evaluation of gait and speech changes for the last several months.  Looking back, she thinks that her handwriting began to change after Thanksgiving some time.  She remembers it being difficult to write her cards at thanksgiving, as the handwriting was smaller and less fluid.  She thinks that she began to have difficulty with word finding about 2 months ago.  She presented to the emergency room at [facility] in [place] on February 27 and I reviewed those records.  She had an MRI of the brain there and I do not have the films, but I do have the report.  It was nonacute and only revealed mild white matter changes.  She was told to follow up with ENT.  She did not have an ENT consult but she did follow up with her primary care physician on 06/03/2015 and was set up with a speech therapy evaluation, cardiology follow-up and was referred here.  She had a carotid ultrasound on 06/06/2015 which just demonstrated 1-39% stenosis.  I reviewed her cardiology follow-up from 06/09/2015 and they did not think that her new symptoms were related to her aortic stenosis.  Her repeat echocardiogram is pending.  I did get a note from her speech therapist and reviewed their records as well.  It was felt that she had both dysphasia as well as dysphagia and a modified barium swallow as well as traditional speech, occupational and physical therapy are being recommended.  Denies muscles jumping.  Is having trouble swallowing.  Only new medication is Lipitor and she started that a few weeks ago.     Specific Symptoms:  Voice: states that her voice is softer Sleep: trouble getting and staying asleep  Vivid Dreams:  No.  Acting out dreams:  No. Wet Pillows: Yes.   Postural symptoms:  Yes.    Falls?  No. Bradykinesia  symptoms: difficulty getting out of a chair Loss of smell:  Yes.   Loss of taste:  Yes.   Urinary Incontinence:  No. Difficulty Swallowing:  Yes.   (x few months; solids, liquids, saliva, pills) Handwriting, micrographia: Yes.   Trouble with ADL's:  Yes.  , slower but able to do it  Trouble buttoning clothing: No. (slower but able to do it) Depression:  No. Memory changes:  No. (just word finding trouble) Hallucinations:  No.  visual distortions: No. N/V:  No. Lightheaded:  No.  Syncope: No. Diplopia:  No. Dyskinesia:  No.    ALLERGIES:   Allergies  Allergen Reactions  . Codeine Phosphate     CURRENT MEDICATIONS:  Outpatient Encounter Prescriptions as of 06/13/2015  Medication Sig  . amLODipine (NORVASC) 2.5 MG tablet Take 1 tablet (2.5 mg total) by mouth daily.  Marland Kitchen aspirin 81 MG tablet Take 81 mg by mouth daily.    Marland Kitchen atorvastatin (LIPITOR) 10 MG tablet Take 1 tablet (10 mg total) by mouth daily.  . benazepril (LOTENSIN) 40 MG tablet TAKE 1 TABLET EVERY DAY  . Calcium Carbonate (CALTRATE 600) 1500 MG TABS Take by mouth 2 (two) times daily.    . Cholecalciferol (VITAMIN D) 1000 UNITS capsule Take 1,000 Units by mouth daily.    . hydrochlorothiazide (HYDRODIURIL) 25 MG tablet Take 1 tablet (25 mg total) by mouth daily.  . Multiple Vitamin (MULTIVITAMIN) tablet Take 1 tablet by  mouth daily.    . Omega-3 Fatty Acids (FISH OIL) 1200 MG CAPS Take 1 capsule by mouth daily.   No facility-administered encounter medications on file as of 06/13/2015.    PAST MEDICAL HISTORY:   Past Medical History  Diagnosis Date  . ALLERGIC RHINITIS 11/21/2006  . BACK PAIN 12/17/2006  . COLONIC POLYPS, HX OF 11/21/2006  . HYPERTENSION 11/21/2006  . OSTEOPENIA 08/11/2009  . OSTEOPOROSIS 11/21/2006  . PEDAL EDEMA 07/05/2008  . SYSTOLIC MURMUR [id number]    PAST SURGICAL HISTORY:   Past Surgical History  Procedure Laterality Date  . Hemorrhoid surgery    . Tonsillectomy    . Breast surgery       bx  . Cataract extraction Bilateral 2012    SOCIAL HISTORY:   Social History   Social History  . Marital Status: Widowed    Spouse Name: N/A  . Number of Children: N/A  . Years of Education: N/A   Occupational History  . retired     Press photographer   Social History Main Topics  . Smoking status: Former Smoker    Quit date: 03/27/1995  . Smokeless tobacco: Never Used  . Alcohol Use: No  . Drug Use: No  . Sexual Activity: Not on file   Other Topics Concern  . Not on file   Social History Narrative    FAMILY HISTORY:   Family Status  Relation Status Death Age  . Mother Deceased 84    ovarian ca  . Father Deceased 2    cad  . Sister Alive     3, one with breast CA  . Sister Deceased     17, pancreatic CA, brain tumor  . Brother Alive     1, CAD  . Brother Deceased     22  . Child Alive     2 sons, alive and well    ROS:  A complete 10 system review of systems was obtained and was unremarkable apart from what is mentioned above.  PHYSICAL EXAMINATION:    VITALS:   Filed Vitals:   06/13/15 1233  BP: 150/82  Pulse: 88  Height: 5\' 4"  (1.626 m)  Weight: 151 lb (68.493 kg)    GEN:  The patient appears stated age and is in NAD. HEENT:  Normocephalic, atraumatic.  The mucous membranes are moist. The superficial temporal arteries are without ropiness or tenderness. CV:  RRR with 3/6 SEM that radiates to the right carotid. Lungs:  CTAB but she has some laryngeal stridor Neck/HEME:  There are no carotid bruits bilaterally.  Neurological examination:  Orientation: The patient is alert and oriented x3. Fund of knowledge is appropriate.  Recent and remote memory are intact.  Attention and concentration are normal.    Able to name objects and repeat phrases but she is very slow in doing this and sometimes will say "yes" when she means "no" and vice versa but will go back and correct herself. Cranial nerves: There is good facial symmetry. She has facial hypomimia.  Pupils are  equal round and reactive to light bilaterally. Fundoscopic exam reveals clear margins bilaterally. Extraocular muscles are intact but she does have some trouble with both upgaze and downgaze. She has significant square wave jerks.  The visual fields are full to confrontational testing. The speech is nonfluent but it is fairly clear. She has intermittent grunting.  The soft palate rises symmetrically and there is no tongue deviation. Hearing is intact to conversational tone. Sensation: Sensation is intact  to light and pinprick throughout (facial, trunk, extremities). Vibration is decreased at the bilateral big toe. There is no extinction with double simultaneous stimulation. There is no sensory dermatomal level identified.  No graphesthesia or stereognosis.   Motor: Strength is 5/5 in the bilateral upper and lower extremities.   Tongue strength is good.  Shoulder shrug is equal and symmetric.  There is no pronator drift.  No fasciculations noted anywhere, including the tongue. Deep tendon reflexes: Deep tendon reflexes are 2+-3/4 at the bilateral biceps, triceps, brachioradialis, patella and achilles. Plantar responses are downgoing bilaterally.  Movement examination: Tone: There is a hint of rigidity in the right upper extremity, but nowhere else.  There is no spasticity noted. Abnormal movements: None Coordination:  There is  decremation with RAM's, with any form of RAMS, including alternating supination and pronation of the forearm, hand opening and closing, finger taps, heel taps and toe taps bilaterally Gait and Station: The patient has no difficulty arising out of a deep-seated chair without the use of the hands. The patient's stride length is normal with slight camptocormia to the right.  She is ataxic.     Labs:    Chemistry      Component Value Date/Time   NA 141 12/03/2014 0856   K 3.9 12/03/2014 0856   CL 102 12/03/2014 0856   CO2 32 12/03/2014 0856   BUN 23 12/03/2014 0856    CREATININE 1.08 12/03/2014 0856      Component Value Date/Time   CALCIUM 9.9 12/03/2014 0856   ALKPHOS 64 12/03/2014 0856   AST 17 12/03/2014 0856   ALT 14 12/03/2014 0856   BILITOT 0.5 12/03/2014 0856     Lab Results  Component Value Date   TSH 2.04 12/03/2014      ASSESSMENT/PLAN:  1.  This 82 year old female presents with a subacute to chronic onset of dysphasia, dysphagia and gait instability with progressive supranuclear palsy, primary lateral sclerosis, and primary progressive aphasia being high among the list of differential diagnoses (although PPA can be associated with PSP as well as MND).  -The patient will have the following lab work: Labs:  B12, Tsh, Free T4, SPEP and UPEP with immunofixation, calcium, phosphorous, PTH, Copper, Lyme, Heavy metal screen  -She will have an MRI of the cervical spine  -I will try to get a copy of her MRI films from [facility]  -She will have an EMG  -She will have a dat scan  -Refer for MBS  -PT/OT/ST  -we may need neuropsych in the future but will do the above first 2.  Follow up after the above is completed.  Much greater than 50% of this visit was spent in counseling and coordinating care.  Total face to face time:  60 min

## 2015-06-13 NOTE — Patient Instructions (Addendum)
1. We have scheduled you at [facility] for your modified barium swallow on Monday 06/27/2015 at 11:00 am. Please arrive 15 minutes prior and go to 1st floor radiology. If you need to reschedule for any reason please call [phone]. 2. We will call you with appt for your EMG.  3. We are referring you to [facility] for a DaT scan. They will contact you directly to set up a time for this testing. If you do not hear from them they can be contacted at [phone]. 4. We have scheduled you at [facility] for your MRI on 06/29/15 at 1:00 pm. Please arrive 15 minutes prior and go to 1st floor radiology. If you need to reschedule for any reason please call [phone]. 5. Your provider has requested that you have labwork completed today. Please go to [facility] ([id number]) on the second floor of this building before leaving the office today. You do not need to check in. If you are not called within 15 minutes please check with the front desk.

## 2015-06-14 ENCOUNTER — Other Ambulatory Visit (HOSPITAL_COMMUNITY): Payer: Self-pay | Admitting: Internal Medicine

## 2015-06-14 ENCOUNTER — Other Ambulatory Visit: Payer: Self-pay

## 2015-06-14 ENCOUNTER — Telehealth: Payer: Self-pay | Admitting: Neurology

## 2015-06-14 DIAGNOSIS — Z1231 Encounter for screening mammogram for malignant neoplasm of breast: Secondary | ICD-10-CM

## 2015-06-14 DIAGNOSIS — R1310 Dysphagia, unspecified: Secondary | ICD-10-CM

## 2015-06-14 LAB — PTH, INTACT AND CALCIUM
CALCIUM: 10.2 mg/dL (ref 8.4–10.5)
PTH: 34 pg/mL (ref 14–64)

## 2015-06-14 LAB — LYME AB/WESTERN BLOT REFLEX: B burgdorferi Ab IgG+IgM: <0.9 Index (ref <0.90)

## 2015-06-14 NOTE — Telephone Encounter (Signed)
Patient needs DaT scan scheduled. Referral, records, and authorization faxed to [facility] at [phone] with confirmation received. They will call patient directly to schedule.

## 2015-06-15 ENCOUNTER — Telehealth: Payer: Self-pay | Admitting: Internal Medicine

## 2015-06-15 ENCOUNTER — Encounter: Payer: Self-pay | Admitting: Neurology

## 2015-06-15 LAB — PROTEIN ELECTROPHORESIS,RANDOM URN
CREATININE, URINE: 42 mg/dL (ref 20–320)
Protein Creatinine Ratio: 119 mg/g creat (ref 21–161)
TOTAL PROTEIN, URINE: 5 mg/dL (ref 5–24)

## 2015-06-15 LAB — PROTEIN ELECTROPHORESIS, SERUM
ALBUMIN ELP: 4.1 g/dL (ref 3.8–4.8)
ALPHA-1-GLOBULIN: 0.4 g/dL — AB (ref 0.2–0.3)
Alpha-2-Globulin: 0.9 g/dL (ref 0.5–0.9)
BETA 2: 0.4 g/dL (ref 0.2–0.5)
Beta Globulin: 0.5 g/dL (ref 0.4–0.6)
Gamma Globulin: 1.2 g/dL (ref 0.8–1.7)
Total Protein, Serum Electrophoresis: 7.3 g/dL (ref 6.1–8.1)

## 2015-06-15 LAB — COPPER, SERUM: COPPER: 135 ug/dL (ref 72–166)

## 2015-06-15 LAB — IMMUNOFIXATION ELECTROPHORESIS
IGA: 268 mg/dL (ref 69–380)
IGG (IMMUNOGLOBIN G), SERUM: 1300 mg/dL (ref 690–1700)
IGM, SERUM: 87 mg/dL (ref 52–322)

## 2015-06-15 LAB — HEAVY METALS, BLOOD

## 2015-06-15 LAB — IMMUNOFIXATION INTE

## 2015-06-15 NOTE — Telephone Encounter (Signed)
Pt is returning donna call °

## 2015-06-20 ENCOUNTER — Ambulatory Visit (HOSPITAL_COMMUNITY): Payer: Commercial Managed Care - HMO

## 2015-06-20 ENCOUNTER — Encounter: Payer: Self-pay | Admitting: *Deleted

## 2015-06-20 NOTE — Telephone Encounter (Signed)
Letter sent to pt, see result note.

## 2015-06-27 ENCOUNTER — Ambulatory Visit (HOSPITAL_COMMUNITY): Payer: Commercial Managed Care - HMO

## 2015-06-27 ENCOUNTER — Other Ambulatory Visit (HOSPITAL_COMMUNITY): Payer: Commercial Managed Care - HMO

## 2015-06-28 ENCOUNTER — Ambulatory Visit: Payer: Commercial Managed Care - HMO

## 2015-06-28 ENCOUNTER — Telehealth: Payer: Self-pay | Admitting: Neurology

## 2015-06-28 DIAGNOSIS — G609 Hereditary and idiopathic neuropathy, unspecified: Secondary | ICD-10-CM

## 2015-06-28 DIAGNOSIS — R1310 Dysphagia, unspecified: Secondary | ICD-10-CM

## 2015-06-28 DIAGNOSIS — R292 Abnormal reflex: Secondary | ICD-10-CM

## 2015-06-28 NOTE — Telephone Encounter (Signed)
Order entered

## 2015-06-28 NOTE — Telephone Encounter (Signed)
-----   Message from [place], DO sent at 06/28/2015  3:45 PM EDT ----- See my note.  thx  ----- Message -----    From: Sharen Counter, CCC-SLP    Sent: 06/28/2015   1:25 PM      To: Eustace Quail Tat, DO  Dr. Carles Collet- In your note you state you would like PT and OT evals scheduled for Tiffany [name], but no orders have shown up in EPIC.  Just wanted to make sure she doesn't fall through the cracks.  Her MBS is scheduled for tomorrow.  Thanks. Glendell Docker

## 2015-06-29 ENCOUNTER — Ambulatory Visit (HOSPITAL_COMMUNITY)
Admission: RE | Admit: 2015-06-29 | Discharge: 2015-06-29 | Disposition: A | Discharge status: Home / Self Care | Payer: Commercial Managed Care - HMO | Source: Ambulatory Visit | Attending: Neurology | Admitting: Neurology

## 2015-06-29 ENCOUNTER — Ambulatory Visit (HOSPITAL_COMMUNITY)
Admission: RE | Admit: 2015-06-29 | Discharge: 2015-06-29 | Disposition: A | Discharge status: Home / Self Care | Payer: Commercial Managed Care - HMO | Source: Ambulatory Visit | Attending: Internal Medicine | Admitting: Internal Medicine

## 2015-06-29 ENCOUNTER — Ambulatory Visit (HOSPITAL_BASED_OUTPATIENT_CLINIC_OR_DEPARTMENT_OTHER): Payer: Commercial Managed Care - HMO

## 2015-06-29 ENCOUNTER — Other Ambulatory Visit: Payer: Self-pay

## 2015-06-29 DIAGNOSIS — G609 Hereditary and idiopathic neuropathy, unspecified: Secondary | ICD-10-CM | POA: Insufficient documentation

## 2015-06-29 DIAGNOSIS — I10 Essential (primary) hypertension: Secondary | ICD-10-CM | POA: Diagnosis not present

## 2015-06-29 DIAGNOSIS — T17300A Unspecified foreign body in larynx causing asphyxiation, initial encounter: Secondary | ICD-10-CM | POA: Diagnosis not present

## 2015-06-29 DIAGNOSIS — M4802 Spinal stenosis, cervical region: Secondary | ICD-10-CM | POA: Diagnosis not present

## 2015-06-29 DIAGNOSIS — M50321 Other cervical disc degeneration at C4-C5 level: Secondary | ICD-10-CM | POA: Diagnosis not present

## 2015-06-29 DIAGNOSIS — M50223 Other cervical disc displacement at C6-C7 level: Secondary | ICD-10-CM | POA: Diagnosis not present

## 2015-06-29 DIAGNOSIS — R292 Abnormal reflex: Secondary | ICD-10-CM | POA: Diagnosis not present

## 2015-06-29 DIAGNOSIS — R4702 Dysphasia: Secondary | ICD-10-CM

## 2015-06-29 DIAGNOSIS — R1310 Dysphagia, unspecified: Secondary | ICD-10-CM | POA: Diagnosis not present

## 2015-06-29 DIAGNOSIS — I351 Nonrheumatic aortic (valve) insufficiency: Secondary | ICD-10-CM | POA: Insufficient documentation

## 2015-06-29 DIAGNOSIS — R251 Tremor, unspecified: Secondary | ICD-10-CM | POA: Insufficient documentation

## 2015-06-29 DIAGNOSIS — R1319 Other dysphagia: Secondary | ICD-10-CM | POA: Insufficient documentation

## 2015-06-29 DIAGNOSIS — R5382 Chronic fatigue, unspecified: Secondary | ICD-10-CM | POA: Diagnosis not present

## 2015-06-29 NOTE — Progress Notes (Signed)
MBSS complete. Full report located under chart review in imaging section.  Weldon Inches, MA, CCC-SLP

## 2015-06-30 ENCOUNTER — Telehealth: Payer: Self-pay | Admitting: Neurology

## 2015-06-30 NOTE — Telephone Encounter (Signed)
-----   Message from [place], DO sent at 06/30/2015  9:10 AM EDT ----- Reviewed films.  Please let pt know that she definitely has some abnormalities on cervical spine with degen changes and disc disease that causes some spinal stenosis and also causes narrowing where the nerve roots go to the arms.  This certainly can cause problems and may need neurosx opinion in future but not sure if this explains all problems.  Lets wait until some of other tests done before we send her as I know she has upcoming EMG.

## 2015-06-30 NOTE — Telephone Encounter (Signed)
Tried to call patient with no answer and no voicemail. Will try again later.  

## 2015-06-30 NOTE — Telephone Encounter (Signed)
Patient made aware. We will call after other results are in.

## 2015-07-05 ENCOUNTER — Encounter: Payer: Commercial Managed Care - HMO | Admitting: Neurology

## 2015-07-05 DIAGNOSIS — R948 Abnormal results of function studies of other organs and systems: Secondary | ICD-10-CM | POA: Diagnosis not present

## 2015-07-05 DIAGNOSIS — R251 Tremor, unspecified: Secondary | ICD-10-CM | POA: Diagnosis not present

## 2015-07-09 ENCOUNTER — Other Ambulatory Visit: Payer: Self-pay | Admitting: Internal Medicine

## 2015-07-12 ENCOUNTER — Telehealth: Payer: Self-pay | Admitting: Neurology

## 2015-07-12 ENCOUNTER — Ambulatory Visit: Payer: Commercial Managed Care - HMO | Attending: Internal Medicine

## 2015-07-12 ENCOUNTER — Ambulatory Visit (INDEPENDENT_AMBULATORY_CARE_PROVIDER_SITE_OTHER): Payer: Commercial Managed Care - HMO | Admitting: Neurology

## 2015-07-12 DIAGNOSIS — R1310 Dysphagia, unspecified: Secondary | ICD-10-CM

## 2015-07-12 DIAGNOSIS — R292 Abnormal reflex: Secondary | ICD-10-CM

## 2015-07-12 DIAGNOSIS — R471 Dysarthria and anarthria: Secondary | ICD-10-CM

## 2015-07-12 DIAGNOSIS — R29898 Other symptoms and signs involving the musculoskeletal system: Secondary | ICD-10-CM | POA: Insufficient documentation

## 2015-07-12 DIAGNOSIS — F458 Other somatoform disorders: Secondary | ICD-10-CM | POA: Diagnosis not present

## 2015-07-12 DIAGNOSIS — R29818 Other symptoms and signs involving the nervous system: Secondary | ICD-10-CM | POA: Insufficient documentation

## 2015-07-12 DIAGNOSIS — R251 Tremor, unspecified: Secondary | ICD-10-CM

## 2015-07-12 DIAGNOSIS — R278 Other lack of coordination: Secondary | ICD-10-CM | POA: Insufficient documentation

## 2015-07-12 DIAGNOSIS — R279 Unspecified lack of coordination: Secondary | ICD-10-CM | POA: Diagnosis not present

## 2015-07-12 DIAGNOSIS — R1319 Other dysphagia: Secondary | ICD-10-CM

## 2015-07-12 DIAGNOSIS — R2681 Unsteadiness on feet: Secondary | ICD-10-CM | POA: Insufficient documentation

## 2015-07-12 DIAGNOSIS — R2689 Other abnormalities of gait and mobility: Secondary | ICD-10-CM | POA: Insufficient documentation

## 2015-07-12 DIAGNOSIS — R4702 Dysphasia: Secondary | ICD-10-CM

## 2015-07-12 DIAGNOSIS — R4701 Aphasia: Secondary | ICD-10-CM | POA: Diagnosis not present

## 2015-07-12 DIAGNOSIS — R5382 Chronic fatigue, unspecified: Secondary | ICD-10-CM

## 2015-07-12 DIAGNOSIS — G609 Hereditary and idiopathic neuropathy, unspecified: Secondary | ICD-10-CM

## 2015-07-12 NOTE — Therapy (Signed)
[facility] [address] [place], [name], [address] Phone: [phone]   Fax:  [phone]  Speech Language Pathology Treatment  Patient Details  Name: Tiffany [name] MRN: [id number] Date of Birth: February 10, 1933 Referring Provider: Bluford Kaufmann, M.D.  Encounter Date: 07/12/2015      End of Session - 07/12/15 1631    Visit Number 2   Number of Visits 16   Date for SLP Re-Evaluation 08/12/15   SLP Start Time [id number]   SLP Stop Time  1445   SLP Time Calculation (min) 42 min   Activity Tolerance Patient tolerated treatment well      Past Medical History  Diagnosis Date  . ALLERGIC RHINITIS 11/21/2006  . BACK PAIN 12/17/2006  . COLONIC POLYPS, HX OF 11/21/2006  . HYPERTENSION 11/21/2006  . OSTEOPENIA 08/11/2009  . OSTEOPOROSIS 11/21/2006  . PEDAL EDEMA 07/05/2008  . SYSTOLIC MURMUR [id number]    Past Surgical History  Procedure Laterality Date  . Hemorrhoid surgery    . Tonsillectomy    . Breast surgery      bx  . Cataract extraction Bilateral 2012    There were no vitals filed for this visit.      Subjective Assessment - 07/12/15 1350    Subjective "She said not to drink any water." (pt's recommendations were for chin tuck with thin liquids) "How am I going to remember (to put my chin down)?"               ADULT SLP TREATMENT - 07/12/15 1410    General Information   Behavior/Cognition Alert;Cooperative;Pleasant mood   Treatment Provided   Treatment provided Dysphagia;Cognitive-Linquistic   Dysphagia Treatment   Temperature Spikes Noted No   Respiratory Status Room air   Treatment Methods Compensation strategy training   Patient observed directly with PO's Yes   Type of PO's observed Thin liquids   Pharyngeal Phase Signs & Symptoms Immediate throat clear;Immediate cough  throat clear x2, cough x1   Type of cueing Verbal;Visual   Amount of cueing --  usual cues   Other treatment/comments  Pt told SLP meds in puree and no straws.    Pain Assessment   Pain Assessment No/denies pain   Cognitive-Linquistic Treatment   Treatment focused on Dysarthria   Skilled Treatment Pt was educated on an HEP for habitualizing dysarthria compensations. She req'd mod cues consistently for overarticulation. "This will take a long time" (pt, re: phrase list). SLP reiterated to pt it should not take >20 minutes to perform HEP.   Assessment / Recommendations / Plan   Plan Continue with current plan of care   Dysphagia Recommendations   Diet recommendations Regular;Thin liquid   Liquids provided via Cup   Medication Administration Whole meds with puree   Compensations Slow rate;Small sips/bites   Postural Changes and/or Swallow Maneuvers Chin tuck   Progression Toward Goals   Progression toward goals Progressing toward goals          SLP Education - 07/12/15 1630    Education provided Yes   Education Details HEP-dysarthria compensations, recommendations/precautions from modified barium swallow   Person(s) Educated Patient   Methods Explanation;Demonstration;Verbal cues;Handout   Comprehension Verbalized understanding;Returned demonstration;Verbal cues required;Need further instruction          SLP Short Term Goals - 07/12/15 1429    SLP SHORT TERM GOAL #1   Title pt will demo compensations for speech intelligibility in 10 minutes simple conversation for intelligiblity >95%   Time  4   Period Weeks   Status On-going   SLP SHORT TERM GOAL #2   Title pt will complete HEP for dysarthria with modified independence   Time 4   Period Weeks   Status On-going   SLP SHORT TERM GOAL #3   Title pt will demo functional verbal expression, using compensations, in 10 minutes simple conversation   Time 4   Period Weeks   Status On-going   SLP SHORT TERM GOAL #4   Title pt will demo appropriate tuck posture for chin down with thin liquids during sessions 95% of the time with rare min A   Time 4    Period Weeks   Status New          SLP Long Term Goals - 07/12/15 1646    SLP LONG TERM GOAL #1   Title pt will complete HEP for dysphagia with modified independence   Time 8   Period Weeks   Status On-going   SLP LONG TERM GOAL #2   Title pt will demo compensations for speech intelligibility in 10 minutes mod complex conversation for intelligiblity >95%   Time 8   Period Weeks   Status On-going   SLP LONG TERM GOAL #3   Title pt will demo functional verbal expression, using compensations, in 10 minutes mod complex conversation   Time 10   Period Weeks   Status On-going   SLP LONG TERM GOAL #4   Title pt will demo compensations for dysphagia in order to decr risk of aspiration 90% of the time with POs during 3 sessions   Time 10   Period Weeks   Status New          Plan - 07/12/15 1641    Clinical Impression Statement Mod dysarthria cont. Pt's modified barium swallow (MBSS) exam (06/28/15) showed mod pharyngeal dysphagia. See MBSS report under 'imaging" for details. Dysphagia compensation goal added to STGs. Aspiration with thin, chin tuck eliminated. Rec: regular with thin using chin tuck, no straws, meds with puree, small sips/bites. Pt req'd cues from SLP today for chin tuck with H2O, and consistent mod cues for using overarticulation as compensations for dysarthria using HEP ("tongue twisters" HEP).   Speech Therapy Frequency 2x / week   Duration --  10 weeks   Treatment/Interventions Aspiration precaution training;Pharyngeal strengthening exercises;Diet toleration management by SLP;SLP instruction and feedback;Compensatory strategies;Internal/external aids;Patient/family education;Functional tasks;Cueing hierarchy   Potential to Achieve Goals Fair   Potential Considerations Severity of impairments   Consulted and Agree with Plan of Care Patient      Patient will benefit from skilled therapeutic intervention in order to improve the following deficits and impairments:    Dysphagia  Aphasia  Dysarthria and anarthria    Problem List Patient Active Problem List   Diagnosis Date Noted  . Aortic stenosis 06/09/2015  . Dysphasia 06/09/2015  . Aortic valve disorder 05/24/2014  . OSTEOPENIA 08/11/2009  . PEDAL EDEMA 07/05/2008  . BACK PAIN 12/17/2006  . Essential hypertension 11/21/2006  . Allergic rhinitis 11/21/2006  . Osteoporosis 11/21/2006  . History of colonic polyps 11/21/2006    [facility] ,[place], [place]  07/12/2015, 4:48 PM  [facility] [address] [place], [name], [address] Phone: [phone]   Fax:  [phone]   Name: Tiffany [name] MRN: [id number] Date of Birth: November 12, 1932

## 2015-07-12 NOTE — Patient Instructions (Signed)
Speech Exercises  Repeat 2 times, 2 times a day  Call the cat "Buttercup" A calendar of [place], [place] Four floors to cover Yellow oil ointment Fellow lovers of felines Catastrophe in [place] Plump plumbers' plums The church's chimes chimed Telling time 'til eleven Five valve levers Keep the gate closed Go see that guy Fat cows give milk [place] [employer] Fat frogs flip freely Tuck Tommy into bed Get that game to Greg Thick thistles stick together Cinnamon aluminum linoleum Black bugs blood Lovely lemon linament Red leather, yellow leather  Big grocery buggy    Purple baby carriage [place] [employer] Proper copper coffee pot Ripe purple cabbage Three free throws Tim Tebow tackled  [place] Dave dipped the dessert  [employer]ils Buckle that bracket    The gospel of Mark Shirts shrink, shells shouldn't [place] [employer]ers Take the tackle box File the flash message Give me five flapjacks Fundamental relatives Dye the pets purple Talking turkey time after time Dark chocolate chunks Political landscape of the kingdom Electrical engineering genius We played yo-yos yesterday  

## 2015-07-12 NOTE — Procedures (Signed)
[facility]  [place] [place]  [place], [place] [address] Tel: [phone] Fax:  [phone] Test Date:  07/12/2015  Patient: Tiffany [name] DOB: April 17, 1932 Physician: Narda Amber, DO  Sex: Female Height: 5\' 4"  Ref Phys: Alonza Bogus, M.D.  ID#: [id number] Temp: 32.6C Technician: Jerilynn Mages. Dean   Patient Complaints: This is an 82 year old female referred for progressive dysarthria and dysphasia to evaluate for motor neuron disease.  NCV & EMG Findings: Extensive electrodiagnostic testing of the right upper extremity, right lower extremity, midthoracic paraspinal muscles (T7 and T11 levels), and bulbar muscles shows: 1. Right median, ulnar, and radial sensory responses are within normal limits. 2. Right median and ulnar motor responses are within normal limits. 3. Bilateral sural and superficial peroneal sensory responses are absent; these findings can be normal findings in patient's of this age range. 4. Right peroneal and tibial motor responses are within normal limits. 5. Bilateral tibial H reflex studies are within normal limits. 6. There is no evidence of active or chronic motor axon loss changes affecting any of the tested muscles, including the midthoracic paraspinal muscles and bulbar muscles (mentalis and hyoglossus). Motor unit configuration and recruitment pattern is within normal limits. 7. There are no fasciculation potentials seen in any of the tested muscles.  Impression: This is essentially a normal study.  In particular, there is no evidence of a widespread disorder of anterior horn cells, cervical/lumbosacral radiculopathy, or sensorimotor neuropathy.  In isolation, absent sensory responses bilaterally are most likely age-appropriate findings and does not suggest a sensory polyneuropathy.   ___________________________ Narda Amber, DO    Nerve Conduction Studies Anti Sensory Summary Table   Site NR Peak (ms) Norm Peak (ms) P-T Amp (V) Norm P-T  Amp  Right Median Anti Sensory (2nd Digit)  32.6C  Wrist    3.7 <3.8 19.8 >10  Right Radial Anti Sensory (Base 1st Digit)  32.6C  Wrist    2.2 <2.8 24.8 >10  Left Sup Peroneal Anti Sensory (Ant Lat Mall)  32.6C  12 cm NR  <4.6  >3  Right Sup Peroneal Anti Sensory (Ant Lat Mall)  32.6C  12 cm NR  <4.6  >3  Left Sural Anti Sensory (Lat Mall)  32.6C  Calf NR  <4.6  >3  Right Sural Anti Sensory (Lat Mall)  32.6C  Calf NR  <4.6  >3  Right Ulnar Anti Sensory (5th Digit)  32.6C  Wrist    3.1 <3.2 11.9 >5   Motor Summary Table   Site NR Onset (ms) Norm Onset (ms) O-P Amp (mV) Norm O-P Amp Site1 Site2 Delta-0 (ms) Dist (cm) Vel (m/s) Norm Vel (m/s)  Right Median Motor (Abd Poll Brev)  32.6C  Wrist    3.8 <4.0 7.8 >5 Elbow Wrist 4.3 22.0 51 >50  Elbow    8.1  7.5         Right Peroneal Motor (Ext Dig Brev)  32.6C  Ankle    4.8 <6.0 3.5 >2.5 B Fib Ankle 6.3 31.0 49 >40  B Fib    11.1  3.1  Poplt B Fib 1.9 10.0 53 >40  Poplt    13.0  3.1         Right Tibial Motor (Abd Hall Brev)  32.6C  Ankle    4.4 <6.0 6.9 >4 Knee Ankle 9.3 38.0 41 >40  Knee    13.7  3.0         Right Ulnar Motor (Abd Dig Minimi)  32.6C  Wrist    3.0 <3.1 8.5 >7 B Elbow Wrist 3.7 20.0 54 >50  B Elbow    6.7  7.3  A Elbow B Elbow 1.8 10.0 56 >50  A Elbow    8.5  6.8          H Reflex Studies   NR H-Lat (ms) Lat Norm (ms) L-R H-Lat (ms) M-Lat (ms) HLat-MLat (ms)  Left Tibial (Gastroc)  32.6C     33.47 <35 0.82 5.99 27.48  Right Tibial (Gastroc)  32.6C     34.29 <35 0.82 4.76 29.53   EMG   Side Muscle Ins Act Fibs Psw Fasc Number Recrt Dur Dur. Amp Amp. Poly Poly. Comment  Right AntTibialis Nml Nml Nml Nml Nml Nml Nml Nml Nml Nml Nml Nml N/A  Right Gastroc Nml Nml Nml Nml Nml Nml Nml Nml Nml Nml Nml Nml N/A  Right Flex Dig Long Nml Nml Nml Nml Nml Nml Nml Nml Nml Nml Nml Nml N/A  Right RectFemoris Nml Nml Nml Nml Nml Nml Nml Nml Nml Nml Nml Nml N/A  Right BicepsFemS Nml Nml Nml Nml Nml Nml Nml Nml  Nml Nml Nml Nml N/A  Right GluteusMed Nml Nml Nml Nml Nml Nml Nml Nml Nml Nml Nml Nml N/A  Right Lumbo Parasp Low Nml Nml Nml Nml Nml Nml Nml Nml Nml Nml Nml Nml N/A  Right T7 Parasp Nml Nml Nml Nml Nml Nml Nml Nml Nml Nml Nml Nml N/A  Right T11 Parasp Nml Nml Nml Nml Nml Nml Nml Nml Nml Nml Nml Nml N/A  Right 1stDorInt Nml Nml Nml Nml Nml Nml Nml Nml Nml Nml Nml Nml N/A  Right PronatorTeres Nml Nml Nml Nml Nml Nml Nml Nml Nml Nml Nml Nml N/A  Right Biceps Nml Nml Nml Nml Nml Nml Nml Nml Nml Nml Nml Nml N/A  Right Triceps Nml Nml Nml Nml Nml Nml Nml Nml Nml Nml Nml Nml N/A  Right Deltoid Nml Nml Nml Nml Nml Nml Nml Nml Nml Nml Nml Nml N/A  Right Mentalis Nml Nml Nml Nml Nml Nml Nml Nml Nml Nml Nml Nml N/A  Right Hyoglossus Nml Nml Nml Nml Nml Nml Nml Nml Nml Nml Nml Nml N/A      Waveforms:

## 2015-07-12 NOTE — Telephone Encounter (Signed)
-----   Message from [place], DO sent at 07/12/2015 12:43 PM EDT ----- If pt done with testing, you can put her in for f/u on Friday morning where I was supposed to have my meeting but that has been cx.

## 2015-07-12 NOTE — Telephone Encounter (Signed)
Left message on machine for patient to call back to schedule a follow up appt.

## 2015-07-12 NOTE — Telephone Encounter (Signed)
Appt made with patient.  

## 2015-07-13 ENCOUNTER — Ambulatory Visit
Admission: RE | Admit: 2015-07-13 | Discharge: 2015-07-13 | Disposition: A | Discharge status: Home / Self Care | Payer: Commercial Managed Care - HMO | Source: Ambulatory Visit

## 2015-07-13 DIAGNOSIS — Z1231 Encounter for screening mammogram for malignant neoplasm of breast: Secondary | ICD-10-CM | POA: Diagnosis not present

## 2015-07-14 ENCOUNTER — Ambulatory Visit: Payer: Commercial Managed Care - HMO

## 2015-07-14 DIAGNOSIS — R278 Other lack of coordination: Secondary | ICD-10-CM | POA: Diagnosis not present

## 2015-07-14 DIAGNOSIS — R471 Dysarthria and anarthria: Secondary | ICD-10-CM

## 2015-07-14 DIAGNOSIS — R1310 Dysphagia, unspecified: Secondary | ICD-10-CM | POA: Diagnosis not present

## 2015-07-14 DIAGNOSIS — R29898 Other symptoms and signs involving the musculoskeletal system: Secondary | ICD-10-CM | POA: Diagnosis not present

## 2015-07-14 DIAGNOSIS — R2689 Other abnormalities of gait and mobility: Secondary | ICD-10-CM | POA: Diagnosis not present

## 2015-07-14 DIAGNOSIS — R29818 Other symptoms and signs involving the nervous system: Secondary | ICD-10-CM | POA: Diagnosis not present

## 2015-07-14 DIAGNOSIS — R4701 Aphasia: Secondary | ICD-10-CM | POA: Diagnosis not present

## 2015-07-14 DIAGNOSIS — R2681 Unsteadiness on feet: Secondary | ICD-10-CM | POA: Diagnosis not present

## 2015-07-14 DIAGNOSIS — R279 Unspecified lack of coordination: Secondary | ICD-10-CM | POA: Diagnosis not present

## 2015-07-14 NOTE — Therapy (Signed)
[facility] [address] [place], [name], [address] Phone: [phone]   Fax:  [phone]  Speech Language Pathology Treatment  Patient Details  Name: Tiffany [name] MRN: [id number] Date of Birth: Jun 15, 1932 Referring Provider: Bluford Kaufmann, M.D.  Encounter Date: 07/14/2015      End of Session - 07/14/15 1443    Visit Number 3   Number of Visits 16   Date for SLP Re-Evaluation 08/12/15   SLP Start Time [id number]   SLP Stop Time  [id number]   SLP Time Calculation (min) 41 min      Past Medical History  Diagnosis Date  . ALLERGIC RHINITIS 11/21/2006  . BACK PAIN 12/17/2006  . COLONIC POLYPS, HX OF 11/21/2006  . HYPERTENSION 11/21/2006  . OSTEOPENIA 08/11/2009  . OSTEOPOROSIS 11/21/2006  . PEDAL EDEMA 07/05/2008  . SYSTOLIC MURMUR [id number]    Past Surgical History  Procedure Laterality Date  . Hemorrhoid surgery    . Tonsillectomy    . Breast surgery      bx  . Cataract extraction Bilateral 2012    There were no vitals filed for this visit.             ADULT SLP TREATMENT - 07/14/15 1408    General Information   Behavior/Cognition Alert;Cooperative;Pleasant mood   Treatment Provided   Treatment provided Cognitive-Linquistic   Pain Assessment   Pain Assessment No/denies pain   Cognitive-Linquistic Treatment   Treatment focused on Dysarthria   Skilled Treatment Pt went through a portion of her HEP for dysarthria, and req'd min-mod cues occasionally for overarticulation. Pt stated she had practiced once/day -SLP reiterated x2/day. SLP guided pt through structured tasks to practice/habitualize overarticulation with cues to open mouth/overpronounce/overarticulate. Pt stated she was doing better with swallowing precautions (chin tuck with liquids).   Assessment / Recommendations / Plan   Plan Continue with current plan of care   Progression Toward Goals   Progression toward goals Progressing toward  goals            SLP Short Term Goals - 07/14/15 1654    SLP SHORT TERM GOAL #1   Title pt will demo compensations for speech intelligibility in 10 minutes simple conversation for intelligiblity >95%   Time 4   Period Weeks   Status On-going   SLP SHORT TERM GOAL #2   Title pt will complete HEP for dysarthria with modified independence   Time 4   Period Weeks   Status On-going   SLP SHORT TERM GOAL #3   Title pt will demo functional verbal expression, using compensations, in 10 minutes simple conversation   Time 4   Period Weeks   Status On-going   SLP SHORT TERM GOAL #4   Title pt will demo appropriate tuck posture for chin down with thin liquids during sessions 95% of the time with rare min A   Time 4   Period Weeks   Status On-going          SLP Long Term Goals - 07/14/15 1655    SLP LONG TERM GOAL #1   Title pt will complete HEP for dysphagia with modified independence   Time 8   Period Weeks   Status On-going   SLP LONG TERM GOAL #2   Title pt will demo compensations for speech intelligibility in 10 minutes mod complex conversation for intelligiblity >95%   Time 8   Period Weeks   Status On-going   SLP LONG TERM GOAL #  3   Title pt will demo functional verbal expression, using compensations, in 10 minutes mod complex conversation   Time 10   Period Weeks   Status On-going   SLP LONG TERM GOAL #4   Title pt will demo compensations for dysphagia in order to decr risk of aspiration 90% of the time with POs during 3 sessions   Time 10   Period Weeks   Status New          Plan - 07/14/15 1653    Clinical Impression Statement Mod dysarthria cont, along with mod pharyngeal dysphagia. See MBSS report under 'imaging" for details. Pt req'd min-mod cues for using overarticulation as compensations for dysarthria using HEP ("tongue twisters" HEP), and  during structured sentence tasks as well.Marland Kitchen   Speech Therapy Frequency 2x / week   Duration --  10 weeks    Treatment/Interventions Aspiration precaution training;Pharyngeal strengthening exercises;Diet toleration management by SLP;SLP instruction and feedback;Compensatory strategies;Internal/external aids;Patient/family education;Functional tasks;Cueing hierarchy   Potential to Achieve Goals Fair   Potential Considerations Severity of impairments   Consulted and Agree with Plan of Care Patient      Patient will benefit from skilled therapeutic intervention in order to improve the following deficits and impairments:   Dysarthria and anarthria  Dysphagia  Aphasia    Problem List Patient Active Problem List   Diagnosis Date Noted  . Aortic stenosis 06/09/2015  . Dysphasia 06/09/2015  . Aortic valve disorder 05/24/2014  . OSTEOPENIA 08/11/2009  . PEDAL EDEMA 07/05/2008  . BACK PAIN 12/17/2006  . Essential hypertension 11/21/2006  . Allergic rhinitis 11/21/2006  . Osteoporosis 11/21/2006  . History of colonic polyps 11/21/2006    [facility] ,MS, CCC-SLP  07/14/2015, 4:55 PM  [facility] [address] [place], [name], [address] Phone: [phone]   Fax:  [phone]   Name: Tiffany [name] MRN: [id number] Date of Birth: 10/24/1932

## 2015-07-15 ENCOUNTER — Encounter: Payer: Self-pay | Admitting: Neurology

## 2015-07-15 ENCOUNTER — Ambulatory Visit (INDEPENDENT_AMBULATORY_CARE_PROVIDER_SITE_OTHER): Payer: Commercial Managed Care - HMO | Admitting: Neurology

## 2015-07-15 VITALS — BP 148/80 | HR 96 | Ht 64.0 in | Wt 156.0 lb

## 2015-07-15 DIAGNOSIS — G231 Progressive supranuclear ophthalmoplegia [Steele-Richardson-Olszewski]: Secondary | ICD-10-CM

## 2015-07-15 DIAGNOSIS — R1314 Dysphagia, pharyngoesophageal phase: Secondary | ICD-10-CM | POA: Diagnosis not present

## 2015-07-15 MED ORDER — CARBIDOPA-LEVODOPA 25-100 MG PO TABS: 1.0000 | ORAL_TABLET | Freq: Three times a day (TID) | ORAL | Status: DC

## 2015-07-15 NOTE — Patient Instructions (Signed)
1. Start Carbidopa Levodopa as follows: 1/2 tab three times a day before meals x 1 wk, then 1/2 in am & noon & 1 in evening for a week, then 1/2 in am &1 at noon &one in evening for a week, then 1 tablet three times a day before meals. 2. We will call you with an appt for neuro cognitive testing.

## 2015-07-15 NOTE — Progress Notes (Signed)
Tiffany [name] was seen today in the movement disorders clinic for neurologic consultation at the request of Nyoka Cowden, MD.  The patient is seen today in neurologic consultation for the subacute evaluation of gait and speech changes for the last several months.  Looking back, she thinks that her handwriting began to change after Thanksgiving some time.  She remembers it being difficult to write her cards at thanksgiving, as the handwriting was smaller and less fluid.  She thinks that she began to have difficulty with word finding about 2 months ago.  She presented to the emergency room at [facility] in [place] on February 27 and I reviewed those records.  She had an MRI of the brain there and I do not have the films, but I do have the report.  It was nonacute and only revealed mild white matter changes.  She was told to follow up with ENT.  She did not have an ENT consult but she did follow up with her primary care physician on 06/03/2015 and was set up with a speech therapy evaluation, cardiology follow-up and was referred here.  She had a carotid ultrasound on 06/06/2015 which just demonstrated 1-39% stenosis.  I reviewed her cardiology follow-up from 06/09/2015 and they did not think that her new symptoms were related to her aortic stenosis.  Her repeat echocardiogram is pending.  I did get a note from her speech therapist and reviewed their records as well.  It was felt that she had both dysphasia as well as dysphagia and a modified barium swallow as well as traditional speech, occupational and physical therapy are being recommended.  Denies muscles jumping.  Is having trouble swallowing.  Only new medication is Lipitor and she started that a few weeks ago.    07/15/15 update:  The patient follows up today after having multiple tests performed.  She had an MRI of the cervical spine on April, 2017 that demonstrated degenerative changes and central canal stenosis and marked bilateral neural  foraminal stenosis at the C4-C5 level and C5-C6 level and significant left neural foraminal stenosis at the C3-C4 level.  She had an EMG performed that was normal, without evidence of motor neuron disease.  She had a modified barium swallow done on 06/29/2015.  There was moderate pharyngeal phase dysphagia.  Regular diet with thin liquids was recommended.  A chin tuck maneuver was helpful.  Home meds with pure was also recommended.  She had a dat scan done on 07/05/2015 with abnormal significant bilateral reduction and putamen uptake with activity largely confined to the caudate nuclei bilaterally.   ALLERGIES:   Allergies  Allergen Reactions  . Codeine Phosphate     CURRENT MEDICATIONS:  Outpatient Encounter Prescriptions as of 07/15/2015  Medication Sig  . amLODipine (NORVASC) 2.5 MG tablet Take 1 tablet (2.5 mg total) by mouth daily.  Marland Kitchen aspirin 81 MG tablet Take 81 mg by mouth daily.    Marland Kitchen atorvastatin (LIPITOR) 10 MG tablet Take 1 tablet (10 mg total) by mouth daily.  . benazepril (LOTENSIN) 40 MG tablet TAKE 1 TABLET EVERY DAY  . Calcium Carbonate (CALTRATE 600) 1500 MG TABS Take by mouth 2 (two) times daily.    . Cholecalciferol (VITAMIN D) 1000 UNITS capsule Take 1,000 Units by mouth daily.    . hydrochlorothiazide (HYDRODIURIL) 25 MG tablet TAKE 1 TABLET EVERY DAY  . Multiple Vitamin (MULTIVITAMIN) tablet Take 1 tablet by mouth daily.    . Omega-3 Fatty Acids (FISH OIL) 1200  MG CAPS Take 1 capsule by mouth daily.  . carbidopa-levodopa (SINEMET IR) 25-100 MG tablet Take 1 tablet by mouth 3 (three) times daily.   No facility-administered encounter medications on file as of 07/15/2015.    PAST MEDICAL HISTORY:   Past Medical History  Diagnosis Date  . ALLERGIC RHINITIS 11/21/2006  . BACK PAIN 12/17/2006  . COLONIC POLYPS, HX OF 11/21/2006  . HYPERTENSION 11/21/2006  . OSTEOPENIA 08/11/2009  . OSTEOPOROSIS 11/21/2006  . PEDAL EDEMA 07/05/2008  . SYSTOLIC MURMUR [id number]    PAST  SURGICAL HISTORY:   Past Surgical History  Procedure Laterality Date  . Hemorrhoid surgery    . Tonsillectomy    . Breast surgery      bx  . Cataract extraction Bilateral 2012    SOCIAL HISTORY:   Social History   Social History  . Marital Status: Single    Spouse Name: N/A  . Number of Children: N/A  . Years of Education: N/A   Occupational History  . retired     Press photographer   Social History Main Topics  . Smoking status: Former Smoker    Quit date: 03/27/1995  . Smokeless tobacco: Never Used  . Alcohol Use: No  . Drug Use: No  . Sexual Activity: Not on file   Other Topics Concern  . Not on file   Social History Narrative    FAMILY HISTORY:   Family Status  Relation Status Death Age  . Mother Deceased 7    ovarian ca  . Father Deceased 65    cad  . Sister Alive     3, one with breast CA  . Sister Deceased     82, pancreatic CA, brain tumor  . Brother Alive     1, CAD  . Brother Deceased     12  . Child Alive     2 sons, alive and well    ROS:  A complete 10 system review of systems was obtained and was unremarkable apart from what is mentioned above.  PHYSICAL EXAMINATION:    VITALS:   Filed Vitals:   07/15/15 0927  BP: 148/80  Pulse: 96  Height: 5\' 4"  (1.626 m)  Weight: 156 lb (70.761 kg)    GEN:  The patient appears stated age and is in NAD. HEENT:  Normocephalic, atraumatic.  The mucous membranes are moist. The superficial temporal arteries are without ropiness or tenderness. CV:  RRR with 3/6 SEM that radiates to the right carotid. Lungs:  CTAB but she has some laryngeal stridor Neck/HEME:  There are no carotid bruits bilaterally.  Neurological examination:  Orientation: The patient is alert and oriented x3.  Cranial nerves: There is good facial symmetry. She has facial hypomimia.  Pupils are equal round and reactive to light bilaterally. Fundoscopic exam reveals clear margins bilaterally. Extraocular muscles are intact but she does have  some trouble with both upgaze and downgaze. She has significant square wave jerks.  The visual fields are full to confrontational testing. The speech is nonfluent but it is fairly clear. She has intermittent grunting.  The soft palate rises symmetrically and there is no tongue deviation. Hearing is intact to conversational tone. Sensation: Sensation is intact to light touch throughout Motor: Strength is 5/5 in the bilateral upper and lower extremities.   Tongue strength is good.  Shoulder shrug is equal and symmetric.  There is no pronator drift.  No fasciculations noted anywhere, including the tongue.   Movement examination: Tone: There is  no rigidity Abnormal movements: None Coordination:  There is  decremation with RAM's, with any form of RAMS, including alternating supination and pronation of the forearm, hand opening and closing, finger taps, heel taps and toe taps bilaterally, L more than right Gait and Station: The patient has mild difficulty arising out of a deep-seated chair without the use of the hands and actually falls back into the chair when she tries and then gets up, starts to walk and nearly falls again. The patient's stride length is normal with slight camptocormia to the right.  She is ataxic.     Labs:    Chemistry      Component Value Date/Time   NA 140 06/13/2015 1404   K 4.3 06/13/2015 1404   CL 99 06/13/2015 1404   CO2 30 06/13/2015 1404   BUN 16 06/13/2015 1404   CREATININE 1.10* 06/13/2015 1404   CREATININE 1.08 12/03/2014 0856      Component Value Date/Time   CALCIUM 10.2 06/13/2015 1404   CALCIUM 10.2 06/13/2015 1404   ALKPHOS 63 06/13/2015 1404   AST 18 06/13/2015 1404   ALT 15 06/13/2015 1404   BILITOT 0.6 06/13/2015 1404     Lab Results  Component Value Date   TSH 1.35 06/13/2015      ASSESSMENT/PLAN:  1.  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  - She had a modified barium swallow done on 06/29/2015.  There was moderate pharyngeal phase dysphagia.  Regular diet with thin liquids was recommended.  A chin tuck maneuver was helpful.  Home meds with pure was also recommended.    - We talked about nature, etiology and pathophysiology. We talked about how the symptoms, course and prognosis differ from Parkinson's Disease.  We talked about the risks, particularly for falls and aspiration.  Talked her about level of care orders.  She states that she has a healthcare power of attorney but needs to modified level of care orders.  -She is starting in speech therapy and will start a physical therapy evaluation on Tuesday  -We will initiate carbidopa/levodopa 25/100 and slowly work up to 1 tablet three times a day.  This likely won't be enough medication but this is what we will start with for now and we will see how she does.  -I think she does have some cognitive impairment and we will schedule her for neuropsych testing.  -I encouraged her to bring family next visit.  Her two sons live here  -I talked to her about the cure PSP organization and their website.  Patient education was provided.  -She apparently just had an eye examination in February.  -Talked to her about the fact that we have hired a movement disorder Education officer, museum, but she will not be with [place] and tell July.  I think she will be a great resource to this patient. 2.  Follow up in June, sooner should new neurologic issues arise.  Much greater than 50% of this 45 minute visit was spent in counseling with the patient.

## 2015-07-19 ENCOUNTER — Ambulatory Visit: Payer: Commercial Managed Care - HMO | Admitting: Physical Therapy

## 2015-07-19 ENCOUNTER — Ambulatory Visit: Payer: Commercial Managed Care - HMO | Admitting: Occupational Therapy

## 2015-07-19 DIAGNOSIS — R1310 Dysphagia, unspecified: Secondary | ICD-10-CM | POA: Diagnosis not present

## 2015-07-19 DIAGNOSIS — R4701 Aphasia: Secondary | ICD-10-CM | POA: Diagnosis not present

## 2015-07-19 DIAGNOSIS — R279 Unspecified lack of coordination: Secondary | ICD-10-CM | POA: Diagnosis not present

## 2015-07-19 DIAGNOSIS — R29818 Other symptoms and signs involving the nervous system: Secondary | ICD-10-CM

## 2015-07-19 DIAGNOSIS — R29898 Other symptoms and signs involving the musculoskeletal system: Secondary | ICD-10-CM | POA: Diagnosis not present

## 2015-07-19 DIAGNOSIS — R278 Other lack of coordination: Secondary | ICD-10-CM | POA: Diagnosis not present

## 2015-07-19 DIAGNOSIS — R2689 Other abnormalities of gait and mobility: Secondary | ICD-10-CM | POA: Diagnosis not present

## 2015-07-19 DIAGNOSIS — R2681 Unsteadiness on feet: Secondary | ICD-10-CM | POA: Diagnosis not present

## 2015-07-19 DIAGNOSIS — R471 Dysarthria and anarthria: Secondary | ICD-10-CM | POA: Diagnosis not present

## 2015-07-19 NOTE — Therapy (Signed)
[facility] [address] [place], [name], [address] Phone: [phone]   Fax:  [phone]  Occupational Therapy Evaluation  Patient Details  Name: Tiffany [name] MRN: [id number] Date of Birth: [birth year] Referring Provider: Dr. Carles Collet  Encounter Date: 07/19/2015      OT End of Session - 07/19/15 1610    Visit Number 1   Number of Visits 17   Date for OT Re-Evaluation 09/17/15   Authorization Type [employer]   Authorization - Visit Number 1   Authorization - Number of Visits 10   OT Start Time [id number]   OT Stop Time 1530   OT Time Calculation (min) 35 min   Activity Tolerance Patient tolerated treatment well   Behavior During Therapy [facility] for tasks assessed/performed      Past Medical History  Diagnosis Date  . ALLERGIC RHINITIS 11/21/2006  . BACK PAIN 12/17/2006  . COLONIC POLYPS, HX OF 11/21/2006  . HYPERTENSION 11/21/2006  . OSTEOPENIA 08/11/2009  . OSTEOPOROSIS 11/21/2006  . PEDAL EDEMA 07/05/2008  . SYSTOLIC MURMUR [id number]    Past Surgical History  Procedure Laterality Date  . Hemorrhoid surgery    . Tonsillectomy    . Breast surgery      bx  . Cataract extraction Bilateral 2012    There were no vitals filed for this visit.      Subjective Assessment - 07/19/15 1704    Subjective  Pt with speech and handwriting changes that started 3 mons ago has been newly dx with PSP   Pertinent History see Epic   Patient Stated Goals hanwriting, increased ease with fasteners   Currently in Pain? Yes   Pain Score 2    Pain Location --  thumb   Pain Orientation Right   Pain Descriptors / Indicators Aching   Pain Type Acute pain   Pain Onset 1 to 4 weeks ago   Pain Frequency Intermittent   Aggravating Factors  movement   Pain Relieving Factors inactivity   Multiple Pain Sites No           [facility] OT Assessment - 07/19/15 0001    Assessment   Diagnosis PSP   Referring Provider Dr. Carles Collet   Onset  Date 07/15/15  diagnosed with probable PSP,  symptoms x 3 mons    Assessment Pt noticed changes with speech and handwriting approximately 3 mons ago. she was just diagnosed by Dr. Carles Collet with PSP on Friday.   Precautions   Precautions Fall   Balance Screen   Has the patient fallen in the past 6 months Yes   How many times? 1   Has the patient had a decrease in activity level because of a fear of falling?  No   Is the patient reluctant to leave their home because of a fear of falling?  No   Home  Environment   Family/patient expects to be discharged to: Private residence   [place] One level   Lives With Alone   Prior Function   Level of Independence Independent   ADL   Eating/Feeding Modified independent   Grooming Independent   Upper Body Bathing Modified independent  increased time   Lower Body Bathing Modified independent  increased time   Upper Body Dressing Increased time;Needs assist for fasteners   Lower Body Dressing Needs assist for fasteners;Increased time   Tax adviser --  walk in shower   ADL comments  Pt requires increased time   Mobility   Mobility Status Independent   Written Expression   Dominant Hand Right   Handwriting 90% legible;Mild micrographia   Vision - History   Baseline Vision Wears glasses only for reading   Patient Visual Report --  Blurry vision , difficulty tracking inferiorly   Vision Assessment   Vision Assessment Vision impaired  _ to be further tested in functional context   Tracking/Visual Pursuits Other (comment)  difficulty tracking inferiorly   Cognition   Overall Cognitive Status Cognition to be further assessed in functional context PRN  increased time to process directions   Behaviors Impulsive   Observation/Other Assessments   Standing Functional Reach Test RUE 9 ", LUE 10 "   Coordination   Fine Motor Movements are Fluid and Coordinated No   9 Hole Peg Test Right;Left    Right 9 Hole Peg Test 51.69 secs   Left 9 Hole Peg Test 29.53 sec   Box and Blocks R 38 blocks, L 43 blocks   Coordination decreased coordination RUE   Tone   Assessment Location --  per MD note mild rigidity in RUE   ROM / Strength   AROM / PROM / Strength AROM   AROM   Overall AROM  Within functional limits for tasks performed                           OT Short Term Goals - 07/19/15 1628    OT SHORT TERM GOAL #1   Title I with HEP. due 08/17/15   Time 4   Period Weeks   Status New   OT SHORT TERM GOAL #2   Title Pt will demonstrate improved fine motor coordination as evidednce by decreasing 9 hole peg test score to 45 secs or less.   Time 4   Period Weeks   Status New   OT SHORT TERM GOAL #3   Title Pt will verbalize understanding of adapted strategies for ADLs.   Time 4   Period Weeks   Status New   OT SHORT TERM GOAL #4   Title Pt will verbalize understanding of compensatory strategies for visual changes.   Time 4   Period Weeks   Status New   OT SHORT TERM GOAL #5   Title Pt will write a short paragraph with 100 % legibility and minimal decrease in letter size.   Time 4   Period Weeks   Status New           OT Long Term Goals - 07/19/15 1630    OT LONG TERM GOAL #1   Title Pt will demonstrate improved fine motor coordination as evidenced by decreasing RUE 9 hole peg test score to 40 secs or less.- due 09/16/15   Baseline RUE 51.69, LUE 29.53 secs   Time 8   Period Weeks   Status New   OT LONG TERM GOAL #2   Title Pt will improve RUE standing functional reach to 10 " or greater for improved standing balance.   Baseline RUE 9", LUE 10"   Time 8   Period Weeks   Status New   OT LONG TERM GOAL #3   Title Pt will improve RUE box/ blocks score to 41 blocks.   Baseline baseline: RUE 38 blocks, LUE 43 blocks   Time 8   Period Weeks   Status New   OT LONG TERM GOAL #4   Title Pt  will demonstrate improved ease with fasteners as  evidenced  by performing 3 button/ unbutton in 26 secs or less.   Baseline 29.22 secs   Time 8   Period Weeks   Status New   OT LONG TERM GOAL #5   Title Pt will verbalize understanding of strategies to minimize freezing episodes   Time 8   Period Weeks   Status New               Plan - 07/19/15 1625    Clinical Impression Statement Pt newly diagnosed with probable PSP presents with decreased coordination, bradykinesia, decreased balance, rigdity, visuospatial changes which impede performance of ADLs/ IADLS. Pt can benefit from skilled occupational therapy to maximize safety, independence with ADLs/ IADLS and quality of life.   Rehab Potential Good   OT Frequency 2x / week  plus eval   OT Duration 8 weeks   OT Treatment/Interventions Self-care/ADL training;Therapeutic exercise;Patient/family education;Balance training;Splinting;Manual Therapy;Neuromuscular education;Therapeutic exercises;Therapeutic activities;Fluidtherapy;Cognitive remediation/compensation;Visual/perceptual remediation/compensation;Passive range of motion;Moist Heat;Contrast Bath;DME and/or AE instruction;Parrafin;Cryotherapy;Energy conservation;Ultrasound   Plan initiate HEP for RUE coordination   Consulted and Agree with Plan of Care Patient      Patient will benefit from skilled therapeutic intervention in order to improve the following deficits and impairments:  Decreased coordination, Decreased range of motion, Decreased safety awareness, Decreased endurance, Decreased activity tolerance, Impaired tone, Impaired UE functional use, Pain, Decreased knowledge of use of DME, Decreased balance, Decreased cognition, Decreased mobility, Decreased strength, Impaired vision/preception  Visit Diagnosis: Other lack of coordination - Plan: Ot plan of care cert/re-cert  Other abnormalities of gait and mobility - Plan: Ot plan of care cert/re-cert  Other symptoms and signs involving the nervous system - Plan: Ot plan  of care cert/re-cert  Other symptoms and signs involving the musculoskeletal system - Plan: Ot plan of care cert/re-cert      G-Codes - 0000000 [id number]    Functional Assessment Tool Used 9 hole peg test RUE 51.69 secs, LUE29.53 secs, standing functional reach RUE 9", LUE 10", 3 button/ unbutton 29.22 secs   Functional Limitation Self care   Self Care Current Status [id number]) At least 20 percent but less than 40 percent impaired, limited or restricted   Self Care Goal Status [id number]) At least 1 percent but less than 20 percent impaired, limited or restricted      Problem List Patient Active Problem List   Diagnosis Date Noted  . PSP (progressive supranuclear palsy) ([place]) 07/15/2015  . Aortic stenosis 06/09/2015  . Dysphasia 06/09/2015  . Aortic valve disorder 05/24/2014  . OSTEOPENIA 08/11/2009  . PEDAL EDEMA 07/05/2008  . BACK PAIN 12/17/2006  . Essential hypertension 11/21/2006  . Allergic rhinitis 11/21/2006  . Osteoporosis 11/21/2006  . History of colonic polyps 11/21/2006    RINE,KATHRYN 07/19/2015, 5:08 PM [name], OTR/L [phone] Phone: [phone] 5:08 PM 07/19/2015 [facility] [address] [place], [name], [address] Phone: [phone]   Fax:  [phone]  Name: Tiffany [name] MRN: [id number] Date of Birth: [birth year]

## 2015-07-20 NOTE — Therapy (Signed)
[facility] [address] [place], [name], [address] Phone: [phone]   Fax:  [phone]  Physical Therapy Evaluation  Patient Details  Name: Tiffany [name] MRN: [id number] Date of Birth: 02-24-1942 Referring Provider: Alonza Bogus, DO  Encounter Date: 07/19/2015      PT End of Session - 07/20/15 1614    Visit Number 1   Number of Visits 17  eval + 16 visits   Date for PT Re-Evaluation 09/17/15   Authorization Type [employer]   Authorization Time Period G Codes and progress note required every 10 visits   PT Start Time 1532   PT Stop Time 1615   PT Time Calculation (min) 43 min   Equipment Utilized During Treatment Gait belt   Activity Tolerance Patient tolerated treatment well   Behavior During Therapy WFL for tasks assessed/performed      Past Medical History  Diagnosis Date  . ALLERGIC RHINITIS 11/21/2006  . BACK PAIN 12/17/2006  . COLONIC POLYPS, HX OF 11/21/2006  . HYPERTENSION 11/21/2006  . OSTEOPENIA 08/11/2009  . OSTEOPOROSIS 11/21/2006  . PEDAL EDEMA 07/05/2008  . SYSTOLIC MURMUR [id number]    Past Surgical History  Procedure Laterality Date  . Hemorrhoid surgery    . Tonsillectomy    . Breast surgery      bx  . Cataract extraction Bilateral 2012    There were no vitals filed for this visit.       Subjective Assessment - 07/19/15 1537    Subjective "My balance is off. I fetget overbalanced." Pt reports she has fallen, stating, "The last time was 3 weeks ago. I missed the second step and I scraped my face." Pt has fallen 2-3 times in the past 6 months.    Pertinent History Likes to be called "Tiffany [name]".  PMH significant for: progressive supranuclear palsy (diagnosed 07/15/15), HTN, osteopenia, aortic stenosis, bilateral cataract extraction   Patient Stated Goals "My goal is to walk better."   Currently in Pain? No/denies            [facility] PT Assessment - 07/20/15 0001    Assessment    Medical Diagnosis Hereditary and idiopathic peripheral neuropathy; hyperreflexia   Referring Provider Wells Guiles Tat, DO   Onset Date/Surgical Date 07/15/15   Precautions   Precautions Fall   Restrictions   Weight Bearing Restrictions No   Balance Screen   Has the patient fallen in the past 6 months Yes   How many times? 2-3   Has the patient had a decrease in activity level because of a fear of falling?  Yes   Is the patient reluctant to leave their home because of a fear of falling?  Yes   [place] residence   Living Arrangements Alone   Type of [place] to enter   Entrance Stairs-Number of Steps 2.5   Entrance Stairs-Rails None   Home Layout One level   Home Equipment None   Additional Comments Pt recently sustained fall on stairs.   Prior Function   Level of Independence Independent   Vocation Retired   Leisure Likes to go to [employer]   Overall Cognitive Status --  Pt does not perceive cognitive changes   Behaviors Impulsive   Sensation   Light Touch Appears Intact  BLE's   Coordination   Gross Motor Movements are Fluid and Coordinated No   Coordination and Movement Description Impaired rapid  alternating movement in L > R ankle.   Heel Shin Test Impaired grading of movement in LLE; grossly WFL in RLE.   ROM / Strength   AROM / PROM / Strength AROM;Strength   AROM   Overall AROM  Within functional limits for tasks performed   Overall AROM Comments WFL with exception of L hip flexion limited by pain in L hip (pt attributed to longstanding h/o low back pain which radiates into L hip)   Strength   Overall Strength Within functional limits for tasks performed   Overall Strength Comments WFL in BLE's, with exception of L hip flexion, which was NT due to pain with AROM.   Transfers   Transfers Sit to Stand;Stand to Sit   Sit to Stand 5: Supervision   Sit to Stand Details (indicate cue type and reason) Pt  requires close (S) for sit > stand without UE support due to pt perception of anterior LOB (although pt appears to have posterior preference).   Five time sit to stand comments  13.74   Ambulation/Gait   Ambulation/Gait Yes   Ambulation/Gait Assistance 5: Supervision;4: Min guard   Ambulation/Gait Assistance Details Supervision for linear gait; min guard for turning. Required mod A to recover from LOB during tandem gait (see FGA for details).    Ambulation Distance (Feet) 350 Feet   Assistive device None   Gait Pattern Step-through pattern;Decreased arm swing - left;Scissoring;Wide base of support  LLE scissoring x1 episode   Ambulation Surface Level;Indoor   Gait velocity 3.25 ft/sec   Gait velocity - backwards 1.84 ft/sec   Stairs Yes   Stairs Assistance 5: Supervision   Stairs Assistance Details (indicate cue type and reason) Ascended 4 steps without rails with reciprocal pattern requiring close (S) due to posterior lean. Educated pt on step-to pattern for stair negotiation without rails (to simulatr home entrance) with effective return demo. Will require reinforcement.   Stair Management Technique No rails;Alternating pattern;Step to pattern;Forwards   Number of Stairs 8  4 stairs x2 trials   Height of Stairs 6   Gait Comments Noted BUE's in high guard position during dynamic gait activities   Standardized Balance Assessment   Standardized Balance Assessment Timed Up and Go Test   Timed Up and Go Test   TUG Normal TUG   Normal TUG (seconds) 11.1   Functional Gait  Assessment   Gait assessed  Yes   Gait Level Surface Walks 20 ft in less than 7 sec but greater than 5.5 sec, uses assistive device, slower speed, mild gait deviations, or deviates 6-10 in outside of the 12 in walkway width.   Change in Gait Speed Able to change speed, demonstrates mild gait deviations, deviates 6-10 in outside of the 12 in walkway width, or no gait deviations, unable to achieve a major change in velocity,  or uses a change in velocity, or uses an assistive device.   Gait with Horizontal Head Turns Performs head turns with moderate changes in gait velocity, slows down, deviates 10-15 in outside 12 in walkway width but recovers, can continue to walk.  standing rest break x15 seconds due to dizziness   Gait with Vertical Head Turns Performs task with moderate change in gait velocity, slows down, deviates 10-15 in outside 12 in walkway width but recovers, can continue to walk.   Gait and Pivot Turn Pivot turns safely in greater than 3 sec and stops with no loss of balance, or pivot turns safely within 3 sec and  stops with mild imbalance, requires small steps to catch balance.   Step Over Obstacle Is able to step over one shoe box (4.5 in total height) but must slow down and adjust steps to clear box safely. May require verbal cueing.   Gait with Narrow Base of Support Ambulates less than 4 steps heel to toe or cannot perform without assistance.  significant LOB requiring mod A to recover   Gait with Eyes Closed Walks 20 ft, uses assistive device, slower speed, mild gait deviations, deviates 6-10 in outside 12 in walkway width. Ambulates 20 ft in less than 9 sec but greater than 7 sec.  7.57 sec   Ambulating Backwards Walks 20 ft, slow speed, abnormal gait pattern, evidence for imbalance, deviates 10-15 in outside 12 in walkway width.  10.68 sec   Steps Alternating feet, must use rail.   Total Score 13   FGA comment: < 19 indicates high fall risk                            PT Education - 07/20/15 1552    Education provided Yes   Education Details PT eval findings, goals, and POC. Educated on gaze fixation during sit > stand to decrease disequilibrium with transitional movement.   Person(s) Educated Patient   Methods Explanation;Demonstration   Comprehension Verbalized understanding;Returned demonstration          PT Short Term Goals - 07/20/15 1644    PT SHORT TERM GOAL #1    Title Pt will perform initial HEP independently to maximize functional gains made in PT.   (Target date: 08/16/15)   PT SHORT TERM GOAL #2   Title Determine appropriate assistive device and request MD order, if necessary, to decrease fall risk.   PT SHORT TERM GOAL #3   Title Pt will negotiate 3 stairs without rails with step-to pattern and mod I using LRAD to indicate pt safety using primary home entrance.   PT SHORT TERM GOAL #4   Title Pt will ambulate >300' over level, indoor surfaces with mod I using LRAD to indicate increased safety with household ambulation.    PT SHORT TERM GOAL #5   Title Pt will negotiate standard ramp and curb step with supervision using LRAD to indicate increased safety traversing commnuity obstacles.    PT SHORT TERM GOAL #6   Title Pt will improve FGA score from 13/30 to >/= 16/30 to indicate decreased fall risk.           PT Long Term Goals - 07/20/15 1655    PT LONG TERM GOAL #1   Title Pt will independently perform HEP to indicate safe daily compliance with HEP.   (Target date: 09/13/15)   PT LONG TERM GOAL #2   Title Pt will improve FGA score from 13/30 to 19/30 to indicate significant improvement in dynamic gait stability.    PT LONG TERM GOAL #3   Title Pt will ambulate >500'' over unlevel, paved surfaces with mod I using LRAD to indicate increased safety with commnuity ambulation.    PT LONG TERM GOAL #4   Title Pt will negotiate standard ramp and curb step with mod I using LRAD to indicate increased independence traversing commnuity obstacles.    PT LONG TERM GOAL #5   Title Pt will verbalize plan for community exercise following episode of PT to indicate safe transition to community fitness regimen.  Plan - 07/20/15 1618    Clinical Impression Statement Pt is an 82 y/o F referred to outpatient PT to address functional impairments associated with progressive supranuclear palsy (diagnosed 07/15/15). PMH significant for: HTN,  osteopenia, aortic stenosis, bilateral cataract extraction. PT evaluation reveals the following impairments: gait impairments; decreased dynamic gait stability; postural instability (posterior preference); rigidity in trunk; FGA score indicative of high fall risk; impaired LE coordination; impaired grading of movement in LLE; decreased stability/independence with transfers, ambulation, and stair negotiation; dizziness with functional head turns; and frequent falls. Pt will benefit from skilled outpatient PT 2x/week for 8 weeks to address said impairments.     Rehab Potential Good   PT Frequency 2x / week   PT Duration 8 weeks   PT Treatment/Interventions ADLs/Self Care Home Management;DME Instruction;Gait training;Stair training;Neuromuscular re-education;Functional mobility training;Therapeutic activities;Therapeutic exercise;Balance training;Patient/family education   PT Next Visit Plan Trial gait with rollator. Discuss safety with turning (avoiding pivoting on one foot; keeping feet apart to avoid scissoring). HEP: initiate seated PWR! exercises.   Consulted and Agree with Plan of Care Patient      Patient will benefit from skilled therapeutic intervention in order to improve the following deficits and impairments:  Abnormal gait, Decreased balance, Decreased coordination, Postural dysfunction, Other (comment), Dizziness, Decreased knowledge of use of DME, Decreased safety awareness  Visit Diagnosis: Other abnormalities of gait and mobility - Plan: PT plan of care cert/re-cert  Unspecified lack of coordination - Plan: PT plan of care cert/re-cert  Unsteadiness on feet - Plan: PT plan of care cert/re-cert      G-Codes - 0000000 [id number]    Functional Assessment Tool Used FGA 13/30   Functional Limitation Mobility: Walking and moving around   Mobility: Walking and Moving Around Current Status [phone]) At least 40 percent but less than 60 percent impaired, limited or restricted   Mobility:  Walking and Moving Around Goal Status [phone]) At least 20 percent but less than 40 percent impaired, limited or restricted       Problem List Patient Active Problem List   Diagnosis Date Noted  . PSP (progressive supranuclear palsy) ([place]) 07/15/2015  . Aortic stenosis 06/09/2015  . Dysphasia 06/09/2015  . Aortic valve disorder 05/24/2014  . OSTEOPENIA 08/11/2009  . PEDAL EDEMA 07/05/2008  . BACK PAIN 12/17/2006  . Essential hypertension 11/21/2006  . Allergic rhinitis 11/21/2006  . Osteoporosis 11/21/2006  . History of colonic polyps 11/21/2006   Billie Ruddy, PT, DPT [facility] [address] [place], [name], [address] Phone: [phone]   Fax:  [phone] 07/20/2015, 5:06 PM  Name: JURNEI BALYEAT MRN: [id number] Date of Birth: [birth year]

## 2015-07-21 ENCOUNTER — Encounter: Payer: Commercial Managed Care - HMO | Admitting: Speech Pathology

## 2015-07-26 ENCOUNTER — Ambulatory Visit: Payer: Commercial Managed Care - HMO | Attending: Internal Medicine | Admitting: Speech Pathology

## 2015-07-26 ENCOUNTER — Ambulatory Visit: Payer: Commercial Managed Care - HMO | Admitting: Occupational Therapy

## 2015-07-26 DIAGNOSIS — R29898 Other symptoms and signs involving the musculoskeletal system: Secondary | ICD-10-CM | POA: Insufficient documentation

## 2015-07-26 DIAGNOSIS — R4701 Aphasia: Secondary | ICD-10-CM

## 2015-07-26 DIAGNOSIS — R29818 Other symptoms and signs involving the nervous system: Secondary | ICD-10-CM | POA: Insufficient documentation

## 2015-07-26 DIAGNOSIS — R279 Unspecified lack of coordination: Secondary | ICD-10-CM | POA: Insufficient documentation

## 2015-07-26 DIAGNOSIS — R278 Other lack of coordination: Secondary | ICD-10-CM | POA: Insufficient documentation

## 2015-07-26 DIAGNOSIS — R2681 Unsteadiness on feet: Secondary | ICD-10-CM | POA: Diagnosis not present

## 2015-07-26 DIAGNOSIS — R471 Dysarthria and anarthria: Secondary | ICD-10-CM

## 2015-07-26 DIAGNOSIS — R2689 Other abnormalities of gait and mobility: Secondary | ICD-10-CM | POA: Insufficient documentation

## 2015-07-26 DIAGNOSIS — R1310 Dysphagia, unspecified: Secondary | ICD-10-CM | POA: Insufficient documentation

## 2015-07-26 NOTE — Patient Instructions (Signed)
Coordination Exercises  Perform the following exercises for 20 minutes 1-2 times per day. Perform with both hand(s). Perform using big movements.   Flipping Cards: Place deck of cards on the table. Flip cards over by opening your hand big to grasp and then turn your palm up big.  Deal cards: Hold 1/2 or whole deck in your hand. Use thumb to push card off top of deck with one big push.  Rotate ball with fingertips: Pick up with fingers/thumb and move as much as you can with each turn/movement (clockwise and counter-clockwise).  Toss ball from one hand to the other: Toss big/high.  Pick up coins and place in coin bank or container: Pick up with big, intentional movements. Do not drag coin to the edge.  Pick up coins and stack one at a time: Pick up with big, intentional movements. Do not drag coin to the edge. (5-10 in a stack)  Pick up 5-10 coins one at a time and hold in palm. Then, move coins from palm to fingertips one at time and place in coin bank/container.  Practice writing: Slow down, write big, and focus on forming each letter.  Perform "Flicks"/hand stretches (PWR! Hands): Close hands then flick out your fingers with focus on opening hands, pulling wrists back, and extending elbows like you are pushing.

## 2015-07-26 NOTE — Therapy (Signed)
[facility] [address] [place], [name], [address] Phone: [phone]   Fax:  [phone]  Speech Language Pathology Treatment  Patient Details  Name: Tiffany [name] MRN: [id number] Date of Birth: April 13, 1932 Referring Provider: Bluford Kaufmann, M.D.  Encounter Date: 07/26/2015      End of Session - 07/26/15 1500    Visit Number 4   Number of Visits 16   Date for SLP Re-Evaluation 08/12/15   Authorization Type humana   SLP Start Time [id number]   SLP Stop Time  [id number]   SLP Time Calculation (min) 41 min      Past Medical History  Diagnosis Date  . ALLERGIC RHINITIS 11/21/2006  . BACK PAIN 12/17/2006  . COLONIC POLYPS, HX OF 11/21/2006  . HYPERTENSION 11/21/2006  . OSTEOPENIA 08/11/2009  . OSTEOPOROSIS 11/21/2006  . PEDAL EDEMA 07/05/2008  . SYSTOLIC MURMUR [id number]    Past Surgical History  Procedure Laterality Date  . Hemorrhoid surgery    . Tonsillectomy    . Breast surgery      bx  . Cataract extraction Bilateral 2012    There were no vitals filed for this visit.      Subjective Assessment - 07/26/15 1418    Subjective "I have trouble thinking of words a lot"   Currently in Pain? No/denies               ADULT SLP TREATMENT - 07/26/15 1420    General Information   Behavior/Cognition Alert;Cooperative;Pleasant mood   Treatment Provided   Treatment provided Cognitive-Linquistic   Pain Assessment   Pain Assessment No/denies pain   Cognitive-Linquistic Treatment   Treatment focused on Dysarthria   Skilled Treatment HEP for dysarthria with min verbal cues and modeling for slow rate and over articulation. Facilitated compensations of describing for aphasia and trained this having pt describe simple animals for ST to guess using slow rate and over articulation.. Pt required  verbal instruction to use most salient details in her descriptions.    Assessment / Recommendations / Plan   Plan Continue  with current plan of care   Progression Toward Goals   Progression toward goals Progressing toward goals          SLP Education - 07/26/15 1451    Education provided Yes   Education Details compensations for aphasia          SLP Short Term Goals - 07/26/15 1500    SLP SHORT TERM GOAL #1   Title pt will demo compensations for speech intelligibility in 10 minutes simple conversation for intelligiblity >95%   Time 3   Period Weeks   Status On-going   SLP SHORT TERM GOAL #2   Title pt will complete HEP for dysarthria with modified independence   Time 3   Period Weeks   Status On-going   SLP SHORT TERM GOAL #3   Title pt will demo functional verbal expression, using compensations, in 10 minutes simple conversation   Time 3   Period Weeks   Status On-going   SLP SHORT TERM GOAL #4   Title pt will demo appropriate tuck posture for chin down with thin liquids during sessions 95% of the time with rare min A   Time 3   Period Weeks   Status On-going          SLP Long Term Goals - 07/26/15 1500    SLP LONG TERM GOAL #1   Title pt will complete HEP for  dysphagia with modified independence   Time 7   Period Weeks   Status On-going   SLP LONG TERM GOAL #2   Title pt will demo compensations for speech intelligibility in 10 minutes mod complex conversation for intelligiblity >95%   Time 7   Period Weeks   Status On-going   SLP LONG TERM GOAL #3   Title pt will demo functional verbal expression, using compensations, in 10 minutes mod complex conversation   Time 9   Period Weeks   Status On-going   SLP LONG TERM GOAL #4   Title pt will demo compensations for dysphagia in order to decr risk of aspiration 90% of the time with POs during 3 sessions   Time 9   Period Weeks   Status New          Plan - 07/26/15 1451    Clinical Impression Statement Pt continues to require min A to carryover compensations for dysarthria. Initiated training in compensations for aphasia.  Continue skilled ST to maximize verbal expression, intelligiblity and safety of swallow.   Speech Therapy Frequency 2x / week   Treatment/Interventions Aspiration precaution training;Pharyngeal strengthening exercises;Diet toleration management by SLP;SLP instruction and feedback;Compensatory strategies;Internal/external aids;Patient/family education;Functional tasks;Cueing hierarchy   Potential to Achieve Goals Fair   Potential Considerations Severity of impairments   Consulted and Agree with Plan of Care Patient      Patient will benefit from skilled therapeutic intervention in order to improve the following deficits and impairments:   Dysarthria and anarthria  Aphasia    Problem List Patient Active Problem List   Diagnosis Date Noted  . PSP (progressive supranuclear palsy) ([place]) 07/15/2015  . Aortic stenosis 06/09/2015  . Dysphasia 06/09/2015  . Aortic valve disorder 05/24/2014  . OSTEOPENIA 08/11/2009  . PEDAL EDEMA 07/05/2008  . BACK PAIN 12/17/2006  . Essential hypertension 11/21/2006  . Allergic rhinitis 11/21/2006  . Osteoporosis 11/21/2006  . History of colonic polyps 11/21/2006    Khoi Hamberger, Annye Rusk MS, CCC-SLP 07/26/2015, 3:01 PM  [facility] [address] [place], [name], [address] Phone: [phone]   Fax:  [phone]   Name: Tiffany [name] MRN: [id number] Date of Birth: [birth year]

## 2015-07-27 ENCOUNTER — Encounter: Payer: Self-pay | Admitting: Physical Therapy

## 2015-07-27 ENCOUNTER — Ambulatory Visit: Payer: Commercial Managed Care - HMO | Admitting: Occupational Therapy

## 2015-07-27 ENCOUNTER — Ambulatory Visit: Payer: Commercial Managed Care - HMO | Admitting: Physical Therapy

## 2015-07-27 DIAGNOSIS — R471 Dysarthria and anarthria: Secondary | ICD-10-CM | POA: Diagnosis not present

## 2015-07-27 DIAGNOSIS — R2689 Other abnormalities of gait and mobility: Secondary | ICD-10-CM | POA: Diagnosis not present

## 2015-07-27 DIAGNOSIS — R29818 Other symptoms and signs involving the nervous system: Secondary | ICD-10-CM

## 2015-07-27 DIAGNOSIS — R2681 Unsteadiness on feet: Secondary | ICD-10-CM

## 2015-07-27 DIAGNOSIS — R29898 Other symptoms and signs involving the musculoskeletal system: Secondary | ICD-10-CM | POA: Diagnosis not present

## 2015-07-27 DIAGNOSIS — R278 Other lack of coordination: Secondary | ICD-10-CM | POA: Diagnosis not present

## 2015-07-27 DIAGNOSIS — R279 Unspecified lack of coordination: Secondary | ICD-10-CM | POA: Diagnosis not present

## 2015-07-27 DIAGNOSIS — R4701 Aphasia: Secondary | ICD-10-CM | POA: Diagnosis not present

## 2015-07-27 DIAGNOSIS — R1310 Dysphagia, unspecified: Secondary | ICD-10-CM | POA: Diagnosis not present

## 2015-07-27 NOTE — Therapy (Signed)
[facility] [address] [place], [name], [address] Phone: [phone]   Fax:  [phone]  Occupational Therapy Treatment  Patient Details  Name: Tiffany [name] MRN: [id number] Date of Birth: 09/29/1932 Referring Provider: Dr. Carles Collet  Encounter Date: 07/27/2015      OT End of Session - 07/27/15 1607    Visit Number 3   Number of Visits 17   Date for OT Re-Evaluation 09/17/15   Authorization Type [employer]   Authorization - Visit Number 3   Authorization - Number of Visits 10   OT Start Time [id number]   OT Stop Time 1620   OT Time Calculation (min) 45 min      Past Medical History  Diagnosis Date  . ALLERGIC RHINITIS 11/21/2006  . BACK PAIN 12/17/2006  . COLONIC POLYPS, HX OF 11/21/2006  . HYPERTENSION 11/21/2006  . OSTEOPENIA 08/11/2009  . OSTEOPOROSIS 11/21/2006  . PEDAL EDEMA 07/05/2008  . SYSTOLIC MURMUR [id number]    Past Surgical History  Procedure Laterality Date  . Hemorrhoid surgery    . Tonsillectomy    . Breast surgery      bx  . Cataract extraction Bilateral 2012    There were no vitals filed for this visit.      Subjective Assessment - 07/27/15 1545    Pertinent History see Epic   Patient Stated Goals handwriting, increased ease with fasteners   Currently in Pain? No/denies            Copying small peg design in seated with RUE for increased fine motor coordination with cognitive component, removing pegs with PWR! Step alternating fingers to remove pegs, min-mod difficulty             PWR [facility]) - 07/27/15 1524                                                PWR!supine   PWR! Up x10   PWR! Rock [employer]! Twist x20   PWR! Step x4   Comments performed in supine, min/ mod v.c. cues for technique and therapist instructed pt in how to use for bed mobility             OT Education - 07/27/15 1629    Education provided Yes   Education Details PWR! basic 4 in supine  and how to use for bed mobility   Person(s) Educated Patient   Methods Demonstration;Tactile cues;Explanation;Handout   Comprehension Verbalized understanding;Returned demonstration;Verbal cues required          OT Short Term Goals - 07/19/15 1628    OT SHORT TERM GOAL #1   Title I with HEP. due 08/17/15   Time 4   Period Weeks   Status New   OT SHORT TERM GOAL #2   Title Pt will demonstrate improved fine motor coordination as evidednce by decreasing 9 hole peg test score to 45 secs or less.   Time 4   Period Weeks   Status New   OT SHORT TERM GOAL #3   Title Pt will verbalize understanding of adapted strategies for ADLs.   Time 4   Period Weeks   Status New   OT SHORT TERM GOAL #4   Title Pt will verbalize understanding of compensatory strategies for visual changes.   Time 4   Period  Weeks   Status New   OT SHORT TERM GOAL #5   Title Pt will write a short paragraph with 100 % legibility and minimal decrease in letter size.   Time 4   Period Weeks   Status New           OT Long Term Goals - 07/19/15 1630    OT LONG TERM GOAL #1   Title Pt will demonstrate improved fine motor coordination as evidenced by decreasing RUE 9 hole peg test score to 40 secs or less.- due 09/16/15   Baseline RUE 51.69, LUE 29.53 secs   Time 8   Period Weeks   Status New   OT LONG TERM GOAL #2   Title Pt will improve RUE standing functional reach to 10 " or greater for improved standing balance.   Baseline RUE 9", LUE 10"   Time 8   Period Weeks   Status New   OT LONG TERM GOAL #3   Title Pt will improve RUE box/ blocks score to 41 blocks.   Baseline baseline: RUE 38 blocks, LUE 43 blocks   Time 8   Period Weeks   Status New   OT LONG TERM GOAL #4   Title Pt will demonstrate improved ease with fasteners as evidenced  by performing 3 button/ unbutton in 26 secs or less.   Baseline 29.22 secs   Time 8   Period Weeks   Status New   OT LONG TERM GOAL #5   Title Pt will verbalize  understanding of strategies to minimize freezing episodes   Time 8   Period Weeks   Status New               Plan - 07/27/15 1605    Clinical Impression Statement Pt is progressing towards goals. she verbalizes understanding of PWR!exercises in supine, yet she can benefit from reinforcement   Rehab Potential Good   OT Frequency 2x / week   OT Duration 8 weeks   OT Treatment/Interventions Self-care/ADL training;Therapeutic exercise;Patient/family education;Balance training;Splinting;Manual Therapy;Neuromuscular education;Therapeutic exercises;Therapeutic activities;Fluidtherapy;Cognitive remediation/compensation;Visual/perceptual remediation/compensation;Passive range of motion;Moist Heat;Contrast Bath;DME and/or AE instruction;Parrafin;Cryotherapy;Energy conservation;Ultrasound   Plan review PWR! supine, adapted strategies for ADLS   Consulted and Agree with Plan of Care Patient      Patient will benefit from skilled therapeutic intervention in order to improve the following deficits and impairments:  Decreased coordination, Decreased range of motion, Decreased safety awareness, Decreased endurance, Decreased activity tolerance, Impaired tone, Impaired UE functional use, Pain, Decreased knowledge of use of DME, Decreased balance, Decreased cognition, Decreased mobility, Decreased strength, Impaired vision/preception  Visit Diagnosis: Other symptoms and signs involving the musculoskeletal system  Other lack of coordination  Other abnormalities of gait and mobility  Other symptoms and signs involving the nervous system    Problem List Patient Active Problem List   Diagnosis Date Noted  . PSP (progressive supranuclear palsy) ([facility]) 07/15/2015  . Aortic stenosis 06/09/2015  . Dysphasia 06/09/2015  . Aortic valve disorder 05/24/2014  . OSTEOPENIA 08/11/2009  . PEDAL EDEMA 07/05/2008  . BACK PAIN 12/17/2006  . Essential hypertension 11/21/2006  . Allergic rhinitis 11/21/2006   . Osteoporosis 11/21/2006  . History of colonic polyps 11/21/2006    RINE,KATHRYN 07/27/2015, 4:31 PM [name], OTR/L [phone] Phone: [phone] 4:31 PM 07/27/2015 [facility] [address] [place], [name], [address] Phone: [phone]   Fax:  [phone]  Name: Tiffany [name] MRN: [id number] Date of Birth: May 14, 1932

## 2015-07-27 NOTE — Therapy (Signed)
[facility] [address] [place], [name], [address] Phone: [phone]   Fax:  [phone]  Physical Therapy Treatment  Patient Details  Name: Tiffany [name] MRN: [id number] Date of Birth: 17-Jul-1932 Referring Provider: Alonza Bogus, DO  Encounter Date: 07/27/2015      PT End of Session - 07/27/15 1540    Visit Number 2   Number of Visits 17   Date for PT Re-Evaluation 09/17/15   Authorization Type [employer]   Authorization Time Period G Codes and progress note required every 10 visits   PT Start Time 1449   PT Stop Time 1535   PT Time Calculation (min) 46 min   Equipment Utilized During Treatment Gait belt   Activity Tolerance Patient tolerated treatment well   Behavior During Therapy [facility] for tasks assessed/performed      Past Medical History  Diagnosis Date  . ALLERGIC RHINITIS 11/21/2006  . BACK PAIN 12/17/2006  . COLONIC POLYPS, HX OF 11/21/2006  . HYPERTENSION 11/21/2006  . OSTEOPENIA 08/11/2009  . OSTEOPOROSIS 11/21/2006  . PEDAL EDEMA 07/05/2008  . SYSTOLIC MURMUR [id number]    Past Surgical History  Procedure Laterality Date  . Hemorrhoid surgery    . Tonsillectomy    . Breast surgery      bx  . Cataract extraction Bilateral 2012    There were no vitals filed for this visit.      Subjective Assessment - 07/27/15 1452    Subjective Goes to [employer] 3x/week and walks 1 mi. on those days. "I  think a cane would be fine." Re: using an AD.   Currently in Pain? No/denies                         [facility] Adult PT Treatment/Exercise - 07/27/15 0001    Ambulation/Gait   Ambulation/Gait Yes   Ambulation/Gait Assistance 5: Supervision   Ambulation/Gait Assistance Details Trialled [facility] vs rollator  Cues required for sequencing   Ambulation Distance (Feet) 230 Feet  + 230   Assistive device Straight cane;Rollator   Gait Pattern Step-to pattern   Ambulation Surface  Level;Unlevel;Indoor;Outdoor;Paved   Gait Comments Pt demonstrated greater stability with rollator. Had   difficulty sequencing with SPC.            PWR [facility]) - 07/27/15 1524    PWR! Up x10   PWR! Rock x10   PWR! Twist x10   PWR! Step x10   Comments performed in sitting  cues for technique             PT Education - 07/27/15 1538    Education provided Yes   Education Details AD device options and safe technique with gait and transfers using SPC and rollator.  Benefits of using rollator   Person(s) Educated Patient   Methods Explanation;Demonstration;Tactile cues;Verbal cues;Handout   Comprehension Verbalized understanding;Returned demonstration;Need further instruction;Verbal cues required;Tactile cues required          PT Short Term Goals - 07/20/15 1644    PT SHORT TERM GOAL #1   Title Pt will perform initial HEP independently to maximize functional gains made in PT.   (Target date: 08/16/15)   PT SHORT TERM GOAL #2   Title Determine appropriate assistive device and request MD order, if necessary, to decrease fall risk.   PT SHORT TERM GOAL #3   Title Pt will negotiate 3 stairs without rails with step-to pattern and mod I using LRAD  to indicate pt safety using primary home entrance.   PT SHORT TERM GOAL #4   Title Pt will ambulate >300' over level, indoor surfaces with mod I using LRAD to indicate increased safety with household ambulation.    PT SHORT TERM GOAL #5   Title Pt will negotiate standard ramp and curb step with supervision using LRAD to indicate increased safety traversing commnuity obstacles.    PT SHORT TERM GOAL #6   Title Pt will improve FGA score from 13/30 to >/= 16/30 to indicate decreased fall risk.           PT Long Term Goals - 07/20/15 1655    PT LONG TERM GOAL #1   Title Pt will independently perform HEP to indicate safe daily compliance with HEP.   (Target date: 09/13/15)   PT LONG TERM GOAL #2   Title Pt will improve FGA score from  13/30 to 19/30 to indicate significant improvement in dynamic gait stability.    PT LONG TERM GOAL #3   Title Pt will ambulate >500'' over unlevel, paved surfaces with mod I using LRAD to indicate increased safety with commnuity ambulation.    PT LONG TERM GOAL #4   Title Pt will negotiate standard ramp and curb step with mod I using LRAD to indicate increased independence traversing commnuity obstacles.    PT LONG TERM GOAL #5   Title Pt will verbalize plan for community exercise following episode of PT to indicate safe transition to community fitness regimen.               Plan - 07/27/15 1541    Clinical Impression Statement Skilled session focused on trialling SPC vs. rollator for gait.  Pt had difficulty coordinating sequence with SPC but amb. well with rollator only needing cues for posture and technique for using the breaks. Initiated HEP PWR! Moves seated.   Rehab Potential Good   PT Frequency 2x / week   PT Duration 8 weeks   PT Treatment/Interventions ADLs/Self Care Home Management;DME Instruction;Gait training;Stair training;Neuromuscular re-education;Functional mobility training;Therapeutic activities;Therapeutic exercise;Balance training;Patient/family education   PT Next Visit Plan safety with gait with rollator. Discuss safety with turning (avoiding pivoting on one foot; keeping feet apart to avoid scissoring). HEP: Review seated PWR! exercises.   Consulted and Agree with Plan of Care Patient      Patient will benefit from skilled therapeutic intervention in order to improve the following deficits and impairments:  Abnormal gait, Decreased balance, Decreased coordination, Postural dysfunction, Other (comment), Dizziness, Decreased knowledge of use of DME, Decreased safety awareness  Visit Diagnosis: Unsteadiness on feet  Other symptoms and signs involving the musculoskeletal system  Other lack of coordination  Other abnormalities of gait and mobility  Unspecified  lack of coordination     Problem List Patient Active Problem List   Diagnosis Date Noted  . PSP (progressive supranuclear palsy) ([place]) 07/15/2015  . Aortic stenosis 06/09/2015  . Dysphasia 06/09/2015  . Aortic valve disorder 05/24/2014  . OSTEOPENIA 08/11/2009  . PEDAL EDEMA 07/05/2008  . BACK PAIN 12/17/2006  . Essential hypertension 11/21/2006  . Allergic rhinitis 11/21/2006  . Osteoporosis 11/21/2006  . History of colonic polyps 11/21/2006   Bjorn Loser, PTA  07/27/2015, 3:49 PM [facility] [address] [place], [name], [address] Phone: [phone]   Fax:  [phone]  Name: AMEERAH INSANA MRN: [id number] Date of Birth: 1932/04/11

## 2015-07-27 NOTE — Therapy (Signed)
[name] [address] [place], [name], [address] Phone: [phone]   Fax:  [phone]  Occupational Therapy Treatment  Patient Details  Name: Tiffany [name] MRN: [id number] Date of Birth: Dec 07, 1932 Referring Provider: Dr. Carles Collet  Encounter Date: 07/26/2015    Past Medical History  Diagnosis Date  . ALLERGIC RHINITIS 11/21/2006  . BACK PAIN 12/17/2006  . COLONIC POLYPS, HX OF 11/21/2006  . HYPERTENSION 11/21/2006  . OSTEOPENIA 08/11/2009  . OSTEOPOROSIS 11/21/2006  . PEDAL EDEMA 07/05/2008  . SYSTOLIC MURMUR [id number]    Past Surgical History  Procedure Laterality Date  . Hemorrhoid surgery    . Tonsillectomy    . Breast surgery      bx  . Cataract extraction Bilateral 2012    There were no vitals filed for this visit.      Subjective Assessment - 07/27/15 0807    Subjective  no reports of pain   Pertinent History see Epic   Patient Stated Goals hanwriting, increased ease with fasteners   Currently in Pain? No/denies             Simulated ADLS for the following activities ( using a plastic bag): donning shirt, drying back, pulling down shirt, pulling up socks and donning pants 5-10 reps each, min v.c.  For large amplitude movements and technique. Pt was instructed in coordination HEP-see pt instructions, mod v.c and repetition required.                  OT Education - 07/27/15 0809    Education provided Yes   Education Details cooordination HEP, strategies for freezing episodes handout   Person(s) Educated Patient   Methods Explanation;Demonstration;Verbal cues;Handout   Comprehension Verbalized understanding;Returned demonstration;Verbal cues required          OT Short Term Goals - 07/19/15 1628    OT SHORT TERM GOAL #1   Title I with HEP. due 08/17/15   Time 4   Period Weeks   Status New   OT SHORT TERM GOAL #2   Title Pt will demonstrate improved fine motor coordination  as evidednce by decreasing 9 hole peg test score to 45 secs or less.   Time 4   Period Weeks   Status New   OT SHORT TERM GOAL #3   Title Pt will verbalize understanding of adapted strategies for ADLs.   Time 4   Period Weeks   Status New   OT SHORT TERM GOAL #4   Title Pt will verbalize understanding of compensatory strategies for visual changes.   Time 4   Period Weeks   Status New   OT SHORT TERM GOAL #5   Title Pt will write a short paragraph with 100 % legibility and minimal decrease in letter size.   Time 4   Period Weeks   Status New           OT Long Term Goals - 07/19/15 1630    OT LONG TERM GOAL #1   Title Pt will demonstrate improved fine motor coordination as evidenced by decreasing RUE 9 hole peg test score to 40 secs or less.- due 09/16/15   Baseline RUE 51.69, LUE 29.53 secs   Time 8   Period Weeks   Status New   OT LONG TERM GOAL #2   Title Pt will improve RUE standing functional reach to 10 " or greater for improved standing balance.   Baseline RUE 9", LUE 10"   Time 8  Period Weeks   Status New   OT LONG TERM GOAL #3   Title Pt will improve RUE box/ blocks score to 41 blocks.   Baseline baseline: RUE 38 blocks, LUE 43 blocks   Time 8   Period Weeks   Status New   OT LONG TERM GOAL #4   Title Pt will demonstrate improved ease with fasteners as evidenced  by performing 3 button/ unbutton in 26 secs or less.   Baseline 29.22 secs   Time 8   Period Weeks   Status New   OT LONG TERM GOAL #5   Title Pt will verbalize understanding of strategies to minimize freezing episodes   Time 8   Period Weeks   Status New               Plan - 07/26/15 1322    Clinical Impression Statement Pt is progressing towards goals for fine motor coordination. Pt requires demonstration and repetition.   Rehab Potential Good   OT Frequency 2x / week   OT Duration 8 weeks   OT Treatment/Interventions Self-care/ADL training;Therapeutic exercise;Patient/family  education;Balance training;Splinting;Manual Therapy;Neuromuscular education;Therapeutic exercises;Therapeutic activities;Fluidtherapy;Cognitive remediation/compensation;Visual/perceptual remediation/compensation;Passive range of motion;Moist Heat;Contrast Bath;DME and/or AE instruction;Parrafin;Cryotherapy;Energy conservation;Ultrasound   Plan reinforce coordination activities   Consulted and Agree with Plan of Care Patient      Patient will benefit from skilled therapeutic intervention in order to improve the following deficits and impairments:  Decreased coordination, Decreased range of motion, Decreased safety awareness, Decreased endurance, Decreased activity tolerance, Impaired tone, Impaired UE functional use, Pain, Decreased knowledge of use of DME, Decreased balance, Decreased cognition, Decreased mobility, Decreased strength, Impaired vision/preception  Visit Diagnosis: Other symptoms and signs involving the musculoskeletal system  Other symptoms and signs involving the nervous system  Other lack of coordination    Problem List Patient Active Problem List   Diagnosis Date Noted  . PSP (progressive supranuclear palsy) ([place]) 07/15/2015  . Aortic stenosis 06/09/2015  . Dysphasia 06/09/2015  . Aortic valve disorder 05/24/2014  . OSTEOPENIA 08/11/2009  . PEDAL EDEMA 07/05/2008  . BACK PAIN 12/17/2006  . Essential hypertension 11/21/2006  . Allergic rhinitis 11/21/2006  . Osteoporosis 11/21/2006  . History of colonic polyps 11/21/2006    Tiffany [name] 07/27/2015, 8:11 AM Tiffany [name], OTR/L [phone] Phone: [phone] 8:11 AM 07/27/2015 [facility] [address] [name], [name], [address] Phone: [phone]   Fax:  [phone]  Name: Tiffany [name] MRN: [id number] Date of Birth: [birth year]

## 2015-08-01 ENCOUNTER — Encounter: Payer: Commercial Managed Care - HMO | Admitting: Occupational Therapy

## 2015-08-01 ENCOUNTER — Ambulatory Visit: Payer: Commercial Managed Care - HMO | Admitting: Physical Therapy

## 2015-08-02 ENCOUNTER — Ambulatory Visit: Payer: Commercial Managed Care - HMO | Admitting: Speech Pathology

## 2015-08-02 ENCOUNTER — Ambulatory Visit: Payer: Commercial Managed Care - HMO | Admitting: Physical Therapy

## 2015-08-02 ENCOUNTER — Ambulatory Visit: Payer: Commercial Managed Care - HMO | Admitting: Occupational Therapy

## 2015-08-02 ENCOUNTER — Encounter: Payer: Self-pay | Admitting: Physical Therapy

## 2015-08-02 DIAGNOSIS — R278 Other lack of coordination: Secondary | ICD-10-CM

## 2015-08-02 DIAGNOSIS — R2689 Other abnormalities of gait and mobility: Secondary | ICD-10-CM | POA: Diagnosis not present

## 2015-08-02 DIAGNOSIS — R279 Unspecified lack of coordination: Secondary | ICD-10-CM | POA: Diagnosis not present

## 2015-08-02 DIAGNOSIS — R2681 Unsteadiness on feet: Secondary | ICD-10-CM

## 2015-08-02 DIAGNOSIS — R29898 Other symptoms and signs involving the musculoskeletal system: Secondary | ICD-10-CM | POA: Diagnosis not present

## 2015-08-02 DIAGNOSIS — R1310 Dysphagia, unspecified: Secondary | ICD-10-CM | POA: Diagnosis not present

## 2015-08-02 DIAGNOSIS — R4701 Aphasia: Secondary | ICD-10-CM

## 2015-08-02 DIAGNOSIS — R471 Dysarthria and anarthria: Secondary | ICD-10-CM | POA: Diagnosis not present

## 2015-08-02 DIAGNOSIS — R29818 Other symptoms and signs involving the nervous system: Secondary | ICD-10-CM

## 2015-08-02 NOTE — Therapy (Signed)
[facility] [address] [name], [name], [address] Phone: [phone]   Fax:  [phone]  Physical Therapy Treatment  Patient Details  Name: Tiffany [name] MRN: [id number] Date of Birth: 1932/05/25 Referring Provider: Alonza Bogus, DO  Encounter Date: 08/02/2015      PT End of Session - 08/02/15 1239    Visit Number 3   Number of Visits 17   Date for PT Re-Evaluation 09/17/15   Authorization Type [employer]   Authorization Time Period G Codes and progress note required every 10 visits   PT Start Time 1234   PT Stop Time 1314   PT Time Calculation (min) 40 min   Equipment Utilized During Treatment Gait belt   Activity Tolerance Patient tolerated treatment well   Behavior During Therapy [facility] for tasks assessed/performed      Past Medical History  Diagnosis Date  . ALLERGIC RHINITIS 11/21/2006  . BACK PAIN 12/17/2006  . COLONIC POLYPS, HX OF 11/21/2006  . HYPERTENSION 11/21/2006  . OSTEOPENIA 08/11/2009  . OSTEOPOROSIS 11/21/2006  . PEDAL EDEMA 07/05/2008  . SYSTOLIC MURMUR [id number]    Past Surgical History  Procedure Laterality Date  . Hemorrhoid surgery    . Tonsillectomy    . Breast surgery      bx  . Cataract extraction Bilateral 2012    There were no vitals filed for this visit.      Subjective Assessment - 08/02/15 1237    Subjective Reports 1 fall since last visit, on this past Friday. Was getting home from grocery shopping at [employer] and fell going up step into home. Reports her "leg's just gave way". She fell backwards landing on her buttocks. Scraped her left forearm.  Denies any pain currently. Reports her friend helped her up. Reports she is doing the seated PWR! ex's at home.                                                     Pertinent History [name].  PMH significant for: progressive supranuclear palsy (diagnosed 07/15/15), HTN, osteopenia, aortic stenosis, bilateral  cataract extraction   Patient Stated Goals "My goal is to walk better."   Currently in Pain? No/denies   Pain Score 0-No pain            [facility] Adult PT Treatment/Exercise - 08/02/15 1251    Transfers   Transfers Sit to Stand;Stand to Sit   Sit to Stand 5: Supervision;With upper extremity assist;From bed;From chair/3-in-1   Sit to Stand Details Verbal cues for precautions/safety;Verbal cues for safe use of DME/AE   Sit to Stand Details (indicate cue type and reason) cues for hand position for increased safety with standing up to rollator (pt attempting to pull on rollator), cues for anterior weight shifting to assist with standing.                                     Stand to Sit 5: Supervision;With upper extremity assist;To bed;To chair/3-in-1   Stand to Sit Details (indicate cue type and reason) Verbal cues for precautions/safety;Verbal cues for safe use of DME/AE   Stand to Sit Details cues to stay with rollator and turn all the way to surface  before sitting down, cues to reach back with hands for safety and use arms to control descent.   Ambulation/Gait   Ambulation/Gait Yes   Ambulation/Gait Assistance 4: Min guard   Ambulation/Gait Assistance Details cues on rollator safety/brake use/hand position on brakes for safety. cues to slow down with gait for safety, for proximity to rollator and to increase base of support with gait for improved balance.                                Ambulation Distance (Feet) 630 Feet  x 2, 215 x1 weaving around barriers/furniture   Assistive device Rollator   Gait Pattern Step-through pattern;Narrow base of support   Ambulation Surface Level;Indoor           PWR [facility]) - 08/02/15 1250    PWR! exercises Moves in sitting   PWR! Up 20   PWR! Rock 20   PWR! Twist 20   PWR! Step 20   Comments visual and verbal cues on correct form and technique needed throughout all reps            PT Short Term Goals - 07/20/15 1644    PT SHORT TERM GOAL  #1   Title Pt will perform initial HEP independently to maximize functional gains made in PT.   (Target date: 08/16/15)   PT SHORT TERM GOAL #2   Title Determine appropriate assistive device and request MD order, if necessary, to decrease fall risk.   PT SHORT TERM GOAL #3   Title Pt will negotiate 3 stairs without rails with step-to pattern and mod I using LRAD to indicate pt safety using primary home entrance.   PT SHORT TERM GOAL #4   Title Pt will ambulate >300' over level, indoor surfaces with mod I using LRAD to indicate increased safety with household ambulation.    PT SHORT TERM GOAL #5   Title Pt will negotiate standard ramp and curb step with supervision using LRAD to indicate increased safety traversing commnuity obstacles.    PT SHORT TERM GOAL #6   Title Pt will improve FGA score from 13/30 to >/= 16/30 to indicate decreased fall risk.           PT Long Term Goals - 07/20/15 1655    PT LONG TERM GOAL #1   Title Pt will independently perform HEP to indicate safe daily compliance with HEP.   (Target date: 09/13/15)   PT LONG TERM GOAL #2   Title Pt will improve FGA score from 13/30 to 19/30 to indicate significant improvement in dynamic gait stability.    PT LONG TERM GOAL #3   Title Pt will ambulate >500'' over unlevel, paved surfaces with mod I using LRAD to indicate increased safety with commnuity ambulation.    PT LONG TERM GOAL #4   Title Pt will negotiate standard ramp and curb step with mod I using LRAD to indicate increased independence traversing commnuity obstacles.    PT LONG TERM GOAL #5   Title Pt will verbalize plan for community exercise following episode of PT to indicate safe transition to community fitness regimen.           Plan - 08/02/15 1239    Clinical Impression Statement Todays session focused on review of current HEP and on gait safety with rollator. Pt able to demonstrate good within session carryover of brake use, however needs moderate cues to  slow down gait  speed for safety.    Rehab Potential Good   PT Frequency 2x / week   PT Duration 8 weeks   PT Treatment/Interventions ADLs/Self Care Home Management;DME Instruction;Gait training;Stair training;Neuromuscular re-education;Functional mobility training;Therapeutic activities;Therapeutic exercise;Balance training;Patient/family education   PT Next Visit Plan safety with gait with rollator, initiate instruction on barriers with rollator (ramp, curb, stairs), outdoor surfaces weather permitting.   Consulted and Agree with Plan of Care Patient      Patient will benefit from skilled therapeutic intervention in order to improve the following deficits and impairments:  Abnormal gait, Decreased balance, Decreased coordination, Postural dysfunction, Other (comment), Dizziness, Decreased knowledge of use of DME, Decreased safety awareness  Visit Diagnosis: Other abnormalities of gait and mobility  Unsteadiness on feet  Unspecified lack of coordination     Problem List Patient Active Problem List   Diagnosis Date Noted  . PSP (progressive supranuclear palsy) ([place]) 07/15/2015  . Aortic stenosis 06/09/2015  . Dysphasia 06/09/2015  . Aortic valve disorder 05/24/2014  . OSTEOPENIA 08/11/2009  . PEDAL EDEMA 07/05/2008  . BACK PAIN 12/17/2006  . Essential hypertension 11/21/2006  . Allergic rhinitis 11/21/2006  . Osteoporosis 11/21/2006  . History of colonic polyps 11/21/2006    Willow Ora, PTA, [facility] [address] [place], [place] [address] [phone] 08/02/2015, 1:51 PM   Name: Tiffany [name] MRN: [id number] Date of Birth: 1932-07-27

## 2015-08-02 NOTE — Therapy (Signed)
[place] [address] [place], [name], [address] Phone: [phone]   Fax:  [phone]  Speech Language Pathology Treatment  Patient Details  Name: Tiffany [name] MRN: [id number] Date of Birth: 1932-05-10 Referring Provider: Bluford Kaufmann, M.D.  Encounter Date: 08/02/2015      End of Session - 08/02/15 1204    Visit Number 5   Number of Visits 16   Date for SLP Re-Evaluation 08/12/15   Authorization Type humana   SLP Start Time 1104   SLP Stop Time  1147   SLP Time Calculation (min) 43 min   Activity Tolerance Patient tolerated treatment well      Past Medical History  Diagnosis Date  . ALLERGIC RHINITIS 11/21/2006  . BACK PAIN 12/17/2006  . COLONIC POLYPS, HX OF 11/21/2006  . HYPERTENSION 11/21/2006  . OSTEOPENIA 08/11/2009  . OSTEOPOROSIS 11/21/2006  . PEDAL EDEMA 07/05/2008  . SYSTOLIC MURMUR [id number]    Past Surgical History  Procedure Laterality Date  . Hemorrhoid surgery    . Tonsillectomy    . Breast surgery      bx  . Cataract extraction Bilateral 2012    There were no vitals filed for this visit.      Subjective Assessment - 08/02/15 1109    Subjective "It's been worse today" - pt with more difficulty tallking this morning   Currently in Pain? No/denies               ADULT SLP TREATMENT - 08/02/15 1109    General Information   Behavior/Cognition Alert;Cooperative;Pleasant mood   Treatment Provided   Treatment provided Cognitive-Linquistic   Dysphagia Treatment   Temperature Spikes Noted No   Respiratory Status Room air   Treatment Methods Compensation strategy training   Patient observed directly with PO's Yes   Type of PO's observed Thin liquids  pills   Liquids provided via Cup   Other treatment/comments Pt took pills with cup sip water - reviewed recommendation for meds with puree. Pt required min verbal cues for chin tuck. Pt verbalized s/s of pna with min  questioning cues.    Pain Assessment   Pain Assessment No/denies pain   Cognitive-Linquistic Treatment   Treatment focused on Dysarthria   Skilled Treatment HEP occasional min cues for over articulation and slow rate. Faciliated verbal expression and use of compensations with repetition of tri-syllabic words which pt then generated sentence with the word. Pt utilized compensations for dysarthria with occasional min A. She required extended time and occasional min semantic cues to generate verbal sentence. She frequently said "I can't think of a sentence." Pt required cues to use more complex /diverse language as she was frequently using "is good" for each sentence.     Assessment / Recommendations / Plan   Plan Continue with current plan of care   Dysphagia Recommendations   Diet recommendations Regular   Liquids provided via Cup   Medication Administration Whole meds with puree   Postural Changes and/or Swallow Maneuvers Chin tuck   Progression Toward Goals   Progression toward goals Progressing toward goals          SLP Education - 08/02/15 1145    Education provided Yes   Education Details Compensations for dysarthira, swallow precautions   Person(s) Educated Patient   Methods Explanation;Demonstration   Comprehension Verbalized understanding;Returned demonstration;Verbal cues required          SLP Short Term Goals - 08/02/15 1203    SLP SHORT  TERM GOAL #1   Title pt will demo compensations for speech intelligibility in 10 minutes simple conversation for intelligiblity >95%   Time 2   Period Weeks   Status On-going   SLP SHORT TERM GOAL #2   Title pt will complete HEP for dysarthria with modified independence   Time 2   Period Weeks   Status On-going   SLP SHORT TERM GOAL #3   Title pt will demo functional verbal expression, using compensations, in 10 minutes simple conversation   Time 2   Period Weeks   Status On-going   SLP SHORT TERM GOAL #4   Title pt will demo  appropriate tuck posture for chin down with thin liquids during sessions 95% of the time with rare min A   Time 2   Period Weeks   Status On-going          SLP Long Term Goals - 08/02/15 1203    SLP LONG TERM GOAL #1   Title pt will complete HEP for dysphagia with modified independence   Time 6   Period Weeks   Status On-going   SLP LONG TERM GOAL #2   Title pt will demo compensations for speech intelligibility in 10 minutes mod complex conversation for intelligiblity >95%   Time 6   Period Weeks   Status On-going   SLP LONG TERM GOAL #3   Title pt will demo functional verbal expression, using compensations, in 10 minutes mod complex conversation   Time 8   Period Weeks   Status On-going   SLP LONG TERM GOAL #4   Title pt will demo compensations for dysphagia in order to decr risk of aspiration 90% of the time with POs during 3 sessions   Time 8   Period Weeks   Status On-going          Plan - 08/02/15 1153    Clinical Impression Statement Pt required extended time and min to mod A for sentence generation. She required occasional min A for carryover of dysphagia and dysarthria compensations. Continue skilled ST to maxmize verbal expression and safety of swallowing    Speech Therapy Frequency 2x / week   Treatment/Interventions Aspiration precaution training;Pharyngeal strengthening exercises;Diet toleration management by SLP;SLP instruction and feedback;Compensatory strategies;Internal/external aids;Patient/family education;Functional tasks;Cueing hierarchy   Potential to Achieve Goals Fair   Potential Considerations Severity of impairments   Consulted and Agree with Plan of Care Patient      Patient will benefit from skilled therapeutic intervention in order to improve the following deficits and impairments:   Dysarthria and anarthria  Aphasia  Dysphagia    Problem List Patient Active Problem List   Diagnosis Date Noted  . PSP (progressive supranuclear palsy)  ([place]) 07/15/2015  . Aortic stenosis 06/09/2015  . Dysphasia 06/09/2015  . Aortic valve disorder 05/24/2014  . OSTEOPENIA 08/11/2009  . PEDAL EDEMA 07/05/2008  . BACK PAIN 12/17/2006  . Essential hypertension 11/21/2006  . Allergic rhinitis 11/21/2006  . Osteoporosis 11/21/2006  . History of colonic polyps 11/21/2006    Faaris Arizpe, Annye Rusk MS, CCC-SLP 08/02/2015, 12:06 PM  [facility] [address] [place], [name], [address] Phone: [phone]   Fax:  [phone]   Name: KHAZA ZIMPFER MRN: [id number] Date of Birth: 06-Sep-1932

## 2015-08-02 NOTE — Therapy (Signed)
[facility] [address] [place], [name], [address] Phone: [phone]   Fax:  [phone]  Occupational Therapy Treatment  Patient Details  Name: Tiffany [name] MRN: [id number] Date of Birth: Jun 20, 1932 Referring Provider: Dr. Carles Collet  Encounter Date: 08/02/2015      OT End of Session - 08/02/15 1059    Visit Number 4   Number of Visits 17   Date for OT Re-Evaluation 09/17/15   Authorization Type [employer]   Authorization - Visit Number 4   Authorization - Number of Visits 10   OT Start Time [id number]   OT Stop Time 1100   OT Time Calculation (min) 42 min   Activity Tolerance Patient tolerated treatment well   Behavior During Therapy [facility] for tasks assessed/performed      Past Medical History  Diagnosis Date  . ALLERGIC RHINITIS 11/21/2006  . BACK PAIN 12/17/2006  . COLONIC POLYPS, HX OF 11/21/2006  . HYPERTENSION 11/21/2006  . OSTEOPENIA 08/11/2009  . OSTEOPOROSIS 11/21/2006  . PEDAL EDEMA 07/05/2008  . SYSTOLIC MURMUR [id number]    Past Surgical History  Procedure Laterality Date  . Hemorrhoid surgery    . Tonsillectomy    . Breast surgery      bx  . Cataract extraction Bilateral 2012    There were no vitals filed for this visit.      Subjective Assessment - 08/02/15 1020    Subjective  no reports of pain   Pertinent History see Epic   Patient Stated Goals handwriting, increased ease with fasteners   Currently in Pain? No/denies             Dynamic step and reach with emphasis on larger amplitude movements, pt demonstrates difficulty sequencing movements, mod v.c., min guard assist  Fine motor coordination task to place grooved pegs in pegboard then removing with PWR! Step, min difficulty            PWR [facility]) - 08/02/15 1100    PWR! exercises Moves in supine;Moves in standing   PWR! Up x10   PWR! Rock [employer]! Twist x20   PWR! Step x10   min v.c. -mod v.c.,therapist broke  down into stepping, then bridging, mod v.c./ difficulty   Comments min v.c. facilitation   PWR! Up x10   PWR! Rock [employer]! Twist x20   PWR Step x20   Comments mod v.c./ facilitation               OT Short Term Goals - 07/19/15 1628    OT SHORT TERM GOAL #1   Title I with HEP. due 08/17/15   Time 4   Period Weeks   Status New   OT SHORT TERM GOAL #2   Title Pt will demonstrate improved fine motor coordination as evidednce by decreasing 9 hole peg test score to 45 secs or less.   Time 4   Period Weeks   Status New   OT SHORT TERM GOAL #3   Title Pt will verbalize understanding of adapted strategies for ADLs.   Time 4   Period Weeks   Status New   OT SHORT TERM GOAL #4   Title Pt will verbalize understanding of compensatory strategies for visual changes.   Time 4   Period Weeks   Status New   OT SHORT TERM GOAL #5   Title Pt will write a short paragraph with 100 % legibility and minimal decrease in letter size.  Time 4   Period Weeks   Status New           OT Long Term Goals - 07/19/15 1630    OT LONG TERM GOAL #1   Title Pt will demonstrate improved fine motor coordination as evidenced by decreasing RUE 9 hole peg test score to 40 secs or less.- due 09/16/15   Baseline RUE 51.69, LUE 29.53 secs   Time 8   Period Weeks   Status New   OT LONG TERM GOAL #2   Title Pt will improve RUE standing functional reach to 10 " or greater for improved standing balance.   Baseline RUE 9", LUE 10"   Time 8   Period Weeks   Status New   OT LONG TERM GOAL #3   Title Pt will improve RUE box/ blocks score to 41 blocks.   Baseline baseline: RUE 38 blocks, LUE 43 blocks   Time 8   Period Weeks   Status New   OT LONG TERM GOAL #4   Title Pt will demonstrate improved ease with fasteners as evidenced  by performing 3 button/ unbutton in 26 secs or less.   Baseline 29.22 secs   Time 8   Period Weeks   Status New   OT LONG TERM GOAL #5   Title Pt will verbalize  understanding of strategies to minimize freezing episodes   Time 8   Period Weeks   Status New               Plan - 08/02/15 1057    Clinical Impression Statement Pt is progressing towards goals. She benefits from repetition and  reinforcement of exercises due to difficulty with sequencing and timing.   Rehab Potential Good   OT Frequency 2x / week   OT Duration 8 weeks   OT Treatment/Interventions Self-care/ADL training;Therapeutic exercise;Patient/family education;Balance training;Splinting;Manual Therapy;Neuromuscular education;Therapeutic exercises;Therapeutic activities;Fluidtherapy;Cognitive remediation/compensation;Visual/perceptual remediation/compensation;Passive range of motion;Moist Heat;Contrast Bath;DME and/or AE instruction;Parrafin;Cryotherapy;Energy conservation;Ultrasound   Plan compensatory strategies for visual changes, big movements with functional activity   Consulted and Agree with Plan of Care Patient      Patient will benefit from skilled therapeutic intervention in order to improve the following deficits and impairments:  Decreased coordination, Decreased range of motion, Decreased safety awareness, Decreased endurance, Decreased activity tolerance, Impaired tone, Impaired UE functional use, Pain, Decreased knowledge of use of DME, Decreased balance, Decreased cognition, Decreased mobility, Decreased strength, Impaired vision/preception  Visit Diagnosis: Other symptoms and signs involving the musculoskeletal system  Other lack of coordination  Other symptoms and signs involving the nervous system    Problem List Patient Active Problem List   Diagnosis Date Noted  . PSP (progressive supranuclear palsy) ([place]) 07/15/2015  . Aortic stenosis 06/09/2015  . Dysphasia 06/09/2015  . Aortic valve disorder 05/24/2014  . OSTEOPENIA 08/11/2009  . PEDAL EDEMA 07/05/2008  . BACK PAIN 12/17/2006  . Essential hypertension 11/21/2006  . Allergic rhinitis  11/21/2006  . Osteoporosis 11/21/2006  . History of colonic polyps 11/21/2006    Keydi Giel 08/02/2015, 2:35 PM [name], OTR/L [phone] Phone: [phone] 2:35 PM 05/09/2017[facility] [address] [place], [name], [address] Phone: [phone]   Fax:  [phone]  Name: JALESHA BARTON MRN: [id number] Date of Birth: 1932-07-16

## 2015-08-04 ENCOUNTER — Ambulatory Visit: Payer: Commercial Managed Care - HMO | Admitting: Physical Therapy

## 2015-08-04 ENCOUNTER — Encounter: Payer: Self-pay | Admitting: Physical Therapy

## 2015-08-04 ENCOUNTER — Ambulatory Visit: Payer: Commercial Managed Care - HMO | Admitting: Occupational Therapy

## 2015-08-04 DIAGNOSIS — R2689 Other abnormalities of gait and mobility: Secondary | ICD-10-CM

## 2015-08-04 DIAGNOSIS — R29898 Other symptoms and signs involving the musculoskeletal system: Secondary | ICD-10-CM

## 2015-08-04 DIAGNOSIS — R471 Dysarthria and anarthria: Secondary | ICD-10-CM | POA: Diagnosis not present

## 2015-08-04 DIAGNOSIS — R278 Other lack of coordination: Secondary | ICD-10-CM

## 2015-08-04 DIAGNOSIS — R279 Unspecified lack of coordination: Secondary | ICD-10-CM

## 2015-08-04 DIAGNOSIS — R29818 Other symptoms and signs involving the nervous system: Secondary | ICD-10-CM | POA: Diagnosis not present

## 2015-08-04 DIAGNOSIS — R1310 Dysphagia, unspecified: Secondary | ICD-10-CM | POA: Diagnosis not present

## 2015-08-04 DIAGNOSIS — R2681 Unsteadiness on feet: Secondary | ICD-10-CM | POA: Diagnosis not present

## 2015-08-04 DIAGNOSIS — R4701 Aphasia: Secondary | ICD-10-CM | POA: Diagnosis not present

## 2015-08-04 NOTE — Patient Instructions (Signed)
Fall Prevention in the Home   Falls can cause injuries. They can happen to people of all ages. There are many things you can do to make your home safe and to help prevent falls.   WHAT CAN I DO ON THE OUTSIDE OF MY HOME?  · Regularly fix the edges of walkways and driveways and fix any cracks.  · Remove anything that might make you trip as you walk through a door, such as a raised step or threshold.  · Trim any bushes or trees on the path to your home.  · Use bright outdoor lighting.  · Clear any walking paths of anything that might make someone trip, such as rocks or tools.  · Regularly check to see if handrails are loose or broken. Make sure that both sides of any steps have handrails.  · Any raised decks and porches should have guardrails on the edges.  · Have any leaves, snow, or ice cleared regularly.  · Use sand or salt on walking paths during winter.  · Clean up any spills in your garage right away. This includes oil or grease spills.  WHAT CAN I DO IN THE BATHROOM?   · Use night lights.  · Install grab bars by the toilet and in the tub and shower. Do not use towel bars as grab bars.  · Use non-skid mats or decals in the tub or shower.  · If you need to sit down in the shower, use a plastic, non-slip stool.  · Keep the floor dry. Clean up any water that spills on the floor as soon as it happens.  · Remove soap buildup in the tub or shower regularly.  · Attach bath mats securely with double-sided non-slip rug tape.  · Do not have throw rugs and other things on the floor that can make you trip.  WHAT CAN I DO IN THE BEDROOM?  · Use night lights.  · Make sure that you have a light by your bed that is easy to reach.  · Do not use any sheets or blankets that are too big for your bed. They should not hang down onto the floor.  · Have a firm chair that has side arms. You can use this for support while you get dressed.  · Do not have throw rugs and other things on the floor that can make you trip.  WHAT CAN I DO IN  THE KITCHEN?  · Clean up any spills right away.  · Avoid walking on wet floors.  · Keep items that you use a lot in easy-to-reach places.  · If you need to reach something above you, use a strong step stool that has a grab bar.  · Keep electrical cords out of the way.  · Do not use floor polish or wax that makes floors slippery. If you must use wax, use non-skid floor wax.  · Do not have throw rugs and other things on the floor that can make you trip.  WHAT CAN I DO WITH MY STAIRS?  · Do not leave any items on the stairs.  · Make sure that there are handrails on both sides of the stairs and use them. Fix handrails that are broken or loose. Make sure that handrails are as long as the stairways.  · Check any carpeting to make sure that it is firmly attached to the stairs. Fix any carpet that is loose or worn.  · Avoid having throw rugs at the top   or bottom of the stairs. If you do have throw rugs, attach them to the floor with carpet tape.  · Make sure that you have a light switch at the top of the stairs and the bottom of the stairs. If you do not have them, ask someone to add them for you.  WHAT ELSE CAN I DO TO HELP PREVENT FALLS?  · Wear shoes that:    Do not have high heels.    Have rubber bottoms.    Are comfortable and fit you well.    Are closed at the toe. Do not wear sandals.  · If you use a stepladder:    Make sure that it is fully opened. Do not climb a closed stepladder.    Make sure that both sides of the stepladder are locked into place.    Ask someone to hold it for you, if possible.  · Clearly mark and make sure that you can see:    Any grab bars or handrails.    First and last steps.    Where the edge of each step is.  · Use tools that help you move around (mobility aids) if they are needed. These include:    Canes.    Walkers.    Scooters.    Crutches.  · Turn on the lights when you go into a dark area. Replace any light bulbs as soon as they burn out.  · Set up your furniture so you have a clear  path. Avoid moving your furniture around.  · If any of your floors are uneven, fix them.  · If there are any pets around you, be aware of where they are.  · Review your medicines with your doctor. Some medicines can make you feel dizzy. This can increase your chance of falling.  Ask your doctor what other things that you can do to help prevent falls.     This information is not intended to replace advice given to you by your health care provider. Make sure you discuss any questions you have with your health care provider.     Document Released: 01/06/2009 Document Revised: 07/27/2014 Document Reviewed: 04/16/2014  Elsevier Interactive Patient Education ©2016 [employer]

## 2015-08-04 NOTE — Therapy (Signed)
[facility] [address] [place], [name], [address] Phone: [phone]   Fax:  [phone]  Occupational Therapy Treatment  Patient Details  Name: Tiffany [name] MRN: [id number] Date of Birth: 1932/12/10 Referring Provider: Dr. Carles Collet  Encounter Date: 08/04/2015      OT End of Session - 08/04/15 1508    Visit Number 5   Number of Visits 17   Date for OT Re-Evaluation 09/17/15   Authorization Type [employer]   Authorization - Visit Number 5   Authorization - Number of Visits 10   OT Start Time [id number]   OT Stop Time 1535   OT Time Calculation (min) 39 min   Activity Tolerance Patient tolerated treatment well   Behavior During Therapy [facility] for tasks assessed/performed      Past Medical History  Diagnosis Date  . ALLERGIC RHINITIS 11/21/2006  . BACK PAIN 12/17/2006  . COLONIC POLYPS, HX OF 11/21/2006  . HYPERTENSION 11/21/2006  . OSTEOPENIA 08/11/2009  . OSTEOPOROSIS 11/21/2006  . PEDAL EDEMA 07/05/2008  . SYSTOLIC MURMUR [id number]    Past Surgical History  Procedure Laterality Date  . Hemorrhoid surgery    . Tonsillectomy    . Breast surgery      bx  . Cataract extraction Bilateral 2012    There were no vitals filed for this visit.      Subjective Assessment - 08/04/15 1504    Subjective  "My handwriting is not good"   Pertinent History see Epic   Patient Stated Goals handwriting, increased ease with fasteners   Currently in Pain? No/denies         Self Care:    Dressing:   Practiced buttoning/unbuttoning shirt on table top with min cues for use of PWR! Hands prior to buttoning and use of deliberate/large amplitude movements after instruction.  Pt demo improvement with repetition and use of large amplitude movements.   Writing:  Pt instructed in use of handwriting strategies (built-up grip, focus on writing big/letter formation using visual target, PWR! Hands).  Practiced copying 5 sentences  with min cues. Pt with min-mod decr in size but incr legibility (approx 90%) with use of strategies.  Issued built-up/foam grip for home.  Practiced continuous "L" with focus/min cues for large amplitude/spacing. Min-mod decr in size noted.      Copying small peg design with use/min cues for PWR! Hands prior to picking up pegs with RUE.  Pt demo improved coordination with use of this strategy (min difficulty) and copied design with good accuracy.                             OT Education - 08/04/15 1527    Education Details PD Handwriting Tips with handout; Moving head/objects to compensate for visual deficits; Use of PWR! hands prior to picking up small objects or completing fasteners   Person(s) Educated Patient   Methods Explanation;Demonstration;Verbal cues   Comprehension Verbalized understanding          OT Short Term Goals - 07/19/15 1628    OT SHORT TERM GOAL #1   Title I with HEP. due 08/17/15   Time 4   Period Weeks   Status New   OT SHORT TERM GOAL #2   Title Pt will demonstrate improved fine motor coordination as evidednce by decreasing 9 hole peg test score to 45 secs or less.   Time 4   Period Weeks  Status New   OT SHORT TERM GOAL #3   Title Pt will verbalize understanding of adapted strategies for ADLs.   Time 4   Period Weeks   Status New   OT SHORT TERM GOAL #4   Title Pt will verbalize understanding of compensatory strategies for visual changes.   Time 4   Period Weeks   Status New   OT SHORT TERM GOAL #5   Title Pt will write a short paragraph with 100 % legibility and minimal decrease in letter size.   Time 4   Period Weeks   Status New           OT Long Term Goals - 07/19/15 1630    OT LONG TERM GOAL #1   Title Pt will demonstrate improved fine motor coordination as evidenced by decreasing RUE 9 hole peg test score to 40 secs or less.- due 09/16/15   Baseline RUE 51.69, LUE 29.53 secs   Time 8   Period Weeks   Status  New   OT LONG TERM GOAL #2   Title Pt will improve RUE standing functional reach to 10 " or greater for improved standing balance.   Baseline RUE 9", LUE 10"   Time 8   Period Weeks   Status New   OT LONG TERM GOAL #3   Title Pt will improve RUE box/ blocks score to 41 blocks.   Baseline baseline: RUE 38 blocks, LUE 43 blocks   Time 8   Period Weeks   Status New   OT LONG TERM GOAL #4   Title Pt will demonstrate improved ease with fasteners as evidenced  by performing 3 button/ unbutton in 26 secs or less.   Baseline 29.22 secs   Time 8   Period Weeks   Status New   OT LONG TERM GOAL #5   Title Pt will verbalize understanding of strategies to minimize freezing episodes   Time 8   Period Weeks   Status New               Plan - 08/04/15 1530    Clinical Impression Statement Pt is progressing towards goals with improving coordination.  Pt demo improved writing amplitude/legibility with strategies.   Rehab Potential Good   OT Frequency 2x / week   OT Duration 8 weeks   OT Treatment/Interventions Self-care/ADL training;Therapeutic exercise;Patient/family education;Balance training;Splinting;Manual Therapy;Neuromuscular education;Therapeutic exercises;Therapeutic activities;Fluidtherapy;Cognitive remediation/compensation;Visual/perceptual remediation/compensation;Passive range of motion;Moist Heat;Contrast Bath;DME and/or AE instruction;Parrafin;Cryotherapy;Energy conservation;Ultrasound   Plan continue with compensation strategies for visual changes; large amplitude movements for ADLs (?handout); PWR! moves in quadraped/modified quadraped to assist with fall recovery   OT Home Exercise Plan Education provided:  coordination HEP; PD Handwriting tips (08/04/15)   Consulted and Agree with Plan of Care Patient      Patient will benefit from skilled therapeutic intervention in order to improve the following deficits and impairments:  Decreased coordination, Decreased range of  motion, Decreased safety awareness, Decreased endurance, Decreased activity tolerance, Impaired tone, Impaired UE functional use, Pain, Decreased knowledge of use of DME, Decreased balance, Decreased cognition, Decreased mobility, Decreased strength, Impaired vision/preception  Visit Diagnosis: Other symptoms and signs involving the musculoskeletal system  Other symptoms and signs involving the nervous system  Other lack of coordination  Unsteadiness on feet  Unspecified lack of coordination    Problem List Patient Active Problem List   Diagnosis Date Noted  . PSP (progressive supranuclear palsy) ([place]) 07/15/2015  . Aortic stenosis 06/09/2015  . Dysphasia 06/09/2015  .  Aortic valve disorder 05/24/2014  . OSTEOPENIA 08/11/2009  . PEDAL EDEMA 07/05/2008  . BACK PAIN 12/17/2006  . Essential hypertension 11/21/2006  . Allergic rhinitis 11/21/2006  . Osteoporosis 11/21/2006  . History of colonic polyps 11/21/2006    [facility] 08/04/2015, 5:38 PM  [facility] [address] [place], [name], [address] Phone: [phone]   Fax:  [phone]  Name: TYSON COTTIER MRN: [id number] Date of Birth: 12-Feb-1933  Vianne Bulls, OTR/L [facility] [address] [place], [place]  [address] [phone] phone [phone] 08/04/2015 5:40 PM

## 2015-08-07 NOTE — Therapy (Signed)
[name] [address] [place], [name], [address] Phone: [phone]   Fax:  [phone]  Physical Therapy Treatment  Patient Details  Name: Tiffany [name] MRN: [id number] Date of Birth: 13-Jan-1933 Referring Provider: Alonza Bogus, DO  Encounter Date: 08/04/2015   08/04/15 1542  PT Visits / Re-Eval  Visit Number 4  Number of Visits 17  Date for PT Re-Evaluation 09/17/15  Authorization  Authorization Type [employer]  Authorization Time Period G Codes and progress note required every 10 visits  PT Time Calculation  PT Start Time 1535  PT Stop Time 1615  PT Time Calculation (min) 40 min  PT - End of Session  Equipment Utilized During Treatment Gait belt  Activity Tolerance Patient tolerated treatment well  Behavior During Therapy [facility] for tasks assessed/performed     Past Medical History  Diagnosis Date  . ALLERGIC RHINITIS 11/21/2006  . BACK PAIN 12/17/2006  . COLONIC POLYPS, HX OF 11/21/2006  . HYPERTENSION 11/21/2006  . OSTEOPENIA 08/11/2009  . OSTEOPOROSIS 11/21/2006  . PEDAL EDEMA 07/05/2008  . SYSTOLIC MURMUR [id number]    Past Surgical History  Procedure Laterality Date  . Hemorrhoid surgery    . Tonsillectomy    . Breast surgery      bx  . Cataract extraction Bilateral 2012    There were no vitals filed for this visit.     08/04/15 1539  Symptoms/Limitations  Subjective No new complaints. No more falls. No pain to report.  Pertinent History [name].  PMH significant for: progressive supranuclear palsy (diagnosed 07/15/15), HTN, osteopenia, aortic stenosis, bilateral cataract extraction  Patient Stated Goals "My goal is to walk better."  Pain Assessment  Currently in Pain? No/denies  Pain Score 0     08/04/15 1603  Transfers  Transfers Sit to Stand;Stand to Sit  Sit to Stand 5: Supervision;With upper extremity assist;From bed;From chair/3-in-1  Sit to Stand Details  Verbal cues for precautions/safety;Verbal cues for safe use of DME/AE  Stand to Sit 5: Supervision;With upper extremity assist;To bed;To chair/3-in-1  Stand to Sit Details (indicate cue type and reason) Verbal cues for precautions/safety;Verbal cues for safe use of DME/AE  Ambulation/Gait  Ambulation/Gait Yes  Ambulation/Gait Assistance 5: Supervision  Ambulation/Gait Assistance Details supervision with cues on rollator proximity with gait on indoor surfaces,   Ambulation Distance (Feet) 220 Feet (x1, 500 x1 indoor/outdoor combined)  Assistive device Rollator  Gait Pattern Step-through pattern;Narrow base of support  Ambulation Surface Level;Indoor       08/05/15 2348  PT Education  Education provided Yes  Education Details Fall prevention strategies  Person(s) Educated Patient  Methods Explanation;Demonstration;Verbal cues;Handout  Comprehension Verbalized understanding;Returned demonstration;Need further instruction         PT Short Term Goals - 07/20/15 1644    PT SHORT TERM GOAL #1   Title Pt will perform initial HEP independently to maximize functional gains made in PT.   (Target date: 08/16/15)   PT SHORT TERM GOAL #2   Title Determine appropriate assistive device and request MD order, if necessary, to decrease fall risk.   PT SHORT TERM GOAL #3   Title Pt will negotiate 3 stairs without rails with step-to pattern and mod I using LRAD to indicate pt safety using primary home entrance.   PT SHORT TERM GOAL #4   Title Pt will ambulate >300' over level, indoor surfaces with mod I using LRAD to indicate increased safety with household ambulation.  PT SHORT TERM GOAL #5   Title Pt will negotiate standard ramp and curb step with supervision using LRAD to indicate increased safety traversing commnuity obstacles.    PT SHORT TERM GOAL #6   Title Pt will improve FGA score from 13/30 to >/= 16/30 to indicate decreased fall risk.           PT Long Term Goals - 07/20/15 1655     PT LONG TERM GOAL #1   Title Pt will independently perform HEP to indicate safe daily compliance with HEP.   (Target date: 09/13/15)   PT LONG TERM GOAL #2   Title Pt will improve FGA score from 13/30 to 19/30 to indicate significant improvement in dynamic gait stability.    PT LONG TERM GOAL #3   Title Pt will ambulate >500'' over unlevel, paved surfaces with mod I using LRAD to indicate increased safety with commnuity ambulation.    PT LONG TERM GOAL #4   Title Pt will negotiate standard ramp and curb step with mod I using LRAD to indicate increased independence traversing commnuity obstacles.    PT LONG TERM GOAL #5   Title Pt will verbalize plan for community exercise following episode of PT to indicate safe transition to community fitness regimen.        08/04/15 1542  Plan  Clinical Impression Statement Today's session focused on fall prevention strategies with handout provided. Then contiinued to work on Designer, multimedia with rollator. Pt is making steady progress toward goals.  Pt will benefit from skilled therapeutic intervention in order to improve on the following deficits Abnormal gait;Decreased balance;Decreased coordination;Postural dysfunction;Other (comment);Dizziness;Decreased knowledge of use of DME;Decreased safety awareness  Rehab Potential Good  PT Frequency 2x / week  PT Duration 8 weeks  PT Treatment/Interventions ADLs/Self Care Home Management;DME Instruction;Gait training;Stair training;Neuromuscular re-education;Functional mobility training;Therapeutic activities;Therapeutic exercise;Balance training;Patient/family education  PT Next Visit Plan safety with gait with rollator, initiate instruction on barriers with rollator (ramp, curb, stairs), outdoor surfaces weather permitting.  Consulted and Agree with Plan of Care Patient       Patient will benefit from skilled therapeutic intervention in order to improve the following deficits and impairments:  Abnormal gait,  Decreased balance, Decreased coordination, Postural dysfunction, Other (comment), Dizziness, Decreased knowledge of use of DME, Decreased safety awareness  Visit Diagnosis: Unspecified lack of coordination  Other abnormalities of gait and mobility  Unsteadiness on feet     Problem List Patient Active Problem List   Diagnosis Date Noted  . PSP (progressive supranuclear palsy) ([place]) 07/15/2015  . Aortic stenosis 06/09/2015  . Dysphasia 06/09/2015  . Aortic valve disorder 05/24/2014  . OSTEOPENIA 08/11/2009  . PEDAL EDEMA 07/05/2008  . BACK PAIN 12/17/2006  . Essential hypertension 11/21/2006  . Allergic rhinitis 11/21/2006  . Osteoporosis 11/21/2006  . History of colonic polyps 11/21/2006    Willow Ora, PTA, [facility] [address], [place], [place] [address] [phone] 08/07/2015, 11:45 PM   Name: Tiffany [name] MRN: [id number] Date of Birth: 03/01/1933

## 2015-08-09 ENCOUNTER — Encounter: Payer: Self-pay | Admitting: Physical Therapy

## 2015-08-09 ENCOUNTER — Ambulatory Visit: Payer: Commercial Managed Care - HMO

## 2015-08-09 ENCOUNTER — Ambulatory Visit: Payer: Commercial Managed Care - HMO | Admitting: Physical Therapy

## 2015-08-09 ENCOUNTER — Ambulatory Visit: Payer: Commercial Managed Care - HMO | Admitting: Occupational Therapy

## 2015-08-09 DIAGNOSIS — R2689 Other abnormalities of gait and mobility: Secondary | ICD-10-CM

## 2015-08-09 DIAGNOSIS — R2681 Unsteadiness on feet: Secondary | ICD-10-CM

## 2015-08-09 DIAGNOSIS — R4701 Aphasia: Secondary | ICD-10-CM

## 2015-08-09 DIAGNOSIS — R278 Other lack of coordination: Secondary | ICD-10-CM | POA: Diagnosis not present

## 2015-08-09 DIAGNOSIS — R29898 Other symptoms and signs involving the musculoskeletal system: Secondary | ICD-10-CM | POA: Diagnosis not present

## 2015-08-09 DIAGNOSIS — R29818 Other symptoms and signs involving the nervous system: Secondary | ICD-10-CM

## 2015-08-09 DIAGNOSIS — R279 Unspecified lack of coordination: Secondary | ICD-10-CM | POA: Diagnosis not present

## 2015-08-09 DIAGNOSIS — R471 Dysarthria and anarthria: Secondary | ICD-10-CM | POA: Diagnosis not present

## 2015-08-09 DIAGNOSIS — R1310 Dysphagia, unspecified: Secondary | ICD-10-CM | POA: Diagnosis not present

## 2015-08-09 NOTE — Therapy (Signed)
[facility] [address] [place], [name], [address] Phone: [phone]   Fax:  [phone]  Speech Language Pathology Treatment  Patient Details  Name: Tiffany [name] MRN: [id number] Date of Birth: July 12, 1932 Referring Provider: Bluford Kaufmann, M.D.  Encounter Date: 08/09/2015      End of Session - 08/09/15 1647    Visit Number 6   Number of Visits 16   Date for SLP Re-Evaluation 08/12/15   SLP Start Time 1533   SLP Stop Time  1618   SLP Time Calculation (min) 45 min   Activity Tolerance Patient tolerated treatment well      Past Medical History  Diagnosis Date  . ALLERGIC RHINITIS 11/21/2006  . BACK PAIN 12/17/2006  . COLONIC POLYPS, HX OF 11/21/2006  . HYPERTENSION 11/21/2006  . OSTEOPENIA 08/11/2009  . OSTEOPOROSIS 11/21/2006  . PEDAL EDEMA 07/05/2008  . SYSTOLIC MURMUR [id number]    Past Surgical History  Procedure Laterality Date  . Hemorrhoid surgery    . Tonsillectomy    . Breast surgery      bx  . Cataract extraction Bilateral 2012    There were no vitals filed for this visit.      Subjective Assessment - 08/09/15 1538    Subjective "I saw the other lady - she gave me homework and it was hard."   Currently in Pain? No/denies               ADULT SLP TREATMENT - 08/09/15 1539    General Information   Behavior/Cognition Alert;Cooperative;Pleasant mood   Treatment Provided   Treatment provided Cognitive-Linquistic   Dysphagia Treatment   Temperature Spikes Noted No   Treatment Methods Compensation strategy training   Patient observed directly with PO's Yes   Type of PO's observed Thin liquids;Dysphagia 1 (puree)  "pills" (M&Ms, TicTacs)   Liquids provided via Cup   Other treatment/comments Pt took H2O with present but minimal chin tuck. SLP cued pt to look between legs (upper thighs) to the floor for adequate tuck position. SLP had pt try applesauce with M&Ms and TicTacs to  simulate meds. Most success with placing one at a time in puree, covering, and then scooping and swallowing 7/7 "pills" with clearance the first swallow using that technique.  SLP encouraged pt to use this technique at home.   Pain Assessment   Pain Assessment No/denies pain   Cognitive-Linquistic Treatment   Treatment focused on Dysarthria   Skilled Treatment Pt read multisyllable words with slower rate and overarticulation 85%. In sentence responses pt req'd mod-max a consistently for language and did very little overarticulation. SLP discussed with pt the need for homework when pt told SLP do not give her "too much" homework.   Assessment / Recommendations / Plan   Plan Continue with current plan of care   Dysphagia Recommendations   Diet recommendations Regular   Liquids provided via Cup   Medication Administration Whole meds with puree   Postural Changes and/or Swallow Maneuvers Chin tuck   Progression Toward Goals   Progression toward goals Progressing toward goals          SLP Education - 08/09/15 1647    Education Details homework - why needed   Person(s) Educated Patient   Methods Explanation   Comprehension Verbalized understanding          SLP Short Term Goals - 08/09/15 1650    SLP SHORT TERM GOAL #1   Title pt will demo compensations for  speech intelligibility in 10 minutes simple conversation for intelligiblity >95%   Time 1   Period Weeks   Status On-going   SLP SHORT TERM GOAL #2   Title pt will complete HEP for dysarthria with modified independence   Time 1   Period Weeks   Status On-going   SLP SHORT TERM GOAL #3   Title pt will demo functional verbal expression, using compensations, in 10 minutes simple conversation   Time 1   Period Weeks   Status On-going   SLP SHORT TERM GOAL #4   Title pt will demo appropriate tuck posture for chin down with thin liquids during sessions 95% of the time with rare min A   Time 1   Period Weeks   Status On-going           SLP Long Term Goals - 08/09/15 1650    SLP LONG TERM GOAL #1   Title pt will complete HEP for dysphagia with modified independence   Time 6   Period Weeks   Status On-going   SLP LONG TERM GOAL #2   Title pt will demo compensations for speech intelligibility in 10 minutes mod complex conversation for intelligiblity >95%   Time 6   Period Weeks   Status On-going   SLP LONG TERM GOAL #3   Title pt will demo functional verbal expression, using compensations, in 10 minutes mod complex conversation   Time 8   Period Weeks   Status On-going   SLP LONG TERM GOAL #4   Title pt will demo compensations for dysphagia in order to decr risk of aspiration 90% of the time with POs during 3 sessions   Time 8   Period Weeks   Status On-going          Plan - 08/09/15 1648    Clinical Impression Statement Pt required extended time and mod A for sentence generation. She required initial min cues (extent of chin tuck) faded to independent during session, for carryover of chin tuck. Continue skilled ST to maxmize verbal expression and safety of swallowing    Speech Therapy Frequency 2x / week   Duration 1 week   Treatment/Interventions Aspiration precaution training;Pharyngeal strengthening exercises;Diet toleration management by SLP;SLP instruction and feedback;Compensatory strategies;Internal/external aids;Patient/family education;Functional tasks;Cueing hierarchy   Potential to Achieve Goals Fair   Potential Considerations Severity of impairments   Consulted and Agree with Plan of Care Patient      Patient will benefit from skilled therapeutic intervention in order to improve the following deficits and impairments:   Aphasia  Dysphagia  Dysarthria and anarthria    Problem List Patient Active Problem List   Diagnosis Date Noted  . PSP (progressive supranuclear palsy) ([place]) 07/15/2015  . Aortic stenosis 06/09/2015  . Dysphasia 06/09/2015  . Aortic valve disorder  05/24/2014  . OSTEOPENIA 08/11/2009  . PEDAL EDEMA 07/05/2008  . BACK PAIN 12/17/2006  . Essential hypertension 11/21/2006  . Allergic rhinitis 11/21/2006  . Osteoporosis 11/21/2006  . History of colonic polyps 11/21/2006    [facility] ,[place], CCC-SLP  08/09/2015, 4:52 PM  [facility] [address] [place], [name], [address] Phone: [phone]   Fax:  [phone]   Name: Tiffany [name] MRN: [id number] Date of Birth: 09-09-1932

## 2015-08-09 NOTE — Therapy (Signed)
[facility] [address] [place], [name], [address] Phone: [phone]   Fax:  [phone]  Occupational Therapy Treatment  Patient Details  Name: Tiffany [name] MRN: [id number] Date of Birth: 1933/03/22 Referring Provider: Dr. Carles Collet  Encounter Date: 08/09/2015      OT End of Session - 08/09/15 1502    Visit Number 6   Number of Visits 17   Date for OT Re-Evaluation 09/17/15   Authorization Type [employer]   Authorization - Visit Number 6   Authorization - Number of Visits 10   OT Start Time [id number]   OT Stop Time 1445   OT Time Calculation (min) 40 min   Activity Tolerance Patient tolerated treatment well   Behavior During Therapy [facility] for tasks assessed/performed      Past Medical History  Diagnosis Date  . ALLERGIC RHINITIS 11/21/2006  . BACK PAIN 12/17/2006  . COLONIC POLYPS, HX OF 11/21/2006  . HYPERTENSION 11/21/2006  . OSTEOPENIA 08/11/2009  . OSTEOPOROSIS 11/21/2006  . PEDAL EDEMA 07/05/2008  . SYSTOLIC MURMUR [id number]    Past Surgical History  Procedure Laterality Date  . Hemorrhoid surgery    . Tonsillectomy    . Breast surgery      bx  . Cataract extraction Bilateral 2012    There were no vitals filed for this visit.              Standing to copy small peg design, min v.c. For design and large amplitude movements.         PWR [facility]) - 08/09/15 1432    PWR! exercises Moves in [place];Moves in prone   PWR! Up x10   PWR! Rock x10   PWR! Twist x10   PWR! Step x10  hands on a chair seat   Comments mod v.c./ demo   PWR! Up x10   PWR! Rock x10   PWR! Twist x10   PWR! Step x10   Comments mod v.c./ demo             OT Education - 08/09/15 1502    Education provided Yes   Education Details PWR! supine, quadraped   Person(s) Educated Patient   Methods Explanation;Demonstration;Verbal cues;Tactile cues   Comprehension Verbalized understanding;Need further  instruction          OT Short Term Goals - 07/19/15 1628    OT SHORT TERM GOAL #1   Title I with HEP. due 08/17/15   Time 4   Period Weeks   Status New   OT SHORT TERM GOAL #2   Title Pt will demonstrate improved fine motor coordination as evidednce by decreasing 9 hole peg test score to 45 secs or less.   Time 4   Period Weeks   Status New   OT SHORT TERM GOAL #3   Title Pt will verbalize understanding of adapted strategies for ADLs.   Time 4   Period Weeks   Status New   OT SHORT TERM GOAL #4   Title Pt will verbalize understanding of compensatory strategies for visual changes.   Time 4   Period Weeks   Status New   OT SHORT TERM GOAL #5   Title Pt will write a short paragraph with 100 % legibility and minimal decrease in letter size.   Time 4   Period Weeks   Status New           OT Long Term Goals - 07/19/15 1630    OT  LONG TERM GOAL #1   Title Pt will demonstrate improved fine motor coordination as evidenced by decreasing RUE 9 hole peg test score to 40 secs or less.- due 09/16/15   Baseline RUE 51.69, LUE 29.53 secs   Time 8   Period Weeks   Status New   OT LONG TERM GOAL #2   Title Pt will improve RUE standing functional reach to 10 " or greater for improved standing balance.   Baseline RUE 9", LUE 10"   Time 8   Period Weeks   Status New   OT LONG TERM GOAL #3   Title Pt will improve RUE box/ blocks score to 41 blocks.   Baseline baseline: RUE 38 blocks, LUE 43 blocks   Time 8   Period Weeks   Status New   OT LONG TERM GOAL #4   Title Pt will demonstrate improved ease with fasteners as evidenced  by performing 3 button/ unbutton in 26 secs or less.   Baseline 29.22 secs   Time 8   Period Weeks   Status New   OT LONG TERM GOAL #5   Title Pt will verbalize understanding of strategies to minimize freezing episodes   Time 8   Period Weeks   Status New               Plan - 08/09/15 1500    Clinical Impression Statement Pt is progressing  towards goals. She requires repetition of exercises for timing and large amplitude movments.   Rehab Potential Good   OT Frequency 2x / week   OT Duration 8 weeks   OT Treatment/Interventions Self-care/ADL training;Therapeutic exercise;Patient/family education;Balance training;Splinting;Manual Therapy;Neuromuscular education;Therapeutic exercises;Therapeutic activities;Fluidtherapy;Cognitive remediation/compensation;Visual/perceptual remediation/compensation;Passive range of motion;Moist Heat;Contrast Bath;DME and/or AE instruction;Parrafin;Cryotherapy;Energy conservation;Ultrasound   Plan reinforce PWr! prone and quadraped and issue as HEP, strategies to compensate for visual changes   OT Home Exercise Plan Education provided:  coordination HEP; PD Handwriting tips (08/04/15)   Consulted and Agree with Plan of Care Patient      Patient will benefit from skilled therapeutic intervention in order to improve the following deficits and impairments:  Decreased coordination, Decreased range of motion, Decreased safety awareness, Decreased endurance, Decreased activity tolerance, Impaired tone, Impaired UE functional use, Pain, Decreased knowledge of use of DME, Decreased balance, Decreased cognition, Decreased mobility, Decreased strength, Impaired vision/preception  Visit Diagnosis: Other symptoms and signs involving the musculoskeletal system  Other symptoms and signs involving the nervous system  Other lack of coordination  Unsteadiness on feet    Problem List Patient Active Problem List   Diagnosis Date Noted  . PSP (progressive supranuclear palsy) ([place]) 07/15/2015  . Aortic stenosis 06/09/2015  . Dysphasia 06/09/2015  . Aortic valve disorder 05/24/2014  . OSTEOPENIA 08/11/2009  . PEDAL EDEMA 07/05/2008  . BACK PAIN 12/17/2006  . Essential hypertension 11/21/2006  . Allergic rhinitis 11/21/2006  . Osteoporosis 11/21/2006  . History of colonic polyps 11/21/2006     RINE,KATHRYN 08/09/2015, 3:03 PM [name], OTR/L [phone] Phone: [phone] 3:03 PM 08/09/2015 [facility] [address] [place], [name], [address] Phone: [phone]   Fax:  [phone]  Name: Tiffany [name] MRN: [id number] Date of Birth: [birth year]

## 2015-08-10 NOTE — Therapy (Signed)
[facility] [address] [place], [name], [address] Phone: [phone]   Fax:  [phone]  Physical Therapy Treatment  Patient Details  Name: Tiffany [name] MRN: [id number] Date of Birth: 1932-07-25 Referring Provider: Alonza Bogus, DO  Encounter Date: 08/09/2015      PT End of Session - 08/09/15 1450    Visit Number 5   Number of Visits 17   Date for PT Re-Evaluation 09/17/15   Authorization Type [employer]   Authorization Time Period G Codes and progress note required every 10 visits   PT Start Time 1448   PT Stop Time 1530   PT Time Calculation (min) 42 min   Equipment Utilized During Treatment Gait belt   Activity Tolerance Patient tolerated treatment well   Behavior During Therapy [facility] for tasks assessed/performed      Past Medical History  Diagnosis Date  . ALLERGIC RHINITIS 11/21/2006  . BACK PAIN 12/17/2006  . COLONIC POLYPS, HX OF 11/21/2006  . HYPERTENSION 11/21/2006  . OSTEOPENIA 08/11/2009  . OSTEOPOROSIS 11/21/2006  . PEDAL EDEMA 07/05/2008  . SYSTOLIC MURMUR [id number]    Past Surgical History  Procedure Laterality Date  . Hemorrhoid surgery    . Tonsillectomy    . Breast surgery      bx  . Cataract extraction Bilateral 2012    There were no vitals filed for this visit.      Subjective Assessment - 08/09/15 1449    Subjective No new complaints. No more falls. No pain to report.   Pertinent History [name].  PMH significant for: progressive supranuclear palsy (diagnosed 07/15/15), HTN, osteopenia, aortic stenosis, bilateral cataract extraction   Patient Stated Goals "My goal is to walk better."   Currently in Pain? No/denies   Pain Score 0-No pain            [facility] Adult PT Treatment/Exercise - 08/09/15 1521    Transfers   Transfers Sit to Stand;Stand to Sit   Sit to Stand 5: Supervision;With upper extremity assist;From bed;From chair/3-in-1   Sit to Stand  Details Verbal cues for precautions/safety;Verbal cues for safe use of DME/AE   Stand to Sit 5: Supervision;With upper extremity assist;To bed;To chair/3-in-1   Stand to Sit Details (indicate cue type and reason) Verbal cues for precautions/safety;Verbal cues for safe use of DME/AE   Ambulation/Gait   Ambulation/Gait Yes   Ambulation/Gait Assistance 5: Supervision;4: Min guard   Ambulation Distance (Feet) 500 Feet  x1 indoor/outdoor, 660 x 1 indoor only   Assistive device Rollator   Gait Pattern Step-through pattern;Narrow base of support   Ambulation Surface Level;Unlevel;Indoor;Outdoor;Paved   Ramp 4: Min assist  outdoors, plus x 1 rep with indoor ramp   Curb 4: Min assist   Curb Details (indicate cue type and reason) x1 outdoors, x1 indoors        08/10/15 1646  Moves in Sitting  PWR! Up 20  PWR! Rock 20  PWR! Twist 20  PWR! Step 20  Comments cues on technique and form           PT Short Term Goals - 07/20/15 1644    PT SHORT TERM GOAL #1   Title Pt will perform initial HEP independently to maximize functional gains made in PT.   (Target date: 08/16/15)   PT SHORT TERM GOAL #2   Title Determine appropriate assistive device and request MD order, if necessary, to decrease fall risk.   PT SHORT TERM  GOAL #3   Title Pt will negotiate 3 stairs without rails with step-to pattern and mod I using LRAD to indicate pt safety using primary home entrance.   PT SHORT TERM GOAL #4   Title Pt will ambulate >300' over level, indoor surfaces with mod I using LRAD to indicate increased safety with household ambulation.    PT SHORT TERM GOAL #5   Title Pt will negotiate standard ramp and curb step with supervision using LRAD to indicate increased safety traversing commnuity obstacles.    PT SHORT TERM GOAL #6   Title Pt will improve FGA score from 13/30 to >/= 16/30 to indicate decreased fall risk.           PT Long Term Goals - 07/20/15 1655    PT LONG TERM GOAL #1   Title Pt  will independently perform HEP to indicate safe daily compliance with HEP.   (Target date: 09/13/15)   PT LONG TERM GOAL #2   Title Pt will improve FGA score from 13/30 to 19/30 to indicate significant improvement in dynamic gait stability.    PT LONG TERM GOAL #3   Title Pt will ambulate >500'' over unlevel, paved surfaces with mod I using LRAD to indicate increased safety with commnuity ambulation.    PT LONG TERM GOAL #4   Title Pt will negotiate standard ramp and curb step with mod I using LRAD to indicate increased independence traversing commnuity obstacles.    PT LONG TERM GOAL #5   Title Pt will verbalize plan for community exercise following episode of PT to indicate safe transition to community fitness regimen.            Plan - 08/09/15 1451    Clinical Impression Statement Continued to work on gait safety with rollator, including outside and barriers. Also revisited PWR! seated exercises. Did not issue them today due amount of cues needed to perform them correctly.    Rehab Potential Good   PT Frequency 2x / week   PT Duration 8 weeks   PT Treatment/Interventions ADLs/Self Care Home Management;DME Instruction;Gait training;Stair training;Neuromuscular re-education;Functional mobility training;Therapeutic activities;Therapeutic exercise;Balance training;Patient/family education   PT Next Visit Plan review seated PWR! exercises and issue if pt able to perform safely/with minimal cues;continue to train with rollator   Consulted and Agree with Plan of Care Patient      Patient will benefit from skilled therapeutic intervention in order to improve the following deficits and impairments:  Abnormal gait, Decreased balance, Decreased coordination, Postural dysfunction, Other (comment), Dizziness, Decreased knowledge of use of DME, Decreased safety awareness  Visit Diagnosis: Other lack of coordination  Unsteadiness on feet  Other abnormalities of gait and  mobility     Problem List Patient Active Problem List   Diagnosis Date Noted  . PSP (progressive supranuclear palsy) ([place]) 07/15/2015  . Aortic stenosis 06/09/2015  . Dysphasia 06/09/2015  . Aortic valve disorder 05/24/2014  . OSTEOPENIA 08/11/2009  . PEDAL EDEMA 07/05/2008  . BACK PAIN 12/17/2006  . Essential hypertension 11/21/2006  . Allergic rhinitis 11/21/2006  . Osteoporosis 11/21/2006  . History of colonic polyps 11/21/2006    Willow Ora, PTA, [facility] Outpatient Neuro [facility] [address], [place], [place] [address] [phone] 08/10/2015, 4:45 PM   Name: Tiffany [name] MRN: [id number] Date of Birth: Dec 01, 1932

## 2015-08-12 ENCOUNTER — Ambulatory Visit: Payer: Commercial Managed Care - HMO

## 2015-08-12 ENCOUNTER — Ambulatory Visit: Payer: Commercial Managed Care - HMO | Admitting: Occupational Therapy

## 2015-08-12 ENCOUNTER — Ambulatory Visit: Payer: Commercial Managed Care - HMO | Admitting: Physical Therapy

## 2015-08-12 ENCOUNTER — Telehealth: Payer: Self-pay | Admitting: Physical Therapy

## 2015-08-12 DIAGNOSIS — R2681 Unsteadiness on feet: Secondary | ICD-10-CM | POA: Diagnosis not present

## 2015-08-12 DIAGNOSIS — R4701 Aphasia: Secondary | ICD-10-CM

## 2015-08-12 DIAGNOSIS — R29898 Other symptoms and signs involving the musculoskeletal system: Secondary | ICD-10-CM

## 2015-08-12 DIAGNOSIS — R279 Unspecified lack of coordination: Secondary | ICD-10-CM | POA: Diagnosis not present

## 2015-08-12 DIAGNOSIS — R2689 Other abnormalities of gait and mobility: Secondary | ICD-10-CM

## 2015-08-12 DIAGNOSIS — R1310 Dysphagia, unspecified: Secondary | ICD-10-CM

## 2015-08-12 DIAGNOSIS — R471 Dysarthria and anarthria: Secondary | ICD-10-CM

## 2015-08-12 DIAGNOSIS — R29818 Other symptoms and signs involving the nervous system: Secondary | ICD-10-CM

## 2015-08-12 DIAGNOSIS — R278 Other lack of coordination: Secondary | ICD-10-CM | POA: Diagnosis not present

## 2015-08-12 MED ORDER — ROLLATOR MISC: 1.0000 | Freq: Every day | Status: DC

## 2015-08-12 NOTE — Patient Instructions (Signed)
Work on your homework 15-20 minutes, twice a day.

## 2015-08-12 NOTE — Therapy (Addendum)
[facility] [address] [place], [name], [address] Phone: [phone]   Fax:  [phone]  Occupational Therapy Treatment  Patient Details  Name: Tiffany [name] MRN: [id number] Date of Birth: September 20, 1932 Referring Provider: Dr. Carles Collet  Encounter Date: 08/12/2015      OT End of Session - 08/12/15 1453    Visit Number 7   Number of Visits 17   Date for OT Re-Evaluation 09/17/15   Authorization Type [employer]   Authorization - Visit Number 7   Authorization - Number of Visits 10   OT Start Time [id number]   OT Stop Time 1445   OT Time Calculation (min) 40 min      Past Medical History  Diagnosis Date  . ALLERGIC RHINITIS 11/21/2006  . BACK PAIN 12/17/2006  . COLONIC POLYPS, HX OF 11/21/2006  . HYPERTENSION 11/21/2006  . OSTEOPENIA 08/11/2009  . OSTEOPOROSIS 11/21/2006  . PEDAL EDEMA 07/05/2008  . SYSTOLIC MURMUR [id number]    Past Surgical History  Procedure Laterality Date  . Hemorrhoid surgery    . Tonsillectomy    . Breast surgery      bx  . Cataract extraction Bilateral 2012    There were no vitals filed for this visit.      Pt was issued neoprene thumb CMC splint for right hand due to injuring thumb in a previous fall. Pt practiced donnin/gdoffing splint x 3 and returned demonstration, handout issued. See pt instructions.                 PWR [facility]) - 08/12/15 1417    PWR! exercises Moves in prone   PWR! Up x10   PWR! Rock [employer]! Twist x10 each direction   PWR! Step 20 each direction, singel steps out   Comments mod -max v.c./ tactiile cues and demo             OT Education - 08/12/15 1455    Education provided Yes   Education Details PWR! modified quadraped, thumb CMC splint wear, care and precautions   Person(s) Educated Patient   Methods Explanation;Demonstration;Tactile cues;Verbal cues;Handout   Comprehension Verbalized understanding;Returned demonstration;Verbal  cues required          OT Short Term Goals - 07/19/15 1628    OT SHORT TERM GOAL #1   Title I with HEP. due 08/17/15   Time 4   Period Weeks   Status New   OT SHORT TERM GOAL #2   Title Pt will demonstrate improved fine motor coordination as evidednce by decreasing 9 hole peg test score to 45 secs or less.   Time 4   Period Weeks   Status New   OT SHORT TERM GOAL #3   Title Pt will verbalize understanding of adapted strategies for ADLs.   Time 4   Period Weeks   Status New   OT SHORT TERM GOAL #4   Title Pt will verbalize understanding of compensatory strategies for visual changes.   Time 4   Period Weeks   Status New   OT SHORT TERM GOAL #5   Title Pt will write a short paragraph with 100 % legibility and minimal decrease in letter size.   Time 4   Period Weeks   Status New           OT Long Term Goals - 07/19/15 1630    OT LONG TERM GOAL #1   Title Pt will demonstrate improved fine motor coordination  as evidenced by decreasing RUE 9 hole peg test score to 40 secs or less.- due 09/16/15   Baseline RUE 51.69, LUE 29.53 secs   Time 8   Period Weeks   Status New   OT LONG TERM GOAL #2   Title Pt will improve RUE standing functional reach to 10 " or greater for improved standing balance.   Baseline RUE 9", LUE 10"   Time 8   Period Weeks   Status New   OT LONG TERM GOAL #3   Title Pt will improve RUE box/ blocks score to 41 blocks.   Baseline baseline: RUE 38 blocks, LUE 43 blocks   Time 8   Period Weeks   Status New   OT LONG TERM GOAL #4   Title Pt will demonstrate improved ease with fasteners as evidenced  by performing 3 button/ unbutton in 26 secs or less.   Baseline 29.22 secs   Time 8   Period Weeks   Status New   OT LONG TERM GOAL #5   Title Pt will verbalize understanding of strategies to minimize freezing episodes   Time 8   Period Weeks   Status New               Plan - 08/12/15 1450    Clinical Impression Statement Pt is  progressing slowly towards goals yet she requires significant repetition of exercises for timing and reinforcement.   Rehab Potential Good   OT Frequency 2x / week   OT Duration 8 weeks   OT Treatment/Interventions Self-care/ADL training;Therapeutic exercise;Patient/family education;Balance training;Splinting;Manual Therapy;Neuromuscular education;Therapeutic exercises;Therapeutic activities;Fluidtherapy;Cognitive remediation/compensation;Visual/perceptual remediation/compensation;Passive range of motion;Moist Heat;Contrast Bath;DME and/or AE instruction;Parrafin;Cryotherapy;Energy conservation;Ultrasound   Plan reinforce PWR1 prone, check short term goals, modified quadraped PWR!   OT Home Exercise Plan Education provided:  coordination HEP; PD Handwriting tips (08/04/15) prone PWR!    Consulted and Agree with Plan of Care Patient      Patient will benefit from skilled therapeutic intervention in order to improve the following deficits and impairments:  Decreased coordination, Decreased range of motion, Decreased safety awareness, Decreased endurance, Decreased activity tolerance, Impaired tone, Impaired UE functional use, Pain, Decreased knowledge of use of DME, Decreased balance, Decreased cognition, Decreased mobility, Decreased strength, Impaired vision/preception  Visit Diagnosis: Other symptoms and signs involving the musculoskeletal system  Other symptoms and signs involving the nervous system  Other lack of coordination  Unsteadiness on feet    Problem List Patient Active Problem List   Diagnosis Date Noted  . PSP (progressive supranuclear palsy) ([place]) 07/15/2015  . Aortic stenosis 06/09/2015  . Dysphasia 06/09/2015  . Aortic valve disorder 05/24/2014  . OSTEOPENIA 08/11/2009  . PEDAL EDEMA 07/05/2008  . BACK PAIN 12/17/2006  . Essential hypertension 11/21/2006  . Allergic rhinitis 11/21/2006  . Osteoporosis 11/21/2006  . History of colonic polyps 11/21/2006     Tiffany [name] 08/12/2015, 3:03 PM Tiffany [name], OTR/L [phone] Phone: [phone] 3:03 PM 08/12/2015 [facility] [address] [place], [name], [address] Phone: [phone]   Fax:  [phone]  Name: Tiffany [name] MRN: [id number] Date of Birth: [birth year]

## 2015-08-12 NOTE — Patient Instructions (Signed)
Wear thumb brace intally for 2-3 hrs at a time, remove splint if if feels too tight, is cutting off circulation or is rubbing a pressure area. If no problems you can wear for most of the day. Remove to wash hands or bathe.  Make sure hand is dry before putting brace back on.

## 2015-08-12 NOTE — Therapy (Signed)
[facility] [address] [place], [name], [address] Phone: [phone]   Fax:  [phone]  Speech Language Pathology Treatment  Patient Details  Name: Tiffany [name] MRN: [id number] Date of Birth: 03-Nov-1932 Referring Provider: Bluford Kaufmann, M.D.  Encounter Date: 08/12/2015      End of Session - 08/12/15 1621    Visit Number 7   Number of Visits 16   Date for SLP Re-Evaluation 08/12/15   SLP Start Time 1532   SLP Stop Time  1606  pt wanted to leave early to beat traffic   SLP Time Calculation (min) 34 min   Activity Tolerance Patient tolerated treatment well      Past Medical History  Diagnosis Date  . ALLERGIC RHINITIS 11/21/2006  . BACK PAIN 12/17/2006  . COLONIC POLYPS, HX OF 11/21/2006  . HYPERTENSION 11/21/2006  . OSTEOPENIA 08/11/2009  . OSTEOPOROSIS 11/21/2006  . PEDAL EDEMA 07/05/2008  . SYSTOLIC MURMUR [id number]    Past Surgical History  Procedure Laterality Date  . Hemorrhoid surgery    . Tonsillectomy    . Breast surgery      bx  . Cataract extraction Bilateral 2012    There were no vitals filed for this visit.      Subjective Assessment - 08/12/15 1536    Subjective Pt performed homework intermittently - not as directed.   Currently in Pain? No/denies               ADULT SLP TREATMENT - 08/12/15 1537    General Information   Behavior/Cognition Alert;Cooperative;Pleasant mood   Treatment Provided   Treatment provided Cognitive-Linquistic   Cognitive-Linquistic Treatment   Treatment focused on Dysarthria   Skilled Treatment Pt reported to SLP she did homework once a day for 15 minutes, intermittent days since last ST. SLP told pt she must complete homework more consistently to benefit the most from [place]. Pt read 5-syllable words with 90% success with compensations. In structured tasks pt overarticulated with occasional min A. SLP encouraged pt to work on homework 15-20  minutes twicie a day.    Assessment / Recommendations / Plan   Plan Continue with current plan of care   Progression Toward Goals   Progression toward goals Progressing toward goals            SLP Short Term Goals - 08/09/15 1650    SLP SHORT TERM GOAL #1   Title pt will demo compensations for speech intelligibility in 10 minutes simple conversation for intelligiblity >95%   Time 1   Period Weeks   Status On-going   SLP SHORT TERM GOAL #2   Title pt will complete HEP for dysarthria with modified independence   Time 1   Period Weeks   Status On-going   SLP SHORT TERM GOAL #3   Title pt will demo functional verbal expression, using compensations, in 10 minutes simple conversation   Time 1   Period Weeks   Status On-going   SLP SHORT TERM GOAL #4   Title pt will demo appropriate tuck posture for chin down with thin liquids during sessions 95% of the time with rare min A   Time 1   Period Weeks   Status On-going          SLP Long Term Goals - 08/12/15 1625    SLP LONG TERM GOAL #1   Title pt will complete HEP for dysphagia with modified independence   Baseline all LTGs renewed for week  of 08-15-15   Time 4   Period Weeks   Status On-going   SLP LONG TERM GOAL #2   Title pt will demo compensations for speech intelligibility in 10 minutes simple to mod complex conversation for intelligiblity >95%   Time 4   Period Weeks   Status On-going   SLP LONG TERM GOAL #3   Title pt will demo functional verbal expression, using compensations, in 10 minutes mod complex conversation   Time 4   Period Weeks   Status On-going   SLP LONG TERM GOAL #4   Title pt will demo compensations for dysphagia in order to decr risk of aspiration 90% of the time with POs during 3 sessions   Time 4   Period Weeks   Status On-going          Plan - 08/12/15 1622    Clinical Impression Statement Pt's progress to date has mostly been with reading and with structured tasks. In conversation  she cont to require SLP cues for incr'd intelligibliity. She is doing better with following swallow precautions in the sessions. She would benefit from cont'd ST addressing intelligibliity and swallowing safety.   Speech Therapy Frequency 2x / week   Duration 4 weeks   Treatment/Interventions Aspiration precaution training;Pharyngeal strengthening exercises;Diet toleration management by SLP;SLP instruction and feedback;Compensatory strategies;Internal/external aids;Patient/family education;Functional tasks;Cueing hierarchy   Potential to Achieve Goals Fair   Potential Considerations Severity of impairments;Cooperation/participation level   Consulted and Agree with Plan of Care Patient      Patient will benefit from skilled therapeutic intervention in order to improve the following deficits and impairments:   Dysarthria and anarthria  Dysphagia  Aphasia    Problem List Patient Active Problem List   Diagnosis Date Noted  . PSP (progressive supranuclear palsy) ([place]) 07/15/2015  . Aortic stenosis 06/09/2015  . Dysphasia 06/09/2015  . Aortic valve disorder 05/24/2014  . OSTEOPENIA 08/11/2009  . PEDAL EDEMA 07/05/2008  . BACK PAIN 12/17/2006  . Essential hypertension 11/21/2006  . Allergic rhinitis 11/21/2006  . Osteoporosis 11/21/2006  . History of colonic polyps 11/21/2006    [facility] ,[place], [place]  08/12/2015, 4:29 PM  [facility] [address] [place], [name], [address] Phone: [phone]   Fax:  [phone]   Name: Tiffany [name] MRN: [id number] Date of Birth: [birth year]

## 2015-08-12 NOTE — Telephone Encounter (Signed)
Dr. Carles Collet,  Ms. Sachse has been gait training using a rollator during physical therapy sessions. Ms. Mcneer is much safer when ambulating with rollator as opposed to no assistive device. Pt would benefit from having a personal rollator to decrease fall risk.  If you agree, please place an order for a rollator.  Thanks so much,  Billie Ruddy, PT, DPT [facility] [address] [place], [name], [address] Phone: [phone]   Fax:  [phone] 08/12/2015, 3:06 PM

## 2015-08-12 NOTE — Telephone Encounter (Signed)
Order for rollator in EPIC.

## 2015-08-13 NOTE — Therapy (Signed)
[name] [address] [place], [place], [address] Phone: [phone]   Fax:  [phone]  Physical Therapy Treatment  Patient Details  Name: Tiffany [name] MRN: [id number] Date of Birth: Aug 03, 1932 Referring Provider: Kerin Salen, DO  Encounter Date: 08/12/2015      PT End of Session - 08/13/15 1509    Visit Number 6   Number of Visits 17   Date for PT Re-Evaluation 09/17/15   Authorization Type [employer]   Authorization Time Period G Codes and progress note required every 10 visits   PT Start Time 1447   PT Stop Time 1530   PT Time Calculation (min) 43 min   Equipment Utilized During Treatment Gait belt   Activity Tolerance Patient tolerated treatment well   Behavior During Therapy [facility] for tasks assessed/performed      Past Medical History  Diagnosis Date  . ALLERGIC RHINITIS 11/21/2006  . BACK PAIN 12/17/2006  . COLONIC POLYPS, HX OF 11/21/2006  . HYPERTENSION 11/21/2006  . OSTEOPENIA 08/11/2009  . OSTEOPOROSIS 11/21/2006  . PEDAL EDEMA 07/05/2008  . SYSTOLIC MURMUR 10/12/2008    Past Surgical History  Procedure Laterality Date  . Hemorrhoid surgery    . Tonsillectomy    . Breast surgery      bx  . Cataract extraction Bilateral 2012    There were no vitals filed for this visit.      Subjective Assessment - 08/13/15 1455    Subjective Pt reports no significant changes since last seen by PT. No falls.  Is open to using rollator and would be able to drive to medical equipment store to get rollator.    Pertinent History [name].  PMH significant for: progressive supranuclear palsy (diagnosed 07/15/15), HTN, osteopenia, aortic stenosis, bilateral cataract extraction   Patient Stated Goals "My goal is to walk better."   Currently in Pain? No/denies                            PWR [facility]) - 08/13/15 1503    PWR! exercises Moves in sitting   PWR! Up x15  Cueing  for hand placement, posture   PWR! Rock [employer] for technique, full lateral weight shift   PWR! Twist x15  cueing for endpoint of movement, full stop at each position   PWR! Step x15  cueing for high/wide LE movement   Comments Cueing for intentional, finite endpoint of movements (especially during PWRUp and PWRTwist), which did appear to increase pt awareness of movement and improved technique. Seated PWR! not yet given to pt for HEP; will continue to reinforce technique and provide in next 1-2 sessions.             PT Education - 08/13/15 1457    Education provided Yes   Education Details STG findings and progress. Reviewed seated PWR! exercises. Explained process of getting rollator after MD order received (plan for next session).    Person(s) Educated Patient   Methods Explanation;Demonstration;Handout;Verbal cues;Tactile cues   Comprehension Verbalized understanding;Returned demonstration          PT Short Term Goals - 08/12/15 1503    PT SHORT TERM GOAL #1   Title Pt will perform initial HEP independently to maximize functional gains made in PT.   (Target date: 08/16/15)   PT SHORT TERM GOAL #2   Title Determine appropriate assistive device and request MD order, if necessary,  to decrease fall risk.   Baseline Requested MD order for rollator on 5/19.   Status Achieved   PT SHORT TERM GOAL #3   Title Pt will negotiate 1 step without rails with step-to pattern and mod I using LRAD to indicate pt safety using primary home entrance.   Baseline 5/19: REVISED from 3 stairs to 1 step due to pt report of only 1 step to enter home.    Status Revised   PT SHORT TERM GOAL #4   Title Pt will ambulate >300' over level, indoor surfaces with mod I using LRAD to indicate increased safety with household ambulation.    PT SHORT TERM GOAL #5   Title Pt will negotiate standard ramp and curb step with supervision using LRAD to indicate increased safety traversing commnuity obstacles.     Baseline Met 5/19.   Status Achieved   PT SHORT TERM GOAL #6   Title Pt will improve FGA score from 13/30 to >/= 16/30 to indicate decreased fall risk.   Baseline Met 5/19 with FGA  of 16/30   Status Achieved           PT Long Term Goals - 07/20/15 1655    PT LONG TERM GOAL #1   Title Pt will independently perform HEP to indicate safe daily compliance with HEP.   (Target date: 09/13/15)   PT LONG TERM GOAL #2   Title Pt will improve FGA score from 13/30 to 19/30 to indicate significant improvement in dynamic gait stability.    PT LONG TERM GOAL #3   Title Pt will ambulate >500'' over unlevel, paved surfaces with mod I using LRAD to indicate increased safety with commnuity ambulation.    PT LONG TERM GOAL #4   Title Pt will negotiate standard ramp and curb step with mod I using LRAD to indicate increased independence traversing commnuity obstacles.    PT LONG TERM GOAL #5   Title Pt will verbalize plan for community exercise following episode of PT to indicate safe transition to community fitness regimen.               Plan - 08/13/15 1510    Clinical Impression Statement Session focused on beginning to assess STG's. Pt met STG's for community obstacle negotiation with supervision and for FGA score, which improved from 13/30 to 16/30. Reviewed seated PWR! moves; feel that pt will be able to effectively perform at home after reviewing 1-2 more times in PT. Plan to provide MD order for rollator at next session.   Rehab Potential Good   PT Frequency 2x / week   PT Duration 8 weeks   PT Treatment/Interventions ADLs/Self Care Home Management;DME Instruction;Gait training;Stair training;Neuromuscular re-education;Functional mobility training;Therapeutic activities;Therapeutic exercise;Balance training;Patient/family education   PT Next Visit Plan MD order for rollator received just after last session; provide order to pt and please give pt [facility] address. Make sure pt able to  safely put rollator into/take out of her car (as pt drives herself to PT). Review seated PWR! exercises and issue if pt able to perform safely/with minimal cues. If time, finish checking STG's.    Consulted and Agree with Plan of Care Patient      Patient will benefit from skilled therapeutic intervention in order to improve the following deficits and impairments:  Abnormal gait, Decreased balance, Decreased coordination, Postural dysfunction, Other (comment), Dizziness, Decreased knowledge of use of DME, Decreased safety awareness  Visit Diagnosis: Other abnormalities of gait and mobility  Unspecified lack  of coordination  Unsteadiness on feet     Problem List Patient Active Problem List   Diagnosis Date Noted  . PSP (progressive supranuclear palsy) ([place]) 07/15/2015  . Aortic stenosis 06/09/2015  . Dysphasia 06/09/2015  . Aortic valve disorder 05/24/2014  . OSTEOPENIA 08/11/2009  . PEDAL EDEMA 07/05/2008  . BACK PAIN 12/17/2006  . Essential hypertension 11/21/2006  . Allergic rhinitis 11/21/2006  . Osteoporosis 11/21/2006  . History of colonic polyps 11/21/2006    Billie Ruddy, PT, DPT [facility] [address] [place], [name], [address] Phone: [phone]   Fax:  [phone] 08/13/2015, 3:15 PM' Name: Tiffany [name] MRN: [id number] Date of Birth: 1933/02/02

## 2015-08-16 ENCOUNTER — Ambulatory Visit: Payer: Commercial Managed Care - HMO | Admitting: Physical Therapy

## 2015-08-16 ENCOUNTER — Encounter: Payer: Commercial Managed Care - HMO | Admitting: Speech Pathology

## 2015-08-16 ENCOUNTER — Encounter: Payer: Self-pay | Admitting: Physical Therapy

## 2015-08-16 ENCOUNTER — Ambulatory Visit: Payer: Commercial Managed Care - HMO | Admitting: Occupational Therapy

## 2015-08-16 ENCOUNTER — Encounter: Payer: Commercial Managed Care - HMO | Admitting: Occupational Therapy

## 2015-08-16 DIAGNOSIS — R2681 Unsteadiness on feet: Secondary | ICD-10-CM

## 2015-08-16 DIAGNOSIS — R279 Unspecified lack of coordination: Secondary | ICD-10-CM

## 2015-08-16 DIAGNOSIS — R29818 Other symptoms and signs involving the nervous system: Secondary | ICD-10-CM | POA: Diagnosis not present

## 2015-08-16 DIAGNOSIS — R29898 Other symptoms and signs involving the musculoskeletal system: Secondary | ICD-10-CM | POA: Diagnosis not present

## 2015-08-16 DIAGNOSIS — R4701 Aphasia: Secondary | ICD-10-CM | POA: Diagnosis not present

## 2015-08-16 DIAGNOSIS — R471 Dysarthria and anarthria: Secondary | ICD-10-CM | POA: Diagnosis not present

## 2015-08-16 DIAGNOSIS — R278 Other lack of coordination: Secondary | ICD-10-CM

## 2015-08-16 DIAGNOSIS — R2689 Other abnormalities of gait and mobility: Secondary | ICD-10-CM

## 2015-08-16 DIAGNOSIS — R1310 Dysphagia, unspecified: Secondary | ICD-10-CM | POA: Diagnosis not present

## 2015-08-16 NOTE — Therapy (Signed)
[facility] [address] [place], [name], [address] Phone: [phone]   Fax:  [phone]  Occupational Therapy Treatment  Patient Details  Name: Tiffany [name] MRN: [id number] Date of Birth: 1932-10-26 Referring Provider: Dr. Carles Collet  Encounter Date: 08/16/2015      OT End of Session - 08/16/15 1424    Visit Number 8   Number of Visits 17   Date for OT Re-Evaluation 09/17/15   Authorization Type [employer]   Authorization - Visit Number 8   Authorization - Number of Visits 10   OT Start Time [id number]   OT Stop Time 1445   OT Time Calculation (min) 42 min   Activity Tolerance Patient tolerated treatment well   Behavior During Therapy [facility] for tasks assessed/performed      Past Medical History  Diagnosis Date  . ALLERGIC RHINITIS 11/21/2006  . BACK PAIN 12/17/2006  . COLONIC POLYPS, HX OF 11/21/2006  . HYPERTENSION 11/21/2006  . OSTEOPENIA 08/11/2009  . OSTEOPOROSIS 11/21/2006  . PEDAL EDEMA 07/05/2008  . SYSTOLIC MURMUR [id number]    Past Surgical History  Procedure Laterality Date  . Hemorrhoid surgery    . Tonsillectomy    . Breast surgery      bx  . Cataract extraction Bilateral 2012    There were no vitals filed for this visit.      Subjective Assessment - 08/16/15 1406    Subjective  Denies pain   Pertinent History see Epic   Patient Stated Goals handwriting, increased ease with fasteners   Currently in Pain? No/denies               Bag exercises for simulated ADLS: simulating donning pants, pulling down shirt and donning shirt, min-mod v.c. Donning/ doffing jacket x 2 with adapted technique and improved performance, min v.c./ demonstration.          [facility]) - 08/16/15 1520                                               Prone   PWR! Up x10   PWR! Rock [employer]! Twist x20   PWR! Step x10   Comments Mod v. c. for sequencing, timing, form and technique. With therapist demo                OT Short Term Goals - 08/16/15 1416    OT SHORT TERM GOAL #1   Status On-going  needs reinforcement   OT SHORT TERM GOAL #2   Title Pt will demonstrate improved fine motor coordination as evidednce by decreasing 9 hole peg test score to 45 secs or less.   Time 4   Period Weeks   Status On-going   OT SHORT TERM GOAL #3   Title Pt will verbalize understanding of adapted strategies for ADLs.   Time 4   Period Weeks   Status On-going  needs reinforcement   OT SHORT TERM GOAL #4   Title Pt will verbalize understanding of compensatory strategies for visual changes.   Time 4   Period Weeks   Status On-going  needs reinforcement   OT SHORT TERM GOAL #5   Title Pt will write a short paragraph with 100 % legibility and minimal decrease in letter size.   Time 4   Period Weeks   Status On-going  OT Long Term Goals - 07/19/15 1630    OT LONG TERM GOAL #1   Title Pt will demonstrate improved fine motor coordination as evidenced by decreasing RUE 9 hole peg test score to 40 secs or less.- due 09/16/15   Baseline RUE 51.69, LUE 29.53 secs   Time 8   Period Weeks   Status New   OT LONG TERM GOAL #2   Title Pt will improve RUE standing functional reach to 10 " or greater for improved standing balance.   Baseline RUE 9", LUE 10"   Time 8   Period Weeks   Status New   OT LONG TERM GOAL #3   Title Pt will improve RUE box/ blocks score to 41 blocks.   Baseline baseline: RUE 38 blocks, LUE 43 blocks   Time 8   Period Weeks   Status New   OT LONG TERM GOAL #4   Title Pt will demonstrate improved ease with fasteners as evidenced  by performing 3 button/ unbutton in 26 secs or less.   Baseline 29.22 secs   Time 8   Period Weeks   Status New   OT LONG TERM GOAL #5   Title Pt will verbalize understanding of strategies to minimize freezing episodes   Time 8   Period Weeks   Status New               Plan - 08/16/15 1708    Clinical  Impression Statement Pt is progressing slowly towards goals limited by cognitive deficits requiring repetition of exercises for timing/ sequencing.   Rehab Potential Good   OT Frequency 2x / week   OT Duration 8 weeks   OT Treatment/Interventions Self-care/ADL training;Therapeutic exercise;Patient/family education;Balance training;Splinting;Manual Therapy;Neuromuscular education;Therapeutic exercises;Therapeutic activities;Fluidtherapy;Cognitive remediation/compensation;Visual/perceptual remediation/compensation;Passive range of motion;Moist Heat;Contrast Bath;DME and/or AE instruction;Parrafin;Cryotherapy;Energy conservation;Ultrasound   Plan check 9 hole peg test, reinforce coordination HEP, dynamic reaching in standing with trunk rotation at counter with pt following hand with eyes   Consulted and Agree with Plan of Care Patient      Patient will benefit from skilled therapeutic intervention in order to improve the following deficits and impairments:  Decreased coordination, Decreased range of motion, Decreased safety awareness, Decreased endurance, Decreased activity tolerance, Impaired tone, Impaired UE functional use, Pain, Decreased knowledge of use of DME, Decreased balance, Decreased cognition, Decreased mobility, Decreased strength, Impaired vision/preception  Visit Diagnosis: Other symptoms and signs involving the musculoskeletal system  Other symptoms and signs involving the nervous system  Other lack of coordination  Unsteadiness on feet    Problem List Patient Active Problem List   Diagnosis Date Noted  . PSP (progressive supranuclear palsy) ([place]) 07/15/2015  . Aortic stenosis 06/09/2015  . Dysphasia 06/09/2015  . Aortic valve disorder 05/24/2014  . OSTEOPENIA 08/11/2009  . PEDAL EDEMA 07/05/2008  . BACK PAIN 12/17/2006  . Essential hypertension 11/21/2006  . Allergic rhinitis 11/21/2006  . Osteoporosis 11/21/2006  . History of colonic polyps 11/21/2006     Tiffany [name] 08/16/2015, 5:11 PM [name], OTR/L [phone] Phone: [phone] 5:11 PM 08/16/2015 [name] [address] [place], [name], [address] Phone: [phone]   Fax:  [phone]  Name: Tiffany [name] MRN: [id number] Date of Birth: 07/30/1932

## 2015-08-17 ENCOUNTER — Ambulatory Visit: Payer: Commercial Managed Care - HMO | Admitting: Physical Therapy

## 2015-08-17 NOTE — Therapy (Signed)
[facility] [address] [place], [name], [address] Phone: [phone]   Fax:  [phone]  Physical Therapy Treatment  Patient Details  Name: Tiffany [name] MRN: [id number] Date of Birth: 19-Jan-1933 Referring Provider: Alonza Bogus, DO  Encounter Date: 08/16/2015      PT End of Session - 08/16/15 1450    Visit Number 7   Number of Visits 17   Date for PT Re-Evaluation 09/17/15   Authorization Type [employer]   Authorization Time Period G Codes and progress note required every 10 visits   PT Start Time 5498   PT Stop Time 1529   PT Time Calculation (min) 44 min   Equipment Utilized During Treatment Gait belt   Activity Tolerance Patient tolerated treatment well   Behavior During Therapy [facility] for tasks assessed/performed      Past Medical History  Diagnosis Date  . ALLERGIC RHINITIS 11/21/2006  . BACK PAIN 12/17/2006  . COLONIC POLYPS, HX OF 11/21/2006  . HYPERTENSION 11/21/2006  . OSTEOPENIA 08/11/2009  . OSTEOPOROSIS 11/21/2006  . PEDAL EDEMA 07/05/2008  . SYSTOLIC MURMUR 2/64/1583    Past Surgical History  Procedure Laterality Date  . Hemorrhoid surgery    . Tonsillectomy    . Breast surgery      bx  . Cataract extraction Bilateral 2012    There were no vitals filed for this visit.      Subjective Assessment - 08/16/15 1450    Subjective No new complaints. No falls to report.    Pertinent History [name].  PMH significant for: progressive supranuclear palsy (diagnosed 07/15/15), HTN, osteopenia, aortic stenosis, bilateral cataract extraction   Patient Stated Goals "My goal is to walk better."   Currently in Pain? No/denies   Pain Score 0-No pain            [facility] Adult PT Treatment/Exercise - 08/16/15 1510    Transfers   Transfers Sit to Stand;Stand to Sit   Sit to Stand 5: Supervision;With upper extremity assist;From bed;From chair/3-in-1   Sit to Stand Details Verbal  cues for safe use of DME/AE;Verbal cues for precautions/safety   Stand to Sit 5: Supervision;With upper extremity assist;To bed;To chair/3-in-1   Stand to Sit Details (indicate cue type and reason) Verbal cues for safe use of DME/AE;Verbal cues for precautions/safety   Ambulation/Gait   Ambulation/Gait Yes   Ambulation/Gait Assistance 5: Supervision   Ambulation/Gait Assistance Details occasional cues on rollator proximity with gait, especially with turns.   Ambulation Distance (Feet) 440 Feet   Assistive device Rollator   Gait Pattern Step-through pattern;Narrow base of support;Scissoring   Ambulation Surface Level;Indoor   Stairs Yes   Stairs Assistance 5: Supervision   Stairs Assistance Details (indicate cue type and reason) up/down single step using door frame with supervision. set this up to simulate pt's home setup.   Stair Management Technique Forwards;Other (comment)  used door frame as she does at home   Number of Stairs 1   Ramp 5: Supervision   Ramp Details (indicate cue type and reason) x 3 reps with rollator, occasional cues on rollator proximity and brake use   Curb 5: Supervision   Curb Details (indicate cue type and reason) x 3 reps with rollator on indoor  curb, cues on sequencing  and staggered stance position when advancing rollator for increased stability  [facility]) - 08/16/15 1520    PWR! Up x10   PWR! Rock x10   PWR! Twist x10   PWR! Step x10   Comments pt with good technique with PWR! Up/Rock, cues needed with other 2 on form and technique.               PT Short Term Goals - 08/16/15 1505    PT SHORT TERM GOAL #1   Title Pt will perform initial HEP independently to maximize functional gains made in PT.   (Target date: 08/16/15)   Baseline 08/16/15: with HEP issued to date   Status Achieved   PT SHORT TERM GOAL #2   Title Determine appropriate assistive device and request MD order, if necessary, to decrease fall risk.    Baseline Requested MD order for rollator on 5/19.   Status Achieved   PT SHORT TERM GOAL #3   Title Pt will negotiate 1 step without rails with step-to pattern and mod I using LRAD to indicate pt safety using primary home entrance.   Baseline met on 08/16/15   Status Revised   PT SHORT TERM GOAL #4   Title Pt will ambulate >300' over level, indoor surfaces with mod I using LRAD to indicate increased safety with household ambulation.    Baseline met on 08/16/15   Status Achieved   PT SHORT TERM GOAL #5   Title Pt will negotiate standard ramp and curb step with supervision using LRAD to indicate increased safety traversing commnuity obstacles.    Baseline Met 5/19.   Status Achieved   PT SHORT TERM GOAL #6   Title Pt will improve FGA score from 13/30 to >/= 16/30 to indicate decreased fall risk.   Baseline Met 5/19 with FGA  of 16/30   Status Achieved           PT Long Term Goals - 07/20/15 1655    PT LONG TERM GOAL #1   Title Pt will independently perform HEP to indicate safe daily compliance with HEP.   (Target date: 09/13/15)   PT LONG TERM GOAL #2   Title Pt will improve FGA score from 13/30 to 19/30 to indicate significant improvement in dynamic gait stability.    PT LONG TERM GOAL #3   Title Pt will ambulate >500'' over unlevel, paved surfaces with mod I using LRAD to indicate increased safety with commnuity ambulation.    PT LONG TERM GOAL #4   Title Pt will negotiate standard ramp and curb step with mod I using LRAD to indicate increased independence traversing commnuity obstacles.    PT LONG TERM GOAL #5   Title Pt will verbalize plan for community exercise following episode of PT to indicate safe transition to community fitness regimen.            Plan - 08/16/15 1451    Clinical Impression Statement Remainder of STGs met today. Pt was able to demo seated PWR! exercises with less cues needed on 2 of them today. Will plan to issue them for home in next session or 2.  Order for rollator issued to pt today as well.   Rehab Potential Good   PT Frequency 2x / week   PT Duration 8 weeks   PT Treatment/Interventions ADLs/Self Care Home Management;DME Instruction;Gait training;Stair training;Neuromuscular re-education;Functional mobility training;Therapeutic activities;Therapeutic exercise;Balance training;Patient/family education   PT Next Visit Plan  Make sure pt able to safely put rollator into/take out of her car (as pt drives herself to PT)  as weather permits to go outside. Review seated PWR! exercises and issue if pt able to perform safely/with minimal cues issue for home.    Consulted and Agree with Plan of Care Patient      Patient will benefit from skilled therapeutic intervention in order to improve the following deficits and impairments:  Abnormal gait, Decreased balance, Decreased coordination, Postural dysfunction, Other (comment), Dizziness, Decreased knowledge of use of DME, Decreased safety awareness  Visit Diagnosis: Unsteadiness on feet  Other abnormalities of gait and mobility  Unspecified lack of coordination     Problem List Patient Active Problem List   Diagnosis Date Noted  . PSP (progressive supranuclear palsy) ([place]) 07/15/2015  . Aortic stenosis 06/09/2015  . Dysphasia 06/09/2015  . Aortic valve disorder 05/24/2014  . OSTEOPENIA 08/11/2009  . PEDAL EDEMA 07/05/2008  . BACK PAIN 12/17/2006  . Essential hypertension 11/21/2006  . Allergic rhinitis 11/21/2006  . Osteoporosis 11/21/2006  . History of colonic polyps 11/21/2006   Willow Ora, PTA, [facility] [address] [place], [place] [address] [phone] 08/17/2015, 1:17 PM   Name: Tiffany [name] MRN: [id number] Date of Birth: November 09, 1932

## 2015-08-18 ENCOUNTER — Ambulatory Visit: Payer: Commercial Managed Care - HMO | Admitting: Speech Pathology

## 2015-08-18 ENCOUNTER — Ambulatory Visit: Payer: Commercial Managed Care - HMO | Admitting: Occupational Therapy

## 2015-08-18 ENCOUNTER — Ambulatory Visit: Payer: Commercial Managed Care - HMO | Admitting: Physical Therapy

## 2015-08-18 ENCOUNTER — Telehealth: Payer: Self-pay | Admitting: Neurology

## 2015-08-18 DIAGNOSIS — R29818 Other symptoms and signs involving the nervous system: Secondary | ICD-10-CM

## 2015-08-18 DIAGNOSIS — R471 Dysarthria and anarthria: Secondary | ICD-10-CM | POA: Diagnosis not present

## 2015-08-18 DIAGNOSIS — R1310 Dysphagia, unspecified: Secondary | ICD-10-CM | POA: Diagnosis not present

## 2015-08-18 DIAGNOSIS — R279 Unspecified lack of coordination: Secondary | ICD-10-CM | POA: Diagnosis not present

## 2015-08-18 DIAGNOSIS — R278 Other lack of coordination: Secondary | ICD-10-CM | POA: Diagnosis not present

## 2015-08-18 DIAGNOSIS — R4701 Aphasia: Secondary | ICD-10-CM | POA: Diagnosis not present

## 2015-08-18 DIAGNOSIS — R2689 Other abnormalities of gait and mobility: Secondary | ICD-10-CM | POA: Diagnosis not present

## 2015-08-18 DIAGNOSIS — R2681 Unsteadiness on feet: Secondary | ICD-10-CM | POA: Diagnosis not present

## 2015-08-18 DIAGNOSIS — R29898 Other symptoms and signs involving the musculoskeletal system: Secondary | ICD-10-CM | POA: Diagnosis not present

## 2015-08-18 MED ORDER — CARBIDOPA-LEVODOPA 25-100 MG PO TABS: 1.0000 | ORAL_TABLET | Freq: Three times a day (TID) | ORAL | Status: DC

## 2015-08-18 NOTE — Telephone Encounter (Signed)
Spoke with patient - 2 weeks of medication sent to local pharmacy then RX sent to [facility].

## 2015-08-18 NOTE — Telephone Encounter (Signed)
Left message on machine for patient to call back. To see if she wants meds sent to [facility] or [facility].

## 2015-08-18 NOTE — Telephone Encounter (Signed)
Tiffany [name] July 11, 2032. She called in needing a refill on her Carbidopa Levodopa 25-100 mg. She uses [facility] [address]. Her number is [phone]. She was also saying something regarding [employer] and 90 day supply? Thank you

## 2015-08-18 NOTE — Therapy (Signed)
Tiffany [name] [address] [place], [name], [address] Phone: [phone]   Fax:  [phone]  Speech Language Pathology Treatment  Patient Details  Name: Tiffany [name] MRN: [id number] Date of Birth: 1932/04/25 Referring Provider: Bluford Kaufmann, M.D.  Encounter Date: 08/18/2015      End of Session - 08/18/15 1400    Visit Number 8   Number of Visits 16   Date for SLP Re-Evaluation 08/12/15   Authorization Type [employer]   SLP Start Time [id number]   SLP Stop Time  1400   SLP Time Calculation (min) 43 min      Past Medical History  Diagnosis Date  . ALLERGIC RHINITIS 11/21/2006  . BACK PAIN 12/17/2006  . COLONIC POLYPS, HX OF 11/21/2006  . HYPERTENSION 11/21/2006  . OSTEOPENIA 08/11/2009  . OSTEOPOROSIS 11/21/2006  . PEDAL EDEMA 07/05/2008  . SYSTOLIC MURMUR [id number]    Past Surgical History  Procedure Laterality Date  . Hemorrhoid surgery    . Tonsillectomy    . Breast surgery      bx  . Cataract extraction Bilateral 2012    There were no vitals filed for this visit.      Subjective Assessment - 08/18/15 1325    Subjective "I forgot the homework - I can't explain it"               ADULT SLP TREATMENT - 08/18/15 1325    General Information   Behavior/Cognition Alert;Cooperative;Pleasant mood   Treatment Provided   Treatment provided Cognitive-Linquistic   Dysphagia Treatment   Temperature Spikes Noted No   Pain Assessment   Pain Assessment No/denies pain   Cognitive-Linquistic Treatment   Treatment focused on Dysarthria   Skilled Treatment Facilitated compensations for dysarthria with similarities  and differences with rare min A for overarticulation and slow rate with higher cognitive load. Simple conversation with occasional min A for dysarthria compensations - pt with word finding difficulty affecting utterance length - mod questioning cues and semantic cues for word finding and  encouragement to use longer, more detailed utterances.    Assessment / Recommendations / Plan   Plan Continue with current plan of care          SLP Education - 08/18/15 1355    Education provided Yes   Education Details compensations for word finding    Person(s) Educated Patient   Methods Explanation;Demonstration   Comprehension Verbalized understanding;Returned demonstration          SLP Short Term Goals - 08/18/15 1359    SLP SHORT TERM GOAL #1   Title pt will demo compensations for speech intelligibility in 10 minutes simple conversation for intelligiblity >95%   Time 1   Period Weeks   Status On-going   SLP SHORT TERM GOAL #2   Title pt will complete HEP for dysarthria with modified independence   Time 1   Period Weeks   Status On-going   SLP SHORT TERM GOAL #3   Title pt will demo functional verbal expression, using compensations, in 10 minutes simple conversation   Time 1   Period Weeks   Status On-going   SLP SHORT TERM GOAL #4   Title pt will demo appropriate tuck posture for chin down with thin liquids during sessions 95% of the time with rare min A   Time 1   Period Weeks   Status On-going          SLP Long Term Goals - 08/18/15 1359  SLP LONG TERM GOAL #1   Title pt will complete HEP for dysphagia with modified independence   Baseline all LTGs renewed for week of 08-15-15   Time 4   Period Weeks   Status On-going   SLP LONG TERM GOAL #2   Title pt will demo compensations for speech intelligibility in 10 minutes simple to mod complex conversation for intelligiblity >95%   Time 4   Period Weeks   Status On-going   SLP LONG TERM GOAL #3   Title pt will demo functional verbal expression, using compensations, in 10 minutes mod complex conversation   Time 4   Period Weeks   Status On-going   SLP LONG TERM GOAL #4   Title pt will demo compensations for dysphagia in order to decr risk of aspiration 90% of the time with POs during 3 sessions    Time 4   Period Weeks   Status On-going          Plan - 08/18/15 1357    Clinical Impression Statement Pt required min cues to carryover compensations for dysarthria    Speech Therapy Frequency 2x / week   Duration 4 weeks   Treatment/Interventions Aspiration precaution training;Pharyngeal strengthening exercises;Diet toleration management by SLP;SLP instruction and feedback;Compensatory strategies;Internal/external aids;Patient/family education;Functional tasks;Cueing hierarchy   Potential to Achieve Goals Fair   Potential Considerations Severity of impairments;Cooperation/participation level      Patient will benefit from skilled therapeutic intervention in order to improve the following deficits and impairments:   Dysarthria and anarthria    Problem List Patient Active Problem List   Diagnosis Date Noted  . PSP (progressive supranuclear palsy) ([place]) 07/15/2015  . Aortic stenosis 06/09/2015  . Dysphasia 06/09/2015  . Aortic valve disorder 05/24/2014  . OSTEOPENIA 08/11/2009  . PEDAL EDEMA 07/05/2008  . BACK PAIN 12/17/2006  . Essential hypertension 11/21/2006  . Allergic rhinitis 11/21/2006  . Osteoporosis 11/21/2006  . History of colonic polyps 11/21/2006    Huston Stonehocker, Annye Rusk MS, CCC-SLP 08/18/2015, 2:01 PM  [facility] [address] [place], [name], [address] Phone: [phone]   Fax:  [phone]   Name: Tiffany [name] MRN: [id number] Date of Birth: May 01, 1932

## 2015-08-18 NOTE — Therapy (Signed)
[facility] [address] [place], [name], [address] Phone: [phone]   Fax:  [phone]  Occupational Therapy Treatment  Patient Details  Name: Tiffany [name] MRN: [id number] Date of Birth: 12-12-1932 Referring Provider: Dr. Carles Collet  Encounter Date: 08/18/2015      OT End of Session - 08/18/15 1649    Visit Number 9   Number of Visits 17   Date for OT Re-Evaluation 09/17/15   Authorization Type [employer]   Authorization - Visit Number 9   Authorization - Number of Visits 10   OT Start Time 6237   OT Stop Time 1525   OT Time Calculation (min) 40 min   Activity Tolerance Patient tolerated treatment well      Past Medical History  Diagnosis Date  . ALLERGIC RHINITIS 11/21/2006  . BACK PAIN 12/17/2006  . COLONIC POLYPS, HX OF 11/21/2006  . HYPERTENSION 11/21/2006  . OSTEOPENIA 08/11/2009  . OSTEOPOROSIS 11/21/2006  . PEDAL EDEMA 07/05/2008  . SYSTOLIC MURMUR 09/20/3149    Past Surgical History  Procedure Laterality Date  . Hemorrhoid surgery    . Tonsillectomy    . Breast surgery      bx  . Cataract extraction Bilateral 2012    There were no vitals filed for this visit.      Subjective Assessment - 08/18/15 1454    Pertinent History see Epic   Patient Stated Goals handwriting, increased ease with fasteners   Currently in Pain? No/denies            [facility] OT Assessment - 08/18/15 0001    Coordination   Right 9 Hole Peg Test 47.44 sec.                   OT Treatments/Exercises (OP) - 08/18/15 0001    Fine Motor Coordination   Other Fine Motor Exercises Reviewed/reinforced coordination HEP - pt demo each, but still required mod v.c's to perform correctly.    Other Fine Motor Exercises Reviewed all handwriting strategies x 3 - still requires reinforcement. Pt only able to verbalize 1 hand writing strategy after review.    Neurological Re-education Exercises   Other Exercises 1 Pt  performing dynamic reaching activities in standing with trunk rotation bilaterally, reaching RUE and LUE; and focus on large amplitude movements while following hand with eyes/head turns. Pt required CGA using gait belt and slight LOB t/o activity.    Other Exercises 2 Seated: worked on large amplitude arm movements with trunk rotation bilaterally and following hands with big head turns                OT Education - 08/18/15 1648    Education provided Yes   Education Details Reinforcement/review of handwriting strategies and coordination HEP    Person(s) Educated Patient   Methods Explanation;Demonstration   Comprehension Returned demonstration;Verbal cues required;Need further instruction          OT Short Term Goals - 08/18/15 1650    OT SHORT TERM GOAL #1   Title I with HEP. due 08/17/15   Status On-going  needs reinforcement   OT SHORT TERM GOAL #2   Title Pt will demonstrate improved fine motor coordination as evidednce by decreasing 9 hole peg test score to 45 secs or less.   Time 4   Period Weeks   Status Not Met  47.44 sec   OT SHORT TERM GOAL #3   Title Pt will verbalize understanding of adapted strategies  for ADLs.   Time 4   Period Weeks   Status On-going  needs reinforcement   OT SHORT TERM GOAL #4   Title Pt will verbalize understanding of compensatory strategies for visual changes.   Time 4   Period Weeks   Status On-going  needs reinforcement   OT SHORT TERM GOAL #5   Title Pt will write a short paragraph with 100 % legibility and minimal decrease in letter size.   Time 4   Period Weeks   Status On-going           OT Long Term Goals - 07/19/15 1630    OT LONG TERM GOAL #1   Title Pt will demonstrate improved fine motor coordination as evidenced by decreasing RUE 9 hole peg test score to 40 secs or less.- due 09/16/15   Baseline RUE 51.69, LUE 29.53 secs   Time 8   Period Weeks   Status New   OT LONG TERM GOAL #2   Title Pt will improve  RUE standing functional reach to 10 " or greater for improved standing balance.   Baseline RUE 9", LUE 10"   Time 8   Period Weeks   Status New   OT LONG TERM GOAL #3   Title Pt will improve RUE box/ blocks score to 41 blocks.   Baseline baseline: RUE 38 blocks, LUE 43 blocks   Time 8   Period Weeks   Status New   OT LONG TERM GOAL #4   Title Pt will demonstrate improved ease with fasteners as evidenced  by performing 3 button/ unbutton in 26 secs or less.   Baseline 29.22 secs   Time 8   Period Weeks   Status New   OT LONG TERM GOAL #5   Title Pt will verbalize understanding of strategies to minimize freezing episodes   Time 8   Period Weeks   Status New               Plan - 08/18/15 1651    Clinical Impression Statement Pt with slightly improved coordination RUE. Pt continues to require reinforcement of education and ex's d/t cognitive deficits.    Rehab Potential Good   OT Frequency 2x / week   OT Duration 8 weeks   OT Treatment/Interventions Self-care/ADL training;Therapeutic exercise;Patient/family education;Balance training;Splinting;Manual Therapy;Neuromuscular education;Therapeutic exercises;Therapeutic activities;Fluidtherapy;Cognitive remediation/compensation;Visual/perceptual remediation/compensation;Passive range of motion;Moist Heat;Contrast Bath;DME and/or AE instruction;Parrafin;Cryotherapy;Energy conservation;Ultrasound   Plan G-code! assess STG #5, continued reinforcement for coordination and HEP's   OT Home Exercise Plan Education provided:  coordination HEP; PD Handwriting tips (08/04/15) prone PWR!    Consulted and Agree with Plan of Care Patient      Patient will benefit from skilled therapeutic intervention in order to improve the following deficits and impairments:  Decreased coordination, Decreased range of motion, Decreased safety awareness, Decreased endurance, Decreased activity tolerance, Impaired tone, Impaired UE functional use, Pain, Decreased  knowledge of use of DME, Decreased balance, Decreased cognition, Decreased mobility, Decreased strength, Impaired vision/preception  Visit Diagnosis: Other symptoms and signs involving the nervous system  Other lack of coordination    Problem List Patient Active Problem List   Diagnosis Date Noted  . PSP (progressive supranuclear palsy) (H[facility]) 07/15/2015  . Aortic stenosis 06/09/2015  . Dysphasia 06/09/2015  . Aortic valve disorder 05/24/2014  . OSTEOPENIA 08/11/2009  . PEDAL EDEMA 07/05/2008  . BACK PAIN 12/17/2006  . Essential hypertension 11/21/2006  . Allergic rhinitis 11/21/2006  . Osteoporosis 11/21/2006  . History of  colonic polyps 11/21/2006    Carey Bullocks, OTR/L 08/18/2015, 4:53 PM  [facility] [address] [place], [name], [address] Phone: [phone]   Fax:  [phone]  Name: VENICE MARCUCCI MRN: [id number] Date of Birth: 08/05/1932

## 2015-08-23 DIAGNOSIS — R269 Unspecified abnormalities of gait and mobility: Secondary | ICD-10-CM | POA: Diagnosis not present

## 2015-08-24 ENCOUNTER — Ambulatory Visit: Payer: Commercial Managed Care - HMO | Admitting: Physical Therapy

## 2015-08-24 ENCOUNTER — Ambulatory Visit: Payer: Commercial Managed Care - HMO

## 2015-08-24 ENCOUNTER — Encounter: Payer: Self-pay | Admitting: Physical Therapy

## 2015-08-24 ENCOUNTER — Ambulatory Visit: Payer: Commercial Managed Care - HMO | Admitting: Occupational Therapy

## 2015-08-24 DIAGNOSIS — R4701 Aphasia: Secondary | ICD-10-CM

## 2015-08-24 DIAGNOSIS — R29818 Other symptoms and signs involving the nervous system: Secondary | ICD-10-CM

## 2015-08-24 DIAGNOSIS — R2681 Unsteadiness on feet: Secondary | ICD-10-CM | POA: Diagnosis not present

## 2015-08-24 DIAGNOSIS — R1310 Dysphagia, unspecified: Secondary | ICD-10-CM | POA: Diagnosis not present

## 2015-08-24 DIAGNOSIS — R278 Other lack of coordination: Secondary | ICD-10-CM | POA: Diagnosis not present

## 2015-08-24 DIAGNOSIS — R279 Unspecified lack of coordination: Secondary | ICD-10-CM

## 2015-08-24 DIAGNOSIS — R471 Dysarthria and anarthria: Secondary | ICD-10-CM | POA: Diagnosis not present

## 2015-08-24 DIAGNOSIS — R2689 Other abnormalities of gait and mobility: Secondary | ICD-10-CM | POA: Diagnosis not present

## 2015-08-24 DIAGNOSIS — R29898 Other symptoms and signs involving the musculoskeletal system: Secondary | ICD-10-CM

## 2015-08-24 NOTE — Patient Instructions (Signed)
MAKE SURE YOU PUT YOUR CHIN DOWN WHEN YOU [place]!!

## 2015-08-24 NOTE — Therapy (Signed)
[facility] [address] [place], [name], [address] Phone: [phone]   Fax:  [phone]  Occupational Therapy Treatment  Patient Details  Name: Tiffany [name] MRN: [id number] Date of Birth: 1932/11/15 Referring Provider: Dr. Carles Collet  Encounter Date: 08/24/2015      OT End of Session - 08/24/15 1525    Visit Number 10   Number of Visits 17   Date for OT Re-Evaluation 09/17/15   Authorization Type [employer]   Authorization - Visit Number 10   Authorization - Number of Visits 20   OT Start Time 9326   OT Stop Time 1530   OT Time Calculation (min) 42 min   Activity Tolerance Patient tolerated treatment well   Behavior During Therapy [facility] for tasks assessed/performed      Past Medical History  Diagnosis Date  . ALLERGIC RHINITIS 11/21/2006  . BACK PAIN 12/17/2006  . COLONIC POLYPS, HX OF 11/21/2006  . HYPERTENSION 11/21/2006  . OSTEOPENIA 08/11/2009  . OSTEOPOROSIS 11/21/2006  . PEDAL EDEMA 07/05/2008  . SYSTOLIC MURMUR 10/05/4578    Past Surgical History  Procedure Laterality Date  . Hemorrhoid surgery    . Tonsillectomy    . Breast surgery      bx  . Cataract extraction Bilateral 2012    There were no vitals filed for this visit.              Handwriting with min v.c. for letter size and legibility.   Pt demonstrates ability to wrote 3 sentences without significant decrease in letter size. Discussion with pt regarding the visual changes associated with PSP and decreased reaction time and the recommendation that she may want to consider alternate transportation (rather than driving herself). Pt reports he brother brought her today and her friend can bring her in the future.       [facility]) - 08/24/15 1526    PWR! exercises Moves in [place]! Up x10   PWR! [employer]! Twist x20   PWR! Step x20   Comments Pt performed in modified quadraped with demonstration and mod v.c. for  larger amplitude movments               OT Short Term Goals - 08/24/15 1524    OT SHORT TERM GOAL #1   Title I with HEP. due 08/17/15   Status On-going  needs reinforcement   OT SHORT TERM GOAL #2   Title Pt will demonstrate improved fine motor coordination as evidednce by decreasing 9 hole peg test score to 45 secs or less.   Time 4   Period Weeks   Status Not Met  47.44 sec   OT SHORT TERM GOAL #3   Title Pt will verbalize understanding of adapted strategies for ADLs.   Time 4   Period Weeks   Status On-going  needs reinforcement   OT SHORT TERM GOAL #4   Title Pt will verbalize understanding of compensatory strategies for visual changes.   Time 4   Period Weeks   Status On-going  needs reinforcement   OT SHORT TERM GOAL #5   Title Pt will write a short paragraph with 100 % legibility and minimal decrease in letter size.   Time 4   Period Weeks   Status Achieved           OT Long Term Goals - 07/19/15 1630    OT LONG TERM GOAL #1   Title Pt  will demonstrate improved fine motor coordination as evidenced by decreasing RUE 9 hole peg test score to 40 secs or less.- due 09/16/15   Baseline RUE 51.69, LUE 29.53 secs   Time 8   Period Weeks   Status New   OT LONG TERM GOAL #2   Title Pt will improve RUE standing functional reach to 10 " or greater for improved standing balance.   Baseline RUE 9", LUE 10"   Time 8   Period Weeks   Status New   OT LONG TERM GOAL #3   Title Pt will improve RUE box/ blocks score to 41 blocks.   Baseline baseline: RUE 38 blocks, LUE 43 blocks   Time 8   Period Weeks   Status New   OT LONG TERM GOAL #4   Title Pt will demonstrate improved ease with fasteners as evidenced  by performing 3 button/ unbutton in 26 secs or less.   Baseline 29.22 secs   Time 8   Period Weeks   Status New   OT LONG TERM GOAL #5   Title Pt will verbalize understanding of strategies to minimize freezing episodes   Time 8   Period Weeks   Status  New               Plan - Sep 04, 2015 1736    Clinical Impression Statement Pt is progressing slowly towards goals due to cognitive deficits which require reinforcement and repetition for carryover. Pt can benefit from skilled occupational therapy to address: decreased coordination, visual perceptual changes cognitive deficits, rigidity, decreased balance in order to maximize safety and independence with ADLs/ IADLs.   Rehab Potential Good   OT Frequency 2x / week   OT Duration 8 weeks   OT Treatment/Interventions Self-care/ADL training;Therapeutic exercise;Patient/family education;Balance training;Splinting;Manual Therapy;Neuromuscular education;Therapeutic exercises;Therapeutic activities;Fluidtherapy;Cognitive remediation/compensation;Visual/perceptual remediation/compensation;Passive range of motion;Moist Heat;Contrast Bath;DME and/or AE instruction;Parrafin;Cryotherapy;Energy conservation;Ultrasound   Plan coordination, reinforce modified quadraped PWR!    OT Home Exercise Plan Education provided:  coordination HEP; PD Handwriting tips (08/04/15) prone PWR!    Consulted and Agree with Plan of Care Patient      Patient will benefit from skilled therapeutic intervention in order to improve the following deficits and impairments:  Decreased coordination, Decreased range of motion, Decreased safety awareness, Decreased endurance, Decreased activity tolerance, Impaired tone, Impaired UE functional use, Pain, Decreased knowledge of use of DME, Decreased balance, Decreased cognition, Decreased mobility, Decreased strength, Impaired vision/preception  Visit Diagnosis: Other symptoms and signs involving the nervous system  Unsteadiness on feet  Other abnormalities of gait and mobility  Other symptoms and signs involving the musculoskeletal system  Other lack of coordination      G-Codes - Sep 04, 2015 1528    Functional Assessment Tool Used 9 hole peg test: RUE 47.44 secs, Pt is able to  write a short parapgraph with min decrease in letter size and good legibility.   Functional Limitation Self care   Self Care Current Status [phone]) At least 20 percent but less than 40 percent impaired, limited or restricted   Self Care Goal Status (A2633) At least 1 percent but less than 20 percent impaired, limited or restricted    Occupational Therapy Progress Note  Dates of Reporting Period: 07/19/15 to 2015/09/04  Objective Reports of Subjective Statement: see above  Objective Measurements:  See above  Goal Update: Pt is progressing towards short term goals yet goals have not been fully met due to cognitive deficits.  Plan: Continue to work towards unmet goals.  Reason Skilled Services are Required: see above  Problem List Patient Active Problem List   Diagnosis Date Noted  . PSP (progressive supranuclear palsy) ([place]) 07/15/2015  . Aortic stenosis 06/09/2015  . Dysphasia 06/09/2015  . Aortic valve disorder 05/24/2014  . OSTEOPENIA 08/11/2009  . PEDAL EDEMA 07/05/2008  . BACK PAIN 12/17/2006  . Essential hypertension 11/21/2006  . Allergic rhinitis 11/21/2006  . Osteoporosis 11/21/2006  . History of colonic polyps 11/21/2006    Tiffany [name] 08/24/2015, 5:41 PM Tiffany [name], OTR/L [phone] Phone: [phone] 5:41 PM 08/24/2015 [facility] [address] [place], [name], [address] Phone: [phone]   Fax:  [phone]  Name: BILLYE PICKEREL MRN: [id number] Date of Birth: 1932/04/12

## 2015-08-24 NOTE — Therapy (Signed)
[facility] [address] [place], [name], [address] Phone: [phone]   Fax:  [phone]  Physical Therapy Treatment  Patient Details  Name: Tiffany [name] MRN: [id number] Date of Birth: 04-13-1932 Referring Provider: Alonza Bogus, DO  Encounter Date: 08/24/2015      PT End of Session - 08/24/15 1407    Visit Number 8   Number of Visits 17   Date for PT Re-Evaluation 09/17/15   Authorization Type [employer]   Authorization Time Period G Codes and progress note required every 10 visits   PT Start Time 1405   PT Stop Time 1445   PT Time Calculation (min) 40 min   Equipment Utilized During Treatment Gait belt   Activity Tolerance Patient tolerated treatment well   Behavior During Therapy [facility] for tasks assessed/performed      Past Medical History  Diagnosis Date  . ALLERGIC RHINITIS 11/21/2006  . BACK PAIN 12/17/2006  . COLONIC POLYPS, HX OF 11/21/2006  . HYPERTENSION 11/21/2006  . OSTEOPENIA 08/11/2009  . OSTEOPOROSIS 11/21/2006  . PEDAL EDEMA 07/05/2008  . SYSTOLIC MURMUR 3/87/5643    Past Surgical History  Procedure Laterality Date  . Hemorrhoid surgery    . Tonsillectomy    . Breast surgery      bx  . Cataract extraction Bilateral 2012    There were no vitals filed for this visit.      Subjective Assessment - 08/24/15 1407    Subjective No new complaints. No falls to report. Pt has her rollator with her today.    Pertinent History Likes to be called "Tiffany [name]".  PMH significant for: progressive supranuclear palsy (diagnosed 07/15/15), HTN, osteopenia, aortic stenosis, bilateral cataract extraction   Patient Stated Goals "My goal is to walk better."   Currently in Pain? No/denies   Pain Score 0-No pain              [facility] Adult PT Treatment/Exercise - 08/24/15 1409    Transfers   Transfers Sit to Stand;Stand to Sit   Sit to Stand 5: Supervision;With upper extremity assist;From bed;From  chair/3-in-1   Sit to Stand Details Verbal cues for safe use of DME/AE;Verbal cues for precautions/safety   Stand to Sit 5: Supervision;With upper extremity assist;To bed;To chair/3-in-1   Stand to Sit Details (indicate cue type and reason) Verbal cues for safe use of DME/AE;Verbal cues for precautions/safety   Ambulation/Gait   Ambulation/Gait Yes   Ambulation/Gait Assistance 5: Supervision   Ambulation/Gait Assistance Details occasional cues to slow down and for rollator proximity needed with indoor surface gait;   Ambulation Distance (Feet) 440 Feet  x1, 500 x1, ~400 x1   Assistive device Rollator   Gait Pattern Step-through pattern;Decreased stride length;Narrow base of support   Ambulation Surface Level;Indoor   Therapeutic Activites    Therapeutic Activities Lifting   Lifting worked on safety and ADL's with lifting rollator into/out of car trunk x 1 rep. min assist with max cues on technique, safety and body mechanics. Pt will need further instruction.           PWR [facility]) - 08/24/15 1526    PWR! exercises Moves in [place]! Up x10   PWR! [employer]! Twist x20   PWR! Step x20   Comments Pt performed in modified quadraped with demonstration and mod v.c. for larger amplitude movments             PT Education - 08/24/15 1924  Education provided Yes   Education Details issued seated PWR! exercises today   Person(s) Educated Patient   Methods Explanation;Demonstration;Handout;Verbal cues   Comprehension Returned demonstration;Verbal cues required;Verbalized understanding;Need further instruction          PT Short Term Goals - 08/16/15 1505    PT SHORT TERM GOAL #1   Title Pt will perform initial HEP independently to maximize functional gains made in PT.   (Target date: 08/16/15)   Baseline 08/16/15: with HEP issued to date   Status Achieved   PT SHORT TERM GOAL #2   Title Determine appropriate assistive device and request MD order, if necessary, to  decrease fall risk.   Baseline Requested MD order for rollator on 5/19.   Status Achieved   PT SHORT TERM GOAL #3   Title Pt will negotiate 1 step without rails with step-to pattern and mod I using LRAD to indicate pt safety using primary home entrance.   Baseline met on 08/16/15   Status Revised   PT SHORT TERM GOAL #4   Title Pt will ambulate >300' over level, indoor surfaces with mod I using LRAD to indicate increased safety with household ambulation.    Baseline met on 08/16/15   Status Achieved   PT SHORT TERM GOAL #5   Title Pt will negotiate standard ramp and curb step with supervision using LRAD to indicate increased safety traversing commnuity obstacles.    Baseline Met 5/19.   Status Achieved   PT SHORT TERM GOAL #6   Title Pt will improve FGA score from 13/30 to >/= 16/30 to indicate decreased fall risk.   Baseline Met 5/19 with FGA  of 16/30   Status Achieved           PT Long Term Goals - 07/20/15 1655    PT LONG TERM GOAL #1   Title Pt will independently perform HEP to indicate safe daily compliance with HEP.   (Target date: 09/13/15)   PT LONG TERM GOAL #2   Title Pt will improve FGA score from 13/30 to 19/30 to indicate significant improvement in dynamic gait stability.    PT LONG TERM GOAL #3   Title Pt will ambulate >500'' over unlevel, paved surfaces with mod I using LRAD to indicate increased safety with commnuity ambulation.    PT LONG TERM GOAL #4   Title Pt will negotiate standard ramp and curb step with mod I using LRAD to indicate increased independence traversing commnuity obstacles.    PT LONG TERM GOAL #5   Title Pt will verbalize plan for community exercise following episode of PT to indicate safe transition to community fitness regimen.            Plan - 08/24/15 1408    Clinical Impression Statement Continued to work on gait safety with pt's personal rollator today with carryover noted from previous sessions at times. Also issued seated PWR!  exercises today, however pt will need reinforcement of correct ex form/techniques. Pt also needs further instruciton in putting new rollator into/out of car for safety and body mechanics education as weather allows (or simulated in rehab gym).                                       Rehab Potential Good   PT Frequency 2x / week   PT Duration 8 weeks   PT Treatment/Interventions ADLs/Self Care Home Management;DME Instruction;Gait  training;Stair training;Neuromuscular re-education;Functional mobility training;Therapeutic activities;Therapeutic exercise;Balance training;Patient/family education   PT Next Visit Plan continue to work on balance with emphasis on large movements, gait with rollator and PWR! exercises.   Consulted and Agree with Plan of Care Patient      Patient will benefit from skilled therapeutic intervention in order to improve the following deficits and impairments:  Abnormal gait, Decreased balance, Decreased coordination, Postural dysfunction, Other (comment), Dizziness, Decreased knowledge of use of DME, Decreased safety awareness  Visit Diagnosis: Unsteadiness on feet  Other abnormalities of gait and mobility  Unspecified lack of coordination     Problem List Patient Active Problem List   Diagnosis Date Noted  . PSP (progressive supranuclear palsy) ([place]) 07/15/2015  . Aortic stenosis 06/09/2015  . Dysphasia 06/09/2015  . Aortic valve disorder 05/24/2014  . OSTEOPENIA 08/11/2009  . PEDAL EDEMA 07/05/2008  . BACK PAIN 12/17/2006  . Essential hypertension 11/21/2006  . Allergic rhinitis 11/21/2006  . Osteoporosis 11/21/2006  . History of colonic polyps 11/21/2006    Willow Ora, PTA, [facility] [address], [place], [place] [address] [phone] 08/24/2015, 7:29 PM   Name: KAIDYNCE PFISTER MRN: [id number] Date of Birth: Jan 04, 1933

## 2015-08-24 NOTE — Therapy (Signed)
[facility] [address] [place], [name], [address] Phone: [phone]   Fax:  [phone]  Speech Language Pathology Treatment  Patient Details  Name: Tiffany [name] MRN: [id number] Date of Birth: Dec 15, 1932 Referring Provider: Bluford Kaufmann, M.D.  Encounter Date: 08/24/2015      End of Session - 08/24/15 1628    Visit Number 9   Number of Visits 16   Date for SLP Re-Evaluation 08/12/15   SLP Start Time 1532   SLP Stop Time  4315   SLP Time Calculation (min) 43 min   Activity Tolerance Patient tolerated treatment well      Past Medical History  Diagnosis Date  . ALLERGIC RHINITIS 11/21/2006  . BACK PAIN 12/17/2006  . COLONIC POLYPS, HX OF 11/21/2006  . HYPERTENSION 11/21/2006  . OSTEOPENIA 08/11/2009  . OSTEOPOROSIS 11/21/2006  . PEDAL EDEMA 07/05/2008  . SYSTOLIC MURMUR 4/00/8676    Past Surgical History  Procedure Laterality Date  . Hemorrhoid surgery    . Tonsillectomy    . Breast surgery      bx  . Cataract extraction Bilateral 2012    There were no vitals filed for this visit.      Subjective Assessment - 08/24/15 1534    Subjective Pt with aphasic errors/anomia in simple conversation at beginning of ST session. Pt reports she is now taking pills with water instead of applesauce.   Currently in Pain? No/denies               ADULT SLP TREATMENT - 08/24/15 1536    General Information   Behavior/Cognition Alert;Cooperative;Pleasant mood   Treatment Provided   Treatment provided Cognitive-Linquistic   Dysphagia Treatment   Temperature Spikes Noted No   Treatment Methods Skilled observation;Compensation strategy training   Patient observed directly with PO's Yes   Other treatment/comments Initially, pt req'd occasional mod cues for chin down with H2O and "meds" (tictacs), faded to SBA.  Pt admitted she does not always put chin down at home. "But I know when I don't do it cuz I start  coughing," pt stated.    Cognitive-Linquistic Treatment   Treatment focused on Dysarthria   Skilled Treatment Pt spoke for 10 minutes iwth 2 unnatural pauses due to pt unable to use compensations for those two instances, used synonym and description compensations otherwise x3 total. During that conversaiton pt maintained intelligiblity above 95%. In her HEP, she req'd usual min A for overarticulation. Pt states she is completing the HEP for dysarthria apporx twice a week. SLP encouraged her to complete at least 4 days a week, twice a day.    Assessment / Recommendations / Plan   Plan Continue with current plan of care   Dysphagia Recommendations   Diet recommendations Regular   Liquids provided via Cup   Medication Administration Whole meds with liquid   Postural Changes and/or Swallow Maneuvers Chin tuck   Progression Toward Goals   Progression toward goals Progressing toward goals            SLP Short Term Goals - 08/24/15 1537    SLP SHORT TERM GOAL #1   Title pt will demo compensations for speech intelligibility in 10 minutes simple conversation for intelligiblity >95%   Status Achieved   SLP SHORT TERM GOAL #2   Title pt will complete HEP for dysarthria with modified independence   Time --   Period --   Status Not Met   SLP SHORT TERM GOAL #3  Title pt will demo functional verbal expression, using compensations, in 10 minutes simple conversation   Status Partially Met  2 pauses unnaturally in 10 minutes (unable to use compensations)   SLP SHORT TERM GOAL #4   Title pt will demo appropriate tuck posture for chin down with thin liquids during sessions 95% of the time with rare min A   Time --   Period --   Status Not Met          SLP Long Term Goals - 08/24/15 1631    SLP LONG TERM GOAL #1   Title pt will complete HEP for dysphagia with modified independence   Baseline all LTGs renewed for week of 08-15-15   Time 3   Period Weeks   Status On-going   SLP LONG TERM  GOAL #2   Title pt will demo compensations for speech intelligibility in 10 minutes simple to mod complex conversation for intelligiblity >95%   Time 3   Period Weeks   Status On-going   SLP LONG TERM GOAL #3   Title pt will demo functional verbal expression, using compensations, in 10 minutes mod complex conversation   Time 3   Period Weeks   Status On-going   SLP LONG TERM GOAL #4   Title pt will demo compensations for dysphagia in order to decr risk of aspiration 90% of the time with POs during 3 sessions   Time 3   Period Weeks   Status On-going          Plan - 08/24/15 1628    Clinical Impression Statement See goal update for details, but pt cont'd to require cues to carryover compensations for dysarthria, as well as cues necessary for chin tuck with H2O today. Pt would benefit from skilled ST focusing on carryover of intelligibile speech into conversation, compensations for word finding, and carryover of chin tuck to practical situations.    Speech Therapy Frequency 2x / week   Duration 4 weeks   Treatment/Interventions Aspiration precaution training;Pharyngeal strengthening exercises;Diet toleration management by SLP;SLP instruction and feedback;Compensatory strategies;Internal/external aids;Patient/family education;Functional tasks;Cueing hierarchy   Potential to Achieve Goals Fair   Potential Considerations Severity of impairments;Cooperation/participation level   Consulted and Agree with Plan of Care Patient      Patient will benefit from skilled therapeutic intervention in order to improve the following deficits and impairments:   Dysarthria and anarthria  Aphasia  Dysphagia    Problem List Patient Active Problem List   Diagnosis Date Noted  . PSP (progressive supranuclear palsy) ([facility]) 07/15/2015  . Aortic stenosis 06/09/2015  . Dysphasia 06/09/2015  . Aortic valve disorder 05/24/2014  . OSTEOPENIA 08/11/2009  . PEDAL EDEMA 07/05/2008  . BACK PAIN  12/17/2006  . Essential hypertension 11/21/2006  . Allergic rhinitis 11/21/2006  . Osteoporosis 11/21/2006  . History of colonic polyps 11/21/2006    [facility] ,[place], [place]  08/24/2015, 4:33 PM  [name] [address] [place], [name], [address] Phone: [phone]   Fax:  [phone]   Name: Tiffany [name] MRN: [id number] Date of Birth: 09/01/1932

## 2015-08-26 ENCOUNTER — Ambulatory Visit: Payer: Commercial Managed Care - HMO

## 2015-08-26 ENCOUNTER — Ambulatory Visit: Payer: Commercial Managed Care - HMO | Admitting: Occupational Therapy

## 2015-08-26 ENCOUNTER — Ambulatory Visit: Payer: Commercial Managed Care - HMO | Attending: Internal Medicine | Admitting: Physical Therapy

## 2015-08-26 DIAGNOSIS — R279 Unspecified lack of coordination: Secondary | ICD-10-CM | POA: Insufficient documentation

## 2015-08-26 DIAGNOSIS — R278 Other lack of coordination: Secondary | ICD-10-CM | POA: Diagnosis not present

## 2015-08-26 DIAGNOSIS — R29818 Other symptoms and signs involving the nervous system: Secondary | ICD-10-CM | POA: Insufficient documentation

## 2015-08-26 DIAGNOSIS — R4701 Aphasia: Secondary | ICD-10-CM | POA: Insufficient documentation

## 2015-08-26 DIAGNOSIS — R1310 Dysphagia, unspecified: Secondary | ICD-10-CM | POA: Insufficient documentation

## 2015-08-26 DIAGNOSIS — R2689 Other abnormalities of gait and mobility: Secondary | ICD-10-CM | POA: Diagnosis not present

## 2015-08-26 DIAGNOSIS — R471 Dysarthria and anarthria: Secondary | ICD-10-CM

## 2015-08-26 DIAGNOSIS — R29898 Other symptoms and signs involving the musculoskeletal system: Secondary | ICD-10-CM | POA: Insufficient documentation

## 2015-08-26 DIAGNOSIS — R2681 Unsteadiness on feet: Secondary | ICD-10-CM

## 2015-08-26 NOTE — Patient Instructions (Signed)
Feet Apart, Head Motion - Eyes Open    Stand with your back to a corner with your locked rollator in front of you. With eyes open, feet apart, move head slowly: up and down 5 times; right to left 5 times. Find an object/target to look at in each direction when you turn your head. Repeat __2__ times per day.  * Be sure to stop and rest if your dizziness increases above 4/10.

## 2015-08-26 NOTE — Therapy (Signed)
[facility] [address] [place], [name], [address] Phone: [phone]   Fax:  [phone]  Speech Language Pathology Treatment  Patient Details  Name: Tiffany [name] MRN: [id number] Date of Birth: 1932-08-31 Referring Provider: Bluford Kaufmann, M.D.  Encounter Date: 08/26/2015      End of Session - 08/26/15 1532    Visit Number 10   Number of Visits 16   Date for SLP Re-Evaluation 08/12/15   Authorization Type humana   SLP Start Time 1320   SLP Stop Time  1400   SLP Time Calculation (min) 40 min   Activity Tolerance Patient tolerated treatment well      Past Medical History  Diagnosis Date  . ALLERGIC RHINITIS 11/21/2006  . BACK PAIN 12/17/2006  . COLONIC POLYPS, HX OF 11/21/2006  . HYPERTENSION 11/21/2006  . OSTEOPENIA 08/11/2009  . OSTEOPOROSIS 11/21/2006  . PEDAL EDEMA 07/05/2008  . SYSTOLIC MURMUR 0/94/7096    Past Surgical History  Procedure Laterality Date  . Hemorrhoid surgery    . Tonsillectomy    . Breast surgery      bx  . Cataract extraction Bilateral 2012    There were no vitals filed for this visit.      Subjective Assessment - 08/26/15 1324    Currently in Pain? No/denies               ADULT SLP TREATMENT - 08/26/15 1324    General Information   Behavior/Cognition Alert;Cooperative;Pleasant mood   Treatment Provided   Treatment provided Cognitive-Linquistic   Dysphagia Treatment   Temperature Spikes Noted No   Treatment Methods Skilled observation;Compensation strategy training   Patient observed directly with PO's Yes   Type of PO's observed Thin liquids   Other treatment/comments Pt was 70% successful in performing chin tuck with sips water throughout session.    Cognitive-Linquistic Treatment   Treatment focused on Dysarthria   Skilled Treatment SLP facilitated pt's speech intelligibility with focus on using compensatory techniques in sentence and higher linguistic  levels. In sentence tasks pt was 95%+ intelligibile, and in conversation regarding a simple topic pt req:d min-mod cues usually for overarticulation. Conversational intelligiblity remained above 95%.   Assessment / Recommendations / Plan   Plan Continue with current plan of care   Progression Toward Goals   Progression toward goals Progressing toward goals            SLP Short Term Goals - 08/24/15 1537    SLP SHORT TERM GOAL #1   Title pt will demo compensations for speech intelligibility in 10 minutes simple conversation for intelligiblity >95%   Status Achieved   SLP SHORT TERM GOAL #2   Title pt will complete HEP for dysarthria with modified independence   Time --   Period --   Status Not Met   SLP SHORT TERM GOAL #3   Title pt will demo functional verbal expression, using compensations, in 10 minutes simple conversation   Status Partially Met  2 pauses unnaturally in 10 minutes (unable to use compensations)   SLP SHORT TERM GOAL #4   Title pt will demo appropriate tuck posture for chin down with thin liquids during sessions 95% of the time with rare min A   Time --   Period --   Status Not Met          SLP Long Term Goals - 08/26/15 1533    SLP LONG TERM GOAL #1   Title pt will complete  HEP for dysphagia with modified independence   Baseline all LTGs renewed for week of 08-15-15   Time 3   Period Weeks   Status On-going   SLP LONG TERM GOAL #2   Title pt will demo compensations for speech intelligibility in 10 minutes simple to mod complex conversation for intelligiblity >95%   Status Achieved   SLP LONG TERM GOAL #3   Title pt will demo functional verbal expression, using compensations, in 10 minutes mod complex conversation   Time 3   Period Weeks   Status On-going   SLP LONG TERM GOAL #4   Title pt will demo compensations for dysphagia in order to decr risk of aspiration 90% of the time with POs during 3 sessions   Time 3   Period Weeks   Status On-going           Plan - 09/23/2015 1532    Clinical Impression Statement See goal update for details, but pt cont'd to require cues to carryover compensations for dysarthria, as well as cues necessary for chin tuck with H2O today. Pt would benefit from skilled ST focusing on carryover of intelligibile speech into conversation, compensations for word finding, and carryover of chin tuck to practical situations.    Speech Therapy Frequency 2x / week   Duration 4 weeks   Treatment/Interventions Aspiration precaution training;Pharyngeal strengthening exercises;Diet toleration management by SLP;SLP instruction and feedback;Compensatory strategies;Internal/external aids;Patient/family education;Functional tasks;Cueing hierarchy   Potential to Achieve Goals Fair   Potential Considerations Severity of impairments;Cooperation/participation level   Consulted and Agree with Plan of Care Patient     Speech Therapy Progress Note  Dates of Reporting Period: 07-12-15 to present  Subjective Statement: Pt has undergone 10 ST sessions  Objective: Pt partially met STGs, and is on her way to meeting LTGs partially as well. Her intelligibility has increased to 95%+, mostly because she has slowed her rate. Overarticulation has not improved to the same degree. Pt con't to require SLP cues for swallow precautions. No overt s/s aspiration PNA to date.  Goal Update: See goal update above.  Plan: Cont to see pt for at least 2 more weeks. Likely d/c at that time due to rehab potential met.  Reason Skilled Services are Required: Habitualize incr'd intelligibility and swallow safety.     Patient will benefit from skilled therapeutic intervention in order to improve the following deficits and impairments:   Dysarthria and anarthria  Aphasia  Dysphagia      G-Codes - 09-23-15 1539    Functional Assessment Tool Used NOMS-6 (20-25% impaired)   Functional Limitations Motor speech   Swallow Current Status (U9811) At  least 20 percent but less than 40 percent impaired, limited or restricted   Swallow Goal Status (B1478) At least 1 percent but less than 20 percent impaired, limited or restricted      Problem List Patient Active Problem List   Diagnosis Date Noted  . PSP (progressive supranuclear palsy) ([place]) 07/15/2015  . Aortic stenosis 06/09/2015  . Dysphasia 06/09/2015  . Aortic valve disorder 05/24/2014  . OSTEOPENIA 08/11/2009  . PEDAL EDEMA 07/05/2008  . BACK PAIN 12/17/2006  . Essential hypertension 11/21/2006  . Allergic rhinitis 11/21/2006  . Osteoporosis 11/21/2006  . History of colonic polyps 11/21/2006    [facility] 09/23/2015, 3:40 PM  [facility] [address] [place], [name], [address] Phone: [phone]   Fax:  [phone]   Name: Tiffany [name] MRN: [id number] Date of Birth: 15-Apr-1932

## 2015-08-26 NOTE — Therapy (Signed)
[facility] [address] [place], [name], [address] Phone: [phone]   Fax:  [phone]  Occupational Therapy Treatment  Patient Details  Name: Tiffany [name] MRN: [id number] Date of Birth: 11-19-1932 Referring Provider: Dr. Carles Collet  Encounter Date: 08/26/2015      OT End of Session - 08/26/15 1511    Visit Number 11   Number of Visits 17   Date for OT Re-Evaluation 09/17/15   Authorization Type [employer]   Authorization - Visit Number 11   Authorization - Number of Visits 20   OT Start Time 1450   OT Stop Time 1530   OT Time Calculation (min) 40 min      Past Medical History  Diagnosis Date  . ALLERGIC RHINITIS 11/21/2006  . BACK PAIN 12/17/2006  . COLONIC POLYPS, HX OF 11/21/2006  . HYPERTENSION 11/21/2006  . OSTEOPENIA 08/11/2009  . OSTEOPOROSIS 11/21/2006  . PEDAL EDEMA 07/05/2008  . SYSTOLIC MURMUR 0/25/4270    Past Surgical History  Procedure Laterality Date  . Hemorrhoid surgery    . Tonsillectomy    . Breast surgery      bx  . Cataract extraction Bilateral 2012    There were no vitals filed for this visit.                 Fastening buttons after PWR! hands using larger amplitude movements with improved performance.      PWR [facility]) - 08/26/15 1503    PWR! exercises Moves in [place]! Up x10   PWR! Rock x10   PWR! Twist x20   PWR! Step x20   Comments min v.c. and demonstration     copying small peg design for cognitive/ visual component  with RUE in standing with trunk rotation, min difficulty/ v.c. Then removing pegs with large amplitude movements and trunk rotation, min v.c. And supervision          OT Short Term Goals - 08/24/15 1524    OT SHORT TERM GOAL #1   Title I with HEP. due 08/17/15   Status On-going  needs reinforcement   OT SHORT TERM GOAL #2   Title Pt will demonstrate improved fine motor coordination as evidednce by decreasing 9 hole peg  test score to 45 secs or less.   Time 4   Period Weeks   Status Not Met  47.44 sec   OT SHORT TERM GOAL #3   Title Pt will verbalize understanding of adapted strategies for ADLs.   Time 4   Period Weeks   Status On-going  needs reinforcement   OT SHORT TERM GOAL #4   Title Pt will verbalize understanding of compensatory strategies for visual changes.   Time 4   Period Weeks   Status On-going  needs reinforcement   OT SHORT TERM GOAL #5   Title Pt will write a short paragraph with 100 % legibility and minimal decrease in letter size.   Time 4   Period Weeks   Status Achieved           OT Long Term Goals - 07/19/15 1630    OT LONG TERM GOAL #1   Title Pt will demonstrate improved fine motor coordination as evidenced by decreasing RUE 9 hole peg test score to 40 secs or less.- due 09/16/15   Baseline RUE 51.69, LUE 29.53 secs   Time 8   Period Weeks   Status New   OT LONG TERM GOAL #2  Title Pt will improve RUE standing functional reach to 10 " or greater for improved standing balance.   Baseline RUE 9", LUE 10"   Time 8   Period Weeks   Status New   OT LONG TERM GOAL #3   Title Pt will improve RUE box/ blocks score to 41 blocks.   Baseline baseline: RUE 38 blocks, LUE 43 blocks   Time 8   Period Weeks   Status New   OT LONG TERM GOAL #4   Title Pt will demonstrate improved ease with fasteners as evidenced  by performing 3 button/ unbutton in 26 secs or less.   Baseline 29.22 secs   Time 8   Period Weeks   Status New   OT LONG TERM GOAL #5   Title Pt will verbalize understanding of strategies to minimize freezing episodes   Time 8   Period Weeks   Status New               Plan - 08/26/15 1452    Clinical Impression Statement Pt is progressing towards goals for PD specific HEP and adpted strategies with ADLs.   Rehab Potential Good   OT Frequency 2x / week   OT Duration 8 weeks   OT Treatment/Interventions Self-care/ADL training;Therapeutic  exercise;Patient/family education;Balance training;Splinting;Manual Therapy;Neuromuscular education;Therapeutic exercises;Therapeutic activities;Fluidtherapy;Cognitive remediation/compensation;Visual/perceptual remediation/compensation;Passive range of motion;Moist Heat;Contrast Bath;DME and/or AE instruction;Parrafin;Cryotherapy;Energy conservation;Ultrasound   Plan reinforce adapted strategies for ADLs   OT Home Exercise Plan Education provided:  coordination HEP; PD Handwriting tips (08/04/15) prone PWR!    Consulted and Agree with Plan of Care Patient      Patient will benefit from skilled therapeutic intervention in order to improve the following deficits and impairments:  Decreased coordination, Decreased range of motion, Decreased safety awareness, Decreased endurance, Decreased activity tolerance, Impaired tone, Impaired UE functional use, Pain, Decreased knowledge of use of DME, Decreased balance, Decreased cognition, Decreased mobility, Decreased strength, Impaired vision/preception  Visit Diagnosis: Other lack of coordination  Other abnormalities of gait and mobility  Unsteadiness on feet  Other symptoms and signs involving the nervous system  Other symptoms and signs involving the musculoskeletal system    Problem List Patient Active Problem List   Diagnosis Date Noted  . PSP (progressive supranuclear palsy) ([place]) 07/15/2015  . Aortic stenosis 06/09/2015  . Dysphasia 06/09/2015  . Aortic valve disorder 05/24/2014  . OSTEOPENIA 08/11/2009  . PEDAL EDEMA 07/05/2008  . BACK PAIN 12/17/2006  . Essential hypertension 11/21/2006  . Allergic rhinitis 11/21/2006  . Osteoporosis 11/21/2006  . History of colonic polyps 11/21/2006    Tiffany [name] 08/26/2015, 3:15 PM [name], OTR/L [phone] Phone: [phone] 3:15 PM 08/26/2015 [facility] [address] [place], [name], [address] Phone:  [phone]   Fax:  [phone]  Name: Tiffany [name] MRN: [id number] Date of Birth: May 03, 1932

## 2015-08-26 NOTE — Therapy (Signed)
[facility] [address] [place], [place], [address] Phone: [phone]   Fax:  [phone]  Physical Therapy Treatment  Patient Details  Name: Tiffany [name] MRN: [id number] Date of Birth: 1932-05-10 Referring Provider: Kerin Salen, DO  Encounter Date: 08/26/2015      PT End of Session - 08/26/15 1501    Visit Number 9   Number of Visits 17   Date for PT Re-Evaluation 09/17/15   Authorization Type [employer]   Authorization Time Period G Codes and progress note required every 10 visits   PT Start Time 1402   PT Stop Time 1445   PT Time Calculation (min) 43 min   Equipment Utilized During Treatment Gait belt   Activity Tolerance Patient tolerated treatment well   Behavior During Therapy [facility] for tasks assessed/performed      Past Medical History  Diagnosis Date  . ALLERGIC RHINITIS 11/21/2006  . BACK PAIN 12/17/2006  . COLONIC POLYPS, HX OF 11/21/2006  . HYPERTENSION 11/21/2006  . OSTEOPENIA 08/11/2009  . OSTEOPOROSIS 11/21/2006  . PEDAL EDEMA 07/05/2008  . SYSTOLIC MURMUR 10/12/2008    Past Surgical History  Procedure Laterality Date  . Hemorrhoid surgery    . Tonsillectomy    . Breast surgery      bx  . Cataract extraction Bilateral 2012    There were no vitals filed for this visit.      Subjective Assessment - 08/26/15 1402    Subjective Pt reports no falls and no significant changes. Reports getting rollator into/out of car "is not easy." However, pt's friend drove her to this session. When asked about dizziness, pt states, "I get dizzy when I look up or look down."   Pertinent History Likes to be called "Tiffany [name]".  PMH significant for: progressive supranuclear palsy (diagnosed 07/15/15), HTN, osteopenia, aortic stenosis, bilateral cataract extraction   Patient Stated Goals "My goal is to walk better."   Currently in Pain? No/denies                Vestibular Assessment - 08/26/15 0001    Occulomotor Exam   Saccades Poor trajectory  impaired oculomotor coordination; increased dizziness   Vestibulo-Occular Reflex   VOR 1 Head Only (x 1 viewing) Impaired (loses target) with vertical > horizontal head movement. Increased dizziness (lasts seconds) with x1 viewing.                 [facility] Adult PT Treatment/Exercise - 08/26/15 0001    Transfers   Transfers Sit to Stand;Stand to Sit   Sit to Stand 6: Modified independent (Device/Increase time)   Sit to Stand Details (indicate cue type and reason) Effective between-session carryover of locking rollator brakes before all transfers   Stand to Sit 5: Supervision;6: Modified independent (Device/Increase time)   Stand to Sit Details Cueing required for safe proximity to chair prior to sitting during 1 transfer during this session. All other transfers with mod I using rollator.   Ambulation/Gait   Ambulation/Gait Yes   Ambulation/Gait Assistance 5: Supervision   Ambulation/Gait Assistance Details Initially, PT provided cueing for compensatory strategy for impaired VOR (turning eyes/head, then body) during gait with effective return demo from pt. Progessed from 90-degree turns (bilat directions) to 180-degree then 360-degree turns (quarter turns with visual spotting). May need reinforcement of 180 and 360-degree turns in future session.    Ambulation Distance (Feet) 460 Feet   Assistive device Rollator   Gait Pattern Step-through pattern;Decreased stride length;Narrow base  of support   Ambulation Surface Level;Indoor         Vestibular Treatment/Exercise - 08/26/15 0001    Vestibular Treatment/Exercise   Vestibular Treatment Provided Habituation   Habituation Exercises Standing Horizontal Head Turns;Standing Vertical Head Turns;180 degree Turns;360 degree Turns   Gaze Exercises Eye/Head Exercise Horizontal   Standing Horizontal Head Turns   Number of Reps  5  2 x3 reps then 2 x5 reps with gaze fixation   Symptom Description   3/10 dizziness without gaze fixation; 0/10 dizziness with gaze fixation   Standing Vertical Head Turns   Number of Reps  5  2 x3 reps then 2 x5 reps with gaze fixation   Symptom Description  5/10 dizziness initially; 2/10 dizziness with gaze fixatin   180 degree Turns   Number of Reps  3   Symptom Description  1/4 turns with gaze fixation after each quarter turn   360 degree Turns   Number of Reps  3   COMMENT 1/4 turns with gaze fixation after each quarter turn   Eye/Head Exercise Horizontal   Foot Position standing with BUE support at locked rollator   Reps 10  x2 trials   Comments visual targets 6 inches apart for initial trial, 10" apart for second trial          PWR [facility]) - 08/26/15 1503    PWR! exercises Moves in [place]! Up x10   PWR! Rock x10   PWR! Twist x20   PWR! Step x20   Comments min v.c. and demonstration             PT Education - 08/26/15 1454    Education provided Yes   Education Details HEP: reviewed seated PWR! exercises; added standing head turns with visual spotting/fixation.    Person(s) Educated Patient   Methods Explanation;Demonstration   Comprehension Verbalized understanding;Returned demonstration          PT Short Term Goals - 08/16/15 1505    PT SHORT TERM GOAL #1   Title Pt will perform initial HEP independently to maximize functional gains made in PT.   (Target date: 08/16/15)   Baseline 08/16/15: with HEP issued to date   Status Achieved   PT SHORT TERM GOAL #2   Title Determine appropriate assistive device and request MD order, if necessary, to decrease fall risk.   Baseline Requested MD order for rollator on 5/19.   Status Achieved   PT SHORT TERM GOAL #3   Title Pt will negotiate 1 step without rails with step-to pattern and mod I using LRAD to indicate pt safety using primary home entrance.   Baseline met on 08/16/15   Status Revised   PT SHORT TERM GOAL #4   Title Pt will ambulate >300' over level, indoor  surfaces with mod I using LRAD to indicate increased safety with household ambulation.    Baseline met on 08/16/15   Status Achieved   PT SHORT TERM GOAL #5   Title Pt will negotiate standard ramp and curb step with supervision using LRAD to indicate increased safety traversing commnuity obstacles.    Baseline Met 5/19.   Status Achieved   PT SHORT TERM GOAL #6   Title Pt will improve FGA score from 13/30 to >/= 16/30 to indicate decreased fall risk.   Baseline Met 5/19 with FGA  of 16/30   Status Achieved           PT Long Term Goals - 07/20/15 1655  PT LONG TERM GOAL #1   Title Pt will independently perform HEP to indicate safe daily compliance with HEP.   (Target date: 09/13/15)   PT LONG TERM GOAL #2   Title Pt will improve FGA score from 13/30 to 19/30 to indicate significant improvement in dynamic gait stability.    PT LONG TERM GOAL #3   Title Pt will ambulate >500'' over unlevel, paved surfaces with mod I using LRAD to indicate increased safety with commnuity ambulation.    PT LONG TERM GOAL #4   Title Pt will negotiate standard ramp and curb step with mod I using LRAD to indicate increased independence traversing commnuity obstacles.    PT LONG TERM GOAL #5   Title Pt will verbalize plan for community exercise following episode of PT to indicate safe transition to community fitness regimen.               Plan - 08/26/15 1504    Clinical Impression Statement Session focused on use of compensatory strategies for impaired VOR during mobility to address dizziness and maximize pt safety. Noted both VOR and saccades impaired and increased pt dizziness. Therefore, educated pt on compensatory strategies, including gaze fixation; turning eyes/head then body; and breaking 180 and 360-degree into 1/4 turns during gait. Pt did demonstrate effective within-session carryover of techniques.    Rehab Potential Good   PT Frequency 2x / week   PT Duration 8 weeks   PT  Treatment/Interventions ADLs/Self Care Home Management;DME Instruction;Gait training;Stair training;Neuromuscular re-education;Functional mobility training;Therapeutic activities;Therapeutic exercise;Balance training;Patient/family education   PT Next Visit Plan **GCODES and PN. Review home exercise for standing, head turns with spotting. Continue to work on balance with emphasis on large amplitude movements, gait with rollator and PWR! exercises.   Consulted and Agree with Plan of Care Patient      Patient will benefit from skilled therapeutic intervention in order to improve the following deficits and impairments:  Abnormal gait, Decreased balance, Decreased coordination, Postural dysfunction, Other (comment), Dizziness, Decreased knowledge of use of DME, Decreased safety awareness  Visit Diagnosis: Other abnormalities of gait and mobility  Unsteadiness on feet  Unspecified lack of coordination     Problem List Patient Active Problem List   Diagnosis Date Noted  . PSP (progressive supranuclear palsy) ([place]) 07/15/2015  . Aortic stenosis 06/09/2015  . Dysphasia 06/09/2015  . Aortic valve disorder 05/24/2014  . OSTEOPENIA 08/11/2009  . PEDAL EDEMA 07/05/2008  . BACK PAIN 12/17/2006  . Essential hypertension 11/21/2006  . Allergic rhinitis 11/21/2006  . Osteoporosis 11/21/2006  . History of colonic polyps 11/21/2006   Billie Ruddy, PT, DPT [facility] [address] [place], [name], [address] Phone: [phone]   Fax:  [phone] 08/26/2015, 3:09 PM  Name: BRITANEE VANBLARCOM MRN: [id number] Date of Birth: 1932/07/07

## 2015-08-30 ENCOUNTER — Encounter: Payer: Self-pay | Admitting: Physical Therapy

## 2015-08-30 ENCOUNTER — Ambulatory Visit: Payer: Commercial Managed Care - HMO | Admitting: Physical Therapy

## 2015-08-30 ENCOUNTER — Ambulatory Visit: Payer: Commercial Managed Care - HMO | Admitting: Speech Pathology

## 2015-08-30 ENCOUNTER — Ambulatory Visit: Payer: Commercial Managed Care - HMO | Admitting: Occupational Therapy

## 2015-08-30 DIAGNOSIS — R2681 Unsteadiness on feet: Secondary | ICD-10-CM

## 2015-08-30 DIAGNOSIS — R2689 Other abnormalities of gait and mobility: Secondary | ICD-10-CM | POA: Diagnosis not present

## 2015-08-30 DIAGNOSIS — R471 Dysarthria and anarthria: Secondary | ICD-10-CM | POA: Diagnosis not present

## 2015-08-30 DIAGNOSIS — R278 Other lack of coordination: Secondary | ICD-10-CM

## 2015-08-30 DIAGNOSIS — R29818 Other symptoms and signs involving the nervous system: Secondary | ICD-10-CM | POA: Diagnosis not present

## 2015-08-30 DIAGNOSIS — R1310 Dysphagia, unspecified: Secondary | ICD-10-CM

## 2015-08-30 DIAGNOSIS — R29898 Other symptoms and signs involving the musculoskeletal system: Secondary | ICD-10-CM | POA: Diagnosis not present

## 2015-08-30 DIAGNOSIS — R279 Unspecified lack of coordination: Secondary | ICD-10-CM | POA: Diagnosis not present

## 2015-08-30 DIAGNOSIS — R4701 Aphasia: Secondary | ICD-10-CM | POA: Diagnosis not present

## 2015-08-30 NOTE — Therapy (Signed)
[facility] [address] [place], [name], [address] Phone: [phone]   Fax:  [phone]  Physical Therapy Treatment  Patient Details  Name: Tiffany [name] MRN: [id number] Date of Birth: [birth year] Referring Provider: Alonza Bogus, DO  Encounter Date: 08/30/2015      PT End of Session - 08/30/15 1451    Visit Number 10   Number of Visits 17   Date for PT Re-Evaluation 09/17/15   Authorization Type [employer]   Authorization Time Period G Codes and progress note required every 10 visits   PT Start Time 1446   PT Stop Time 1527   PT Time Calculation (min) 41 min   Equipment Utilized During Treatment Gait belt   Activity Tolerance Patient tolerated treatment well   Behavior During Therapy [facility] for tasks assessed/performed      Past Medical History  Diagnosis Date  . ALLERGIC RHINITIS 11/21/2006  . BACK PAIN 12/17/2006  . COLONIC POLYPS, HX OF 11/21/2006  . HYPERTENSION 11/21/2006  . OSTEOPENIA 08/11/2009  . OSTEOPOROSIS 11/21/2006  . PEDAL EDEMA 07/05/2008  . SYSTOLIC MURMUR 4/96/7591    Past Surgical History  Procedure Laterality Date  . Hemorrhoid surgery    . Tonsillectomy    . Breast surgery      bx  . Cataract extraction Bilateral 2012    There were no vitals filed for this visit.      Subjective Assessment - 08/30/15 1450    Subjective No new complaints. No falls or pain to report. Pt states use of the rollator is going well.   Pertinent History Likes to be called "Tiffany [name]".  PMH significant for: progressive supranuclear palsy (diagnosed 07/15/15), HTN, osteopenia, aortic stenosis, bilateral cataract extraction   Patient Stated Goals "My goal is to walk better."   Currently in Pain? No/denies   Pain Score 0-No pain            [facility] PT Assessment - 08/30/15 1454    Functional Gait  Assessment   Gait assessed  Yes   Gait Level Surface Walks 20 ft in less than 7 sec but greater than 5.5 sec,  uses assistive device, slower speed, mild gait deviations, or deviates 6-10 in outside of the 12 in walkway width.   Change in Gait Speed Able to smoothly change walking speed without loss of balance or gait deviation. Deviate no more than 6 in outside of the 12 in walkway width.   Gait with Horizontal Head Turns Performs head turns with moderate changes in gait velocity, slows down, deviates 10-15 in outside 12 in walkway width but recovers, can continue to walk.   Gait with Vertical Head Turns Performs task with slight change in gait velocity (eg, minor disruption to smooth gait path), deviates 6 - 10 in outside 12 in walkway width or uses assistive device   Gait and Pivot Turn Pivot turns safely within 3 sec and stops quickly with no loss of balance.   Step Over Obstacle Is able to step over one shoe box (4.5 in total height) without changing gait speed. No evidence of imbalance.   Gait with Narrow Base of Support Ambulates less than 4 steps heel to toe or cannot perform without assistance.   Gait with Eyes Closed Walks 20 ft, no assistive devices, good speed, no evidence of imbalance, normal gait pattern, deviates no more than 6 in outside 12 in walkway width. Ambulates 20 ft in less than 7 sec.   Ambulating Backwards  Walks 20 ft, no assistive devices, good speed, no evidence for imbalance, normal gait   Steps Alternating feet, must use rail.   Total Score 21   FGA comment: <19/30 indicates high fall risk.             Balance Exercises - 08/30/15 1508    Balance Exercises: Standing   Standing Eyes Opened Narrow base of support (BOS);Wide (BOA);Head turns;Foam/compliant surface;Solid surface;Other reps (comment);Limitations   Standing Eyes Closed Narrow base of support (BOS);Wide (BOA);Head turns;Foam/compliant surface;Solid surface;Other reps (comment);Limitations   Balance Exercises: Standing   Standing Eyes Closed Limitations progressed form wide base of support to narrow base of  support on floor, then on aire ex in corner with chair in front of her for safety: EO no head movements>EC no head movements>EO head movements up<>down, left<>right>EC head movements up<>down and left<><right, all with min guard to min assist for balance, no UE support.                                PT Short Term Goals - 08/30/15 1701    PT SHORT TERM GOAL #1   Title Pt will perform initial HEP independently to maximize functional gains made in PT.   (Target date: 08/16/15)   Baseline 08/16/15: with HEP issued to date   Status Achieved   PT SHORT TERM GOAL #2   Title Determine appropriate assistive device and request MD order, if necessary, to decrease fall risk.   Baseline Requested MD order for rollator on 5/19.   Status Achieved   PT SHORT TERM GOAL #3   Title Pt will negotiate 1 step without rails with step-to pattern and mod I using LRAD to indicate pt safety using primary home entrance.   Baseline met on 08/16/15   Status Achieved   PT SHORT TERM GOAL #4   Title Pt will ambulate >300' over level, indoor surfaces with mod I using LRAD to indicate increased safety with household ambulation.    Baseline met on 08/16/15   Status Achieved   PT SHORT TERM GOAL #5   Title Pt will negotiate standard ramp and curb step with supervision using LRAD to indicate increased safety traversing commnuity obstacles.    Baseline Met 5/19.   Status Achieved   PT SHORT TERM GOAL #6   Title Pt will improve FGA score from 13/30 to >/= 16/30 to indicate decreased fall risk.   Baseline Met 5/19 with FGA  of 16/30   Status Achieved            PT Long Term Goals - 08/30/15 1659    PT LONG TERM GOAL #1   Title Pt will independently perform HEP to indicate safe daily compliance with HEP.   (Target date: 09/13/15)   Status On-going   PT LONG TERM GOAL #2   Title Pt will improve FGA score from 13/30 to 23/30 to indicate significant improvement in dynamic gait stability.    Baseline 6/6: REVISED from  19/30 to 23/30 due to pt progressing more quickly than anticipated.   Status Revised   PT LONG TERM GOAL #3   Title Pt will ambulate >500'' over unlevel, paved surfaces with mod I using LRAD to indicate increased safety with commnuity ambulation.    Status On-going   PT LONG TERM GOAL #4   Title Pt will negotiate standard ramp and curb step with mod I using LRAD to indicate increased independence  traversing commnuity obstacles.    Status On-going   PT LONG TERM GOAL #5   Title Pt will verbalize plan for community exercise following episode of PT to indicate safe transition to community fitness regimen.   Status On-going            Plan - 09-06-2015 1451    Clinical Impression Statement Pt with improved score on functional gait test today, 21/30 scored today. Remainder of session addressed corner balance activites with occasional increase in dizziness reported that resolved with seated rest breaks. Pt is making steady progress toward goalsl    Rehab Potential Good   PT Frequency 2x / week   PT Duration 8 weeks   PT Treatment/Interventions ADLs/Self Care Home Management;DME Instruction;Gait training;Stair training;Neuromuscular re-education;Functional mobility training;Therapeutic activities;Therapeutic exercise;Balance training;Patient/family education   PT Next Visit Plan  head turns with spotting. Continue to work on balance with emphasis on large amplitude movements, gait with rollator and PWR! exercises.   Consulted and Agree with Plan of Care Patient      Patient will benefit from skilled therapeutic intervention in order to improve the following deficits and impairments:  Abnormal gait, Decreased balance, Decreased coordination, Postural dysfunction, Other (comment), Dizziness, Decreased knowledge of use of DME, Decreased safety awareness  Visit Diagnosis: Other abnormalities of gait and mobility  Unsteadiness on feet  Other lack of coordination       G-Codes - 2015-09-06  1629    Functional Assessment Tool Used FGA 21/30      Problem List Patient Active Problem List   Diagnosis Date Noted  . PSP (progressive supranuclear palsy) ([place]) 07/15/2015  . Aortic stenosis 06/09/2015  . Dysphasia 06/09/2015  . Aortic valve disorder 05/24/2014  . OSTEOPENIA 08/11/2009  . PEDAL EDEMA 07/05/2008  . BACK PAIN 12/17/2006  . Essential hypertension 11/21/2006  . Allergic rhinitis 11/21/2006  . Osteoporosis 11/21/2006  . History of colonic polyps 11/21/2006    Willow Ora, PTA, [facility] [address] [place], [place] [address] [phone] September 06, 2015, 4:33 PM   Name: ALEENA KIRKEBY MRN: [id number] Date of Birth: [birth year]   Addendum by Billie Ruddy, PT, [facility] [address] [place], [name], [address] Phone: [phone]   Fax:  [phone] 09-06-2015, 5:03 PM       G-Codes - 06-Sep-2015 1629    Functional Assessment Tool Used FGA 21/30   Functional Limitation Mobility: Walking and moving around   Mobility: Walking and Moving Around Current Status [phone]) At least 20 percent but less than 40 percent impaired, limited or restricted   Mobility: Walking and Moving Around Goal Status [phone]) At least 20 percent but less than 40 percent impaired, limited or restricted      Physical Therapy Progress Note  Dates of Reporting Period: 07/19/15 to 09/06/2015  Objective Reports of Subjective Statement: See Subjective section above for details.  Objective Measurements: FGA = 21/30  Goal Update: Revised FGA goal to update from 19/30 to 23/30 due to pt having scored 21/30 on FGA today. See LTG's above.  Plan: Continue to address LTG's above.  Reason Skilled Services are Required: To maximize pt stability/independence with functional mobility, decrease fall risk.  Billie Ruddy, PT, DPT [facility] [address] [place], [name],  [address] Phone: [phone]   Fax:  [phone] September 06, 2015, 5:06 PM

## 2015-08-30 NOTE — Therapy (Signed)
[facility] [address] [place], [name], [address] Phone: [phone]   Fax:  [phone]  Occupational Therapy Treatment  Patient Details  Name: Tiffany [name] MRN: [id number] Date of Birth: 28-Jan-1933 Referring Provider: Dr. Carles Collet  Encounter Date: 08/30/2015    Past Medical History  Diagnosis Date  . ALLERGIC RHINITIS 11/21/2006  . BACK PAIN 12/17/2006  . COLONIC POLYPS, HX OF 11/21/2006  . HYPERTENSION 11/21/2006  . OSTEOPENIA 08/11/2009  . OSTEOPOROSIS 11/21/2006  . PEDAL EDEMA 07/05/2008  . SYSTOLIC MURMUR [id number]    Past Surgical History  Procedure Laterality Date  . Hemorrhoid surgery    . Tonsillectomy    . Breast surgery      bx  . Cataract extraction Bilateral 2012    There were no vitals filed for this visit.            Ambulating with rollator and scanning for playing cards incorporating organized scan pattern and head turn, mod difficulty. Pt was issued handout of vision compensations and she verbalizes understanding. Pt needs reinforcement to put strategies into practice. Tabletop scanning for 53M number cancellation using line guide, pt got 100% accuracy, she reports using her finger to assist with scanning at home. Grooved pegs for improved fine motor coordination with bilateral UE's, removing using PWR! Step with hands, min v.c.           PWR [facility]) - 08/30/15 1516    PWR! exercises Moves in prone   PWR! Up x10   PWR! [employer]! Twist x20   PWR! Step x10   Comments min v.c. with improved performance today, pt demonstrates improved carryover               OT Short Term Goals - 08/30/15 1514    OT SHORT TERM GOAL #1   Title I with HEP. due 08/17/15   Status Achieved  needs reinforcement   OT SHORT TERM GOAL #2   Title Pt will demonstrate improved fine motor coordination as evidenced by decreasing 9 hole peg test score to 45 secs or less.   Time 4   Period  Weeks   Status On-going  47.44 sec   OT SHORT TERM GOAL #3   Title Pt will verbalize understanding of adapted strategies for ADLs.   Time 4   Period Weeks   Status On-going  needs reinforcement   OT SHORT TERM GOAL #4   Title Pt will verbalize understanding of compensatory strategies for visual changes.   Time 4   Period Weeks   Status Achieved  needs reinforcement   OT SHORT TERM GOAL #5   Title Pt will write a short paragraph with 100 % legibility and minimal decrease in letter size.   Time 4   Period Weeks   Status Achieved           OT Long Term Goals - 07/19/15 1630    OT LONG TERM GOAL #1   Title Pt will demonstrate improved fine motor coordination as evidenced by decreasing RUE 9 hole peg test score to 40 secs or less.- due 09/16/15   Baseline RUE 51.69, LUE 29.53 secs   Time 8   Period Weeks   Status New   OT LONG TERM GOAL #2   Title Pt will improve RUE standing functional reach to 10 " or greater for improved standing balance.   Baseline RUE 9", LUE 10"   Time 8   Period Weeks  Status New   OT LONG TERM GOAL #3   Title Pt will improve RUE box/ blocks score to 41 blocks.   Baseline baseline: RUE 38 blocks, LUE 43 blocks   Time 8   Period Weeks   Status New   OT LONG TERM GOAL #4   Title Pt will demonstrate improved ease with fasteners as evidenced  by performing 3 button/ unbutton in 26 secs or less.   Baseline 29.22 secs   Time 8   Period Weeks   Status New   OT LONG TERM GOAL #5   Title Pt will verbalize understanding of strategies to minimize freezing episodes   Time 8   Period Weeks   Status New               Plan - 08/30/15 1349    Clinical Impression Statement Pt is progressing towards goals for compensatory strategies for visual deficits and HEP.   Rehab Potential Good   OT Frequency 2x / week   OT Duration 8 weeks   OT Treatment/Interventions Self-care/ADL training;Therapeutic exercise;Patient/family education;Balance  training;Splinting;Manual Therapy;Neuromuscular education;Therapeutic exercises;Therapeutic activities;Fluidtherapy;Cognitive remediation/compensation;Visual/perceptual remediation/compensation;Passive range of motion;Moist Heat;Contrast Bath;DME and/or AE instruction;Parrafin;Cryotherapy;Energy conservation;Ultrasound   Plan dynamic reaching in standing with large amplitude movments with pt watching hand, handwriting, fasten buttons with adapted strategies   OT Home Exercise Plan Education provided:  coordination HEP; PD Handwriting tips (08/04/15) prone PWR!    Consulted and Agree with Plan of Care Patient      Patient will benefit from skilled therapeutic intervention in order to improve the following deficits and impairments:  Decreased coordination, Decreased range of motion, Decreased safety awareness, Decreased endurance, Decreased activity tolerance, Impaired tone, Impaired UE functional use, Pain, Decreased knowledge of use of DME, Decreased balance, Decreased cognition, Decreased mobility, Decreased strength, Impaired vision/preception  Visit Diagnosis: Other lack of coordination  Other symptoms and signs involving the nervous system  Other symptoms and signs involving the musculoskeletal system    Problem List Patient Active Problem List   Diagnosis Date Noted  . PSP (progressive supranuclear palsy) ([place]) 07/15/2015  . Aortic stenosis 06/09/2015  . Dysphasia 06/09/2015  . Aortic valve disorder 05/24/2014  . OSTEOPENIA 08/11/2009  . PEDAL EDEMA 07/05/2008  . BACK PAIN 12/17/2006  . Essential hypertension 11/21/2006  . Allergic rhinitis 11/21/2006  . Osteoporosis 11/21/2006  . History of colonic polyps 11/21/2006    [place] 08/30/2015, 3:22 PM [name], OTR/L [phone] Phone: [phone] 3:22 PM 08/30/2015 [facility] [address] [place], [name], [address] Phone: [phone]   Fax:   [phone]  Name: Tiffany [name] MRN: [id number] Date of Birth: 1932/05/31

## 2015-08-30 NOTE — Therapy (Signed)
[facility] [address] [place], [name], [address] Phone: [phone]   Fax:  [phone]  Speech Language Pathology Treatment  Patient Details  Name: Tiffany [name] MRN: [id number] Date of Birth: 1933/02/16 Referring Provider: Bluford Kaufmann, M.D.  Encounter Date: 08/30/2015      End of Session - 08/30/15 1444    Visit Number 11   Number of Visits 16   Date for SLP Re-Evaluation 09/20/15   Authorization Type humana   SLP Start Time 47   SLP Stop Time  7564   SLP Time Calculation (min) 44 min      Past Medical History  Diagnosis Date  . ALLERGIC RHINITIS 11/21/2006  . BACK PAIN 12/17/2006  . COLONIC POLYPS, HX OF 11/21/2006  . HYPERTENSION 11/21/2006  . OSTEOPENIA 08/11/2009  . OSTEOPOROSIS 11/21/2006  . PEDAL EDEMA 07/05/2008  . SYSTOLIC MURMUR 3/32/9518    Past Surgical History  Procedure Laterality Date  . Hemorrhoid surgery    . Tonsillectomy    . Breast surgery      bx  . Cataract extraction Bilateral 2012    There were no vitals filed for this visit.      Subjective Assessment - 08/30/15 1409    Subjective "I graduated to take my pills with water not applesauce"   Currently in Pain? No/denies               ADULT SLP TREATMENT - 08/30/15 1409    General Information   Behavior/Cognition Alert;Cooperative;Pleasant mood   Treatment Provided   Treatment provided Cognitive-Linquistic   Dysphagia Treatment   Temperature Spikes Noted No   Treatment Methods Skilled observation;Compensation strategy training   Patient observed directly with PO's Yes   Type of PO's observed Thin liquids   Other treatment/comments Initiated training for dysphagia HEP, pt required mod A for all exercises. Pt to continue to do exercises at home.    Pain Assessment   Pain Assessment No/denies pain   Cognitive-Linquistic Treatment   Treatment focused on Dysarthria   Skilled Treatment Pt utlized  compensations for dysarthria with rare min A and intelligible. Faciltated compensations for word finding difficulties in structured speech tasks with occasional mod A.    Assessment / Recommendations / Plan   Plan Continue with current plan of care   Dysphagia Recommendations   Diet recommendations Regular   Liquids provided via Cup   Medication Administration Whole meds with liquid   Postural Changes and/or Swallow Maneuvers Chin tuck   Progression Toward Goals   Progression toward goals Progressing toward goals          SLP Education - 08/30/15 1440    Education provided Yes   Education Details dysphagia HEP, compensations for aphasia,   Person(s) Educated Patient   Methods Explanation;Demonstration   Comprehension Verbalized understanding;Returned demonstration          SLP Short Term Goals - 08/24/15 1537    SLP SHORT TERM GOAL #1   Title pt will demo compensations for speech intelligibility in 10 minutes simple conversation for intelligiblity >95%   Status Achieved   SLP SHORT TERM GOAL #2   Title pt will complete HEP for dysarthria with modified independence   Time --   Period --   Status Not Met   SLP SHORT TERM GOAL #3   Title pt will demo functional verbal expression, using compensations, in 10 minutes simple conversation   Status Partially Met  2 pauses unnaturally in 10 minutes (  unable to use compensations)   SLP SHORT TERM GOAL #4   Title pt will demo appropriate tuck posture for chin down with thin liquids during sessions 95% of the time with rare min A   Time --   Period --   Status Not Met          SLP Long Term Goals - 08/30/15 1444    SLP LONG TERM GOAL #1   Title pt will complete HEP for dysphagia with modified independence   Baseline all LTGs renewed for week of 08-15-15   Time 2   Period Weeks   Status On-going   SLP LONG TERM GOAL #2   Title pt will demo compensations for speech intelligibility in 10 minutes simple to mod complex  conversation for intelligiblity >95%   Time 2   Status Achieved   SLP LONG TERM GOAL #3   Title pt will demo functional verbal expression, using compensations, in 10 minutes mod complex conversation   Time 2   Period Weeks   Status On-going   SLP LONG TERM GOAL #4   Title pt will demo compensations for dysphagia in order to decr risk of aspiration 90% of the time with POs during 3 sessions   Time 2   Period Weeks   Status On-going          Plan - 08/30/15 1441    Clinical Impression Statement Initiated training for dysphagia HEP with mod A. Pt required mod A cues for compensations for aphasia. Continue skilled ST to maximize cararyover of compensations and HEP for dysphagia and word finding.    Speech Therapy Frequency 2x / week   Duration 4 weeks   Treatment/Interventions Aspiration precaution training;Pharyngeal strengthening exercises;Diet toleration management by SLP;SLP instruction and feedback;Compensatory strategies;Internal/external aids;Patient/family education;Functional tasks;Cueing hierarchy   Potential to Achieve Goals Fair   Potential Considerations Severity of impairments;Cooperation/participation level   Consulted and Agree with Plan of Care Patient      Patient will benefit from skilled therapeutic intervention in order to improve the following deficits and impairments:   Aphasia  Dysphagia  Dysarthria    Problem List Patient Active Problem List   Diagnosis Date Noted  . PSP (progressive supranuclear palsy) ([place]) 07/15/2015  . Aortic stenosis 06/09/2015  . Dysphasia 06/09/2015  . Aortic valve disorder 05/24/2014  . OSTEOPENIA 08/11/2009  . PEDAL EDEMA 07/05/2008  . BACK PAIN 12/17/2006  . Essential hypertension 11/21/2006  . Allergic rhinitis 11/21/2006  . Osteoporosis 11/21/2006  . History of colonic polyps 11/21/2006    Lyriq Jarchow, Annye Rusk MS, CCC-SLP 08/30/2015, 2:46 PM  [facility] [address] [place], [name], [address] Phone: [phone]   Fax:  [phone]   Name: Tiffany [name] MRN: [id number] Date of Birth: 30-Mar-1932

## 2015-08-30 NOTE — Patient Instructions (Signed)
1. Look for the edge of objects (to the left and/or right) so that you make sure you are seeing all of an object 2. Turn your head when walking, scan from side to side, particularly in busy environments 3. Use an organized scanning pattern. It's usually easier to scan from top to bottom, and left to right (like you are reading), make sure you are turning your head not just moving your eyes 4. Double check yourself 5. Use a line guide (like a blank piece of paper) or your finger when reading

## 2015-09-01 ENCOUNTER — Ambulatory Visit: Payer: Commercial Managed Care - HMO | Admitting: Physical Therapy

## 2015-09-01 ENCOUNTER — Encounter: Payer: Self-pay | Admitting: Occupational Therapy

## 2015-09-01 ENCOUNTER — Ambulatory Visit: Payer: Commercial Managed Care - HMO | Admitting: Occupational Therapy

## 2015-09-01 ENCOUNTER — Ambulatory Visit: Payer: Commercial Managed Care - HMO

## 2015-09-01 DIAGNOSIS — R471 Dysarthria and anarthria: Secondary | ICD-10-CM | POA: Diagnosis not present

## 2015-09-01 DIAGNOSIS — R1310 Dysphagia, unspecified: Secondary | ICD-10-CM

## 2015-09-01 DIAGNOSIS — R2681 Unsteadiness on feet: Secondary | ICD-10-CM

## 2015-09-01 DIAGNOSIS — R279 Unspecified lack of coordination: Secondary | ICD-10-CM | POA: Diagnosis not present

## 2015-09-01 DIAGNOSIS — R29898 Other symptoms and signs involving the musculoskeletal system: Secondary | ICD-10-CM | POA: Diagnosis not present

## 2015-09-01 DIAGNOSIS — R2689 Other abnormalities of gait and mobility: Secondary | ICD-10-CM | POA: Diagnosis not present

## 2015-09-01 DIAGNOSIS — R29818 Other symptoms and signs involving the nervous system: Secondary | ICD-10-CM

## 2015-09-01 DIAGNOSIS — R278 Other lack of coordination: Secondary | ICD-10-CM

## 2015-09-01 DIAGNOSIS — R4701 Aphasia: Secondary | ICD-10-CM | POA: Diagnosis not present

## 2015-09-01 NOTE — Therapy (Signed)
[facility] [address] [place], [name], [address] Phone: [phone]   Fax:  [phone]  Occupational Therapy Treatment  Patient Details  Name: Tiffany [name] MRN: [id number] Date of Birth: 1932/05/05 Referring Provider: Dr. Carles Collet  Encounter Date: 09/01/2015      OT End of Session - 09/01/15 1404    Visit Number 12   Number of Visits 17   Date for OT Re-Evaluation 09/17/15   Authorization Type [employer]   Authorization - Visit Number 12   Authorization - Number of Visits 20   OT Start Time 1330   OT Stop Time 1400   OT Time Calculation (min) 30 min   Activity Tolerance Patient tolerated treatment well      Past Medical History  Diagnosis Date  . ALLERGIC RHINITIS 11/21/2006  . BACK PAIN 12/17/2006  . COLONIC POLYPS, HX OF 11/21/2006  . HYPERTENSION 11/21/2006  . OSTEOPENIA 08/11/2009  . OSTEOPOROSIS 11/21/2006  . PEDAL EDEMA 07/05/2008  . SYSTOLIC MURMUR [id number]    Past Surgical History  Procedure Laterality Date  . Hemorrhoid surgery    . Tonsillectomy    . Breast surgery      bx  . Cataract extraction Bilateral 2012    There were no vitals filed for this visit.      Subjective Assessment - 09/01/15 1344    Subjective  Ive been practicing my handwriting at home   Pertinent History see Epic   Patient Stated Goals handwriting, increased ease with fasteners   Currently in Pain? No/denies                      OT Treatments/Exercises (OP) - 09/01/15 0001    ADLs   UB Dressing Practiced hooking/unhooking buttons in reasonable amount of time with stratgies for large finger movement   Writing Pt practiced writing long paragraph in print maintaining letter size and 90% or greater legibility with writing strategies (built up pen, large letters, slowing down, and formulating each letter)    Neurological Re-education Exercises   Other Exercises 1 dynamic standing reaching activities  with trunk rotation bilaterally, reaching RUE and LUE and focus on large amplitude movements while following hand with head/eye turns. (Used gait belt and CGA for safety, but no LOB noted today)                   OT Short Term Goals - 08/30/15 1514    OT SHORT TERM GOAL #1   Title I with HEP. due 08/17/15   Status Achieved  needs reinforcement   OT SHORT TERM GOAL #2   Title Pt will demonstrate improved fine motor coordination as evidenced by decreasing 9 hole peg test score to 45 secs or less.   Time 4   Period Weeks   Status On-going  47.44 sec   OT SHORT TERM GOAL #3   Title Pt will verbalize understanding of adapted strategies for ADLs.   Time 4   Period Weeks   Status On-going  needs reinforcement   OT SHORT TERM GOAL #4   Title Pt will verbalize understanding of compensatory strategies for visual changes.   Time 4   Period Weeks   Status Achieved  needs reinforcement   OT SHORT TERM GOAL #5   Title Pt will write a short paragraph with 100 % legibility and minimal decrease in letter size.   Time 4   Period Weeks   Status Achieved  OT Long Term Goals - 07/19/15 1630    OT LONG TERM GOAL #1   Title Pt will demonstrate improved fine motor coordination as evidenced by decreasing RUE 9 hole peg test score to 40 secs or less.- due 09/16/15   Baseline RUE 51.69, LUE 29.53 secs   Time 8   Period Weeks   Status New   OT LONG TERM GOAL #2   Title Pt will improve RUE standing functional reach to 10 " or greater for improved standing balance.   Baseline RUE 9", LUE 10"   Time 8   Period Weeks   Status New   OT LONG TERM GOAL #3   Title Pt will improve RUE box/ blocks score to 41 blocks.   Baseline baseline: RUE 38 blocks, LUE 43 blocks   Time 8   Period Weeks   Status New   OT LONG TERM GOAL #4   Title Pt will demonstrate improved ease with fasteners as evidenced  by performing 3 button/ unbutton in 26 secs or less.   Baseline 29.22 secs   Time 8    Period Weeks   Status New   OT LONG TERM GOAL #5   Title Pt will verbalize understanding of strategies to minimize freezing episodes   Time 8   Period Weeks   Status New               Plan - 09/01/15 1404    Clinical Impression Statement Pt with improved ability to maintain letter size with writing and incorporating strategies with improved carryover. Pt demo increased balance with dynamic standing tasks   OT Frequency 2x / week   OT Duration 8 weeks   OT Treatment/Interventions Self-care/ADL training;Therapeutic exercise;Patient/family education;Balance training;Splinting;Manual Therapy;Neuromuscular education;Therapeutic exercises;Therapeutic activities;Fluidtherapy;Cognitive remediation/compensation;Visual/perceptual remediation/compensation;Passive range of motion;Moist Heat;Contrast Bath;DME and/or AE instruction;Parrafin;Cryotherapy;Energy conservation;Ultrasound   Plan continue coordination, PWR! moves   OT Home Exercise Plan Education provided:  coordination HEP; PD Handwriting tips (08/04/15) prone PWR!    Consulted and Agree with Plan of Care Patient      Patient will benefit from skilled therapeutic intervention in order to improve the following deficits and impairments:  Decreased coordination, Decreased range of motion, Decreased safety awareness, Decreased endurance, Decreased activity tolerance, Impaired tone, Impaired UE functional use, Pain, Decreased knowledge of use of DME, Decreased balance, Decreased cognition, Decreased mobility, Decreased strength, Impaired vision/preception  Visit Diagnosis: Other symptoms and signs involving the nervous system  Other lack of coordination    Problem List Patient Active Problem List   Diagnosis Date Noted  . PSP (progressive supranuclear palsy) ([place]) 07/15/2015  . Aortic stenosis 06/09/2015  . Dysphasia 06/09/2015  . Aortic valve disorder 05/24/2014  . OSTEOPENIA 08/11/2009  . PEDAL EDEMA 07/05/2008  . BACK PAIN  12/17/2006  . Essential hypertension 11/21/2006  . Allergic rhinitis 11/21/2006  . Osteoporosis 11/21/2006  . History of colonic polyps 11/21/2006    Carey Bullocks, OTR/L 09/01/2015, 2:06 PM  [facility] [address] [place], [name], [address] Phone: [phone]   Fax:  [phone]  Name: Tiffany [name] MRN: [id number] Date of Birth: 14-Dec-1932

## 2015-09-01 NOTE — Patient Instructions (Signed)
  Please complete the assigned speech therapy homework and return it to your next session.  

## 2015-09-01 NOTE — Therapy (Signed)
[facility] [address] [place], [name], [address] Phone: [phone]   Fax:  [phone]  Speech Language Pathology Treatment  Patient Details  Name: Tiffany [name] MRN: [id number] Date of Birth: Sep 27, 1932 Referring Provider: Bluford Kaufmann, M.D.  Encounter Date: 09/01/2015      End of Session - 09/01/15 1528    Visit Number 12   Number of Visits 16   Date for SLP Re-Evaluation 09/20/15   Authorization Type humana   SLP Start Time 1224   SLP Stop Time  4975   SLP Time Calculation (min) 41 min   Activity Tolerance Patient tolerated treatment well      Past Medical History  Diagnosis Date  . ALLERGIC RHINITIS 11/21/2006  . BACK PAIN 12/17/2006  . COLONIC POLYPS, HX OF 11/21/2006  . HYPERTENSION 11/21/2006  . OSTEOPENIA 08/11/2009  . OSTEOPOROSIS 11/21/2006  . PEDAL EDEMA 07/05/2008  . SYSTOLIC MURMUR 3/00/5110    Past Surgical History  Procedure Laterality Date  . Hemorrhoid surgery    . Tonsillectomy    . Breast surgery      bx  . Cataract extraction Bilateral 2012    There were no vitals filed for this visit.      Subjective Assessment - 09/01/15 1451    Subjective Pt reports going to lunch yesterday and only being asked for repeats once.   Currently in Pain? No/denies               ADULT SLP TREATMENT - 09/01/15 1457    General Information   Behavior/Cognition Alert;Cooperative;Pleasant mood   Treatment Provided   Treatment provided Cognitive-Linquistic   Dysphagia Treatment   Temperature Spikes Noted No   Treatment Methods Therapeutic exercise   Patient observed directly with PO's No   Other treatment/comments Reviewed pt's HEP for dysphagia and pt req'd min-mod A occasionally for proper procedure for HEP.    Cognitive-Linquistic Treatment   Treatment focused on Dysarthria   Skilled Treatment Pt's intelligibilty was [facility] in 10 minutes conversation. Pt noted to refrain from  using compensations during episode of anomia (7 seconds delay) . Pt reports she is "talking plainer" which she later described as being more precise with her talking and talking louder.             SLP Short Term Goals - 08/24/15 1537    SLP SHORT TERM GOAL #1   Title pt will demo compensations for speech intelligibility in 10 minutes simple conversation for intelligiblity >95%   Status Achieved   SLP SHORT TERM GOAL #2   Title pt will complete HEP for dysarthria with modified independence   Time --   Period --   Status Not Met   SLP SHORT TERM GOAL #3   Title pt will demo functional verbal expression, using compensations, in 10 minutes simple conversation   Status Partially Met  2 pauses unnaturally in 10 minutes (unable to use compensations)   SLP SHORT TERM GOAL #4   Title pt will demo appropriate tuck posture for chin down with thin liquids during sessions 95% of the time with rare min A   Time --   Period --   Status Not Met          SLP Long Term Goals - 09/01/15 1516    SLP LONG TERM GOAL #1   Title pt will complete HEP for dysphagia with rare min A   Baseline all LTGs renewed for week of 08-15-15  Time 2   Period Weeks   Status Revised   SLP LONG TERM GOAL #2   Title pt will demo compensations for speech intelligibility in 10 minutes simple to mod complex conversation for intelligiblity >95%   Status Achieved   SLP LONG TERM GOAL #3   Title pt will demo functional verbal expression, using compensations, in 10 minutes mod complex conversation   Time 2   Period Weeks   Status On-going   SLP LONG TERM GOAL #4   Title pt will demo compensations for dysphagia in order to decr risk of aspiration 90% of the time with POs during 3 sessions   Time 2   Period Weeks   Status On-going          Plan - 09/01/15 1528    Clinical Impression Statement Pt was functionally intelligible (no decr funcionally) in 10 minutes mod complex conversation.       Patient will  benefit from skilled therapeutic intervention in order to improve the following deficits and impairments:   Dysarthria and anarthria  Aphasia  Dysphagia    Problem List Patient Active Problem List   Diagnosis Date Noted  . PSP (progressive supranuclear palsy) ([place]) 07/15/2015  . Aortic stenosis 06/09/2015  . Dysphasia 06/09/2015  . Aortic valve disorder 05/24/2014  . OSTEOPENIA 08/11/2009  . PEDAL EDEMA 07/05/2008  . BACK PAIN 12/17/2006  . Essential hypertension 11/21/2006  . Allergic rhinitis 11/21/2006  . Osteoporosis 11/21/2006  . History of colonic polyps 11/21/2006    [facility] ,[place], [place]  09/01/2015, 3:33 PM  [facility] [address] [place], [name], [address] Phone: [phone]   Fax:  [phone]   Name: Tiffany [name] MRN: [id number] Date of Birth: [birth year]

## 2015-09-01 NOTE — Therapy (Signed)
[facility] [address] [place], [name], [address] Phone: [phone]   Fax:  [phone]  Physical Therapy Treatment  Patient Details  Name: Tiffany [name] MRN: [id number] Date of Birth: 05-Mar-1933 Referring Provider: Alonza Bogus, DO  Encounter Date: 09/01/2015      PT End of Session - 09/01/15 1522    Visit Number 11   Number of Visits 17   Date for PT Re-Evaluation 09/17/15   Authorization Type [employer]   Authorization Time Period G Codes and progress note required every 10 visits   PT Start Time 1405   PT Stop Time 1445   PT Time Calculation (min) 40 min   Equipment Utilized During Treatment Gait belt   Activity Tolerance Patient tolerated treatment well   Behavior During Therapy [facility] for tasks assessed/performed      Past Medical History  Diagnosis Date  . ALLERGIC RHINITIS 11/21/2006  . BACK PAIN 12/17/2006  . COLONIC POLYPS, HX OF 11/21/2006  . HYPERTENSION 11/21/2006  . OSTEOPENIA 08/11/2009  . OSTEOPOROSIS 11/21/2006  . PEDAL EDEMA 07/05/2008  . SYSTOLIC MURMUR 4/40/1027    Past Surgical History  Procedure Laterality Date  . Hemorrhoid surgery    . Tonsillectomy    . Breast surgery      bx  . Cataract extraction Bilateral 2012    There were no vitals filed for this visit.      Subjective Assessment - 09/01/15 1409    Subjective Pt sustained a fall this morning. Pt states, "After I showered, I put my foot up on the commode the dry my leg, and I feel. I know I shouldn't have done that." Pt was not injured in said fall. Denies head trauma or LOC.   Pertinent History Likes to be called "Tiffany [name]".  PMH significant for: progressive supranuclear palsy (diagnosed 07/15/15), HTN, osteopenia, aortic stenosis, bilateral cataract extraction   Patient Stated Goals "My goal is to walk better."   Currently in Pain? No/denies                         [facility] Adult PT Treatment/Exercise -  09/01/15 0001    Ambulation/Gait   Ambulation/Gait Yes   Ambulation/Gait Assistance 5: Supervision   Ambulation/Gait Assistance Details Cueing for use of compensatory strategies for impaired VOR (turning eyes, head, then body) and for visual spotting with head/body turns during gait.   Ambulation Distance (Feet) 450 Feet   Assistive device Rollator   Gait Pattern Step-through pattern;Decreased stride length;Narrow base of support   Ambulation Surface Level;Indoor   Self-Care   Self-Care Other Self-Care Comments   Other Self-Care Comments  Engaged in lengthy discussion of fall prevention strategies in home environment; see Pt Instructions for handout provided today. Specifically, discussed importance of pt avoiding standing on one leg (to dry off leg, to don/doff pants, etc), but rather to sit down in chair to perform said activities. Pt verbalized understanding.    Neuro Re-ed    Neuro Re-ed Details  Standing without UE support, pt performed the following: compensatory saccades x10 reps with visual targets 8" apart, x20 reps with visual targets 12" apart;  180-degree turns x10 reps per direction with visual spotting; 360- degree turns x3 reps per direction with visual spotting.                 PT Education - 09/01/15 1505    Education provided Yes   Education Details fall prevention strategies  in home environment   Person(s) Educated Patient   Methods Explanation;Handout   Comprehension Verbalized understanding          PT Short Term Goals - 08/30/15 1701    PT SHORT TERM GOAL #1   Title Pt will perform initial HEP independently to maximize functional gains made in PT.   (Target date: 08/16/15)   Baseline 08/16/15: with HEP issued to date   Status Achieved   PT SHORT TERM GOAL #2   Title Determine appropriate assistive device and request MD order, if necessary, to decrease fall risk.   Baseline Requested MD order for rollator on 5/19.   Status Achieved   PT SHORT TERM GOAL  #3   Title Pt will negotiate 1 step without rails with step-to pattern and mod I using LRAD to indicate pt safety using primary home entrance.   Baseline met on 08/16/15   Status Achieved   PT SHORT TERM GOAL #4   Title Pt will ambulate >300' over level, indoor surfaces with mod I using LRAD to indicate increased safety with household ambulation.    Baseline met on 08/16/15   Status Achieved   PT SHORT TERM GOAL #5   Title Pt will negotiate standard ramp and curb step with supervision using LRAD to indicate increased safety traversing commnuity obstacles.    Baseline Met 5/19.   Status Achieved   PT SHORT TERM GOAL #6   Title Pt will improve FGA score from 13/30 to >/= 16/30 to indicate decreased fall risk.   Baseline Met 5/19 with FGA  of 16/30   Status Achieved           PT Long Term Goals - 08/30/15 1659    PT LONG TERM GOAL #1   Title Pt will independently perform HEP to indicate safe daily compliance with HEP.   (Target date: 09/13/15)   Status On-going   PT LONG TERM GOAL #2   Title Pt will improve FGA score from 13/30 to 23/30 to indicate significant improvement in dynamic gait stability.    Baseline 6/6: REVISED from 19/30 to 23/30 due to pt progressing more quickly than anticipated.   Status Revised   PT LONG TERM GOAL #3   Title Pt will ambulate >500'' over unlevel, paved surfaces with mod I using LRAD to indicate increased safety with commnuity ambulation.    Status On-going   PT LONG TERM GOAL #4   Title Pt will negotiate standard ramp and curb step with mod I using LRAD to indicate increased independence traversing commnuity obstacles.    Status On-going   PT LONG TERM GOAL #5   Title Pt will verbalize plan for community exercise following episode of PT to indicate safe transition to community fitness regimen.   Status On-going               Plan - 09/01/15 1523    Clinical Impression Statement Pt sustained fall this morning when pt attempted to prop foot  on toilet to dry leg off after shower. Therefore, current session focused on discussion of fall prevention strategies. Also focused on continued practice of compensatory strategies for vestibular impairments to address continued gait instability and dizziness when looking up and turning during gait.    Rehab Potential Good   PT Frequency 2x / week   PT Duration 8 weeks   PT Treatment/Interventions ADLs/Self Care Home Management;DME Instruction;Gait training;Stair training;Neuromuscular re-education;Functional mobility training;Therapeutic activities;Therapeutic exercise;Balance training;Patient/family education   PT Next Visit Plan Continue  to work on balance with emphasis on large amplitude movements, gait with rollator and PWR! exercises.   Consulted and Agree with Plan of Care Patient      Patient will benefit from skilled therapeutic intervention in order to improve the following deficits and impairments:  Abnormal gait, Decreased balance, Decreased coordination, Postural dysfunction, Other (comment), Dizziness, Decreased knowledge of use of DME, Decreased safety awareness  Visit Diagnosis: Other abnormalities of gait and mobility  Unsteadiness on feet     Problem List Patient Active Problem List   Diagnosis Date Noted  . PSP (progressive supranuclear palsy) ([place]) 07/15/2015  . Aortic stenosis 06/09/2015  . Dysphasia 06/09/2015  . Aortic valve disorder 05/24/2014  . OSTEOPENIA 08/11/2009  . PEDAL EDEMA 07/05/2008  . BACK PAIN 12/17/2006  . Essential hypertension 11/21/2006  . Allergic rhinitis 11/21/2006  . Osteoporosis 11/21/2006  . History of colonic polyps 11/21/2006    Billie Ruddy, PT, DPT [facility] [address] [place], [name], [address] Phone: [phone]   Fax:  [phone] 09/01/2015, 3:27 PM  Name: HESPER VENTURELLA MRN: [id number] Date of Birth: 1932-10-03

## 2015-09-01 NOTE — Patient Instructions (Signed)
Fall Prevention in the Home  Falls can cause injuries and can affect people from all age groups. There are many simple things that you can do to make your home safe and to help prevent falls. WHAT CAN I DO ON THE OUTSIDE OF MY HOME?  Regularly repair the edges of walkways and driveways and fix any cracks.  Remove high doorway thresholds.  Trim any shrubbery on the main path into your home.  Use bright outdoor lighting.  Clear walkways of debris and clutter, including tools and rocks.  Regularly check that handrails are securely fastened and in good repair. Both sides of any steps should have handrails.  Install guardrails along the edges of any raised decks or porches.  Have leaves, snow, and ice cleared regularly.  Use sand or salt on walkways during winter months.  In the garage, clean up any spills right away, including grease or oil spills. WHAT CAN I DO IN THE BATHROOM?  Use night lights.  Install grab bars by the toilet and in the tub and shower. Do not use towel bars as grab bars.  Use non-skid mats or decals on the floor of the tub or shower.  If you need to sit down while you are in the shower, use a plastic, non-slip stool..  Keep the floor dry. Immediately clean up any water that spills on the floor.  Remove soap buildup in the tub or shower on a regular basis.  Attach bath mats securely with double-sided non-slip rug tape.  Remove throw rugs and other tripping hazards from the floor. WHAT CAN I DO IN THE BEDROOM?  Use night lights.  Make sure that a bedside light is easy to reach.  Do not use oversized bedding that drapes onto the floor.  Have a firm chair that has side arms to use for getting dressed.  Remove throw rugs and other tripping hazards from the floor. WHAT CAN I DO IN THE KITCHEN?   Clean up any spills right away.  Avoid walking on wet floors.  Place frequently used items in easy-to-reach places.  If you need to reach for something  above you, use a sturdy step stool that has a grab bar.  Keep electrical cables out of the way.  Do not use floor polish or wax that makes floors slippery. If you have to use wax, make sure that it is non-skid floor wax.  Remove throw rugs and other tripping hazards from the floor. WHAT CAN I DO IN THE STAIRWAYS?  Do not leave any items on the stairs.  Make sure that there are handrails on both sides of the stairs. Fix handrails that are broken or loose. Make sure that handrails are as long as the stairways.  Check any carpeting to make sure that it is firmly attached to the stairs. Fix any carpet that is loose or worn.  Avoid having throw rugs at the top or bottom of stairways, or secure the rugs with carpet tape to prevent them from moving.  Make sure that you have a light switch at the top of the stairs and the bottom of the stairs. If you do not have them, have them installed. WHAT ARE SOME OTHER FALL PREVENTION TIPS?  Wear closed-toe shoes that fit well and support your feet. Wear shoes that have rubber soles or low heels.  When you use a stepladder, make sure that it is completely opened and that the sides are firmly locked. Have someone hold the ladder while you   are using it. Do not climb a closed stepladder.  Add color or contrast paint or tape to grab bars and handrails in your home. Place contrasting color strips on the first and last steps.  Use mobility aids as needed, such as canes, walkers, scooters, and crutches.  Turn on lights if it is dark. Replace any light bulbs that burn out.  Set up furniture so that there are clear paths. Keep the furniture in the same spot.  Fix any uneven floor surfaces.  Choose a carpet design that does not hide the edge of steps of a stairway.  Be aware of any and all pets.  Review your medicines with your healthcare provider. Some medicines can cause dizziness or changes in blood pressure, which increase your risk of falling. Talk  with your health care provider about other ways that you can decrease your risk of falls. This may include working with a physical therapist or trainer to improve your strength, balance, and endurance.   This information is not intended to replace advice given to you by your health care provider. Make sure you discuss any questions you have with your health care provider.   Document Released: 03/02/2002 Document Revised: 07/27/2014 Document Reviewed: 04/16/2014 Elsevier Interactive Patient Education 2016 [employer]  

## 2015-09-05 ENCOUNTER — Ambulatory Visit: Payer: Commercial Managed Care - HMO | Admitting: Physical Therapy

## 2015-09-05 ENCOUNTER — Ambulatory Visit: Payer: Commercial Managed Care - HMO

## 2015-09-05 ENCOUNTER — Ambulatory Visit: Payer: Commercial Managed Care - HMO | Admitting: Occupational Therapy

## 2015-09-05 DIAGNOSIS — R471 Dysarthria and anarthria: Secondary | ICD-10-CM

## 2015-09-05 DIAGNOSIS — R29818 Other symptoms and signs involving the nervous system: Secondary | ICD-10-CM

## 2015-09-05 DIAGNOSIS — R2689 Other abnormalities of gait and mobility: Secondary | ICD-10-CM | POA: Diagnosis not present

## 2015-09-05 DIAGNOSIS — R4701 Aphasia: Secondary | ICD-10-CM | POA: Diagnosis not present

## 2015-09-05 DIAGNOSIS — R1310 Dysphagia, unspecified: Secondary | ICD-10-CM

## 2015-09-05 DIAGNOSIS — R279 Unspecified lack of coordination: Secondary | ICD-10-CM

## 2015-09-05 DIAGNOSIS — R29898 Other symptoms and signs involving the musculoskeletal system: Secondary | ICD-10-CM | POA: Diagnosis not present

## 2015-09-05 DIAGNOSIS — R2681 Unsteadiness on feet: Secondary | ICD-10-CM

## 2015-09-05 DIAGNOSIS — R278 Other lack of coordination: Secondary | ICD-10-CM | POA: Diagnosis not present

## 2015-09-05 NOTE — Therapy (Signed)
[facility] [address] [place], [name], [address] Phone: [phone]   Fax:  [phone]  Occupational Therapy Treatment  Patient Details  Name: Tiffany [name] MRN: [id number] Date of Birth: 1932/07/17 Referring Provider: Dr. Carles Collet  Encounter Date: 09/05/2015      OT End of Session - 09/05/15 1341    Visit Number 13   Number of Visits 17   Date for OT Re-Evaluation 09/17/15   Authorization Type [employer]   Authorization - Visit Number 13   Authorization - Number of Visits 20   OT Start Time [id number]   OT Stop Time 1448   OT Time Calculation (min) 43 min   Activity Tolerance Patient tolerated treatment well   Behavior During Therapy [facility] for tasks assessed/performed      Past Medical History  Diagnosis Date  . ALLERGIC RHINITIS 11/21/2006  . BACK PAIN 12/17/2006  . COLONIC POLYPS, HX OF 11/21/2006  . HYPERTENSION 11/21/2006  . OSTEOPENIA 08/11/2009  . OSTEOPOROSIS 11/21/2006  . PEDAL EDEMA 07/05/2008  . SYSTOLIC MURMUR [id number]    Past Surgical History  Procedure Laterality Date  . Hemorrhoid surgery    . Tonsillectomy    . Breast surgery      bx  . Cataract extraction Bilateral 2012    There were no vitals filed for this visit.      Subjective Assessment - 09/05/15 1408    Pertinent History see Epic   Patient Stated Goals handwriting, increased ease with fasteners   Currently in Pain? No/denies                      OT Treatments/Exercises (OP) - 09/05/15 0001    Fine Motor Coordination   Fine Motor Coordination Flipping cards;Grooved pegs   Flipping cards with mod cueing for large amplitude movements with each hand   Grooved pegs with R hand with use of PWR! hands for incr coordination and in-hand manipulation with min incr time/difficulty        Self Care:    Dressing:   Practiced buttoning/unbuttoning shirt on table top with min-mod cues for use of PWR! Hands/timing  prior to buttoning and use of deliberate/large amplitude movements after instruction.  Pt demo improvement with repetition and use of large amplitude movements.  Simulated ADLs with bag with focus/min-mod cues for large amplitude movements/timing:  Donning/doffing pull-over shirt, donning/doffing pants, pulling bag into hand for clothing adjustment/donning socks, donning bra/pulling shirt down in back  Functional mobility:   In standing, functional reach to place clothespins with 1-8lb resist on vertical pole using PWR! Hands/reach with min cueing for large amplitude hands, trunk rotation and step when needed (forward/back)--pt with significant difficulty coordinating stepping when needed to reach safely.   Discussed safety with ADLs.  Pt reports using seat in shower and has 2 grab bars now (walk in shower).  Emphasized importance of sitting to perform LB ADLs as pt fell trying to dry off foot by putting it up on toilet.  Pt verbalized understanding.         OT Short Term Goals - 08/30/15 1514    OT SHORT TERM GOAL #1   Title I with HEP. due 08/17/15   Status Achieved  needs reinforcement   OT SHORT TERM GOAL #2   Title Pt will demonstrate improved fine motor coordination as evidenced by decreasing 9 hole peg test score to 45 secs or less.   Time 4   Period Weeks  Status On-going  47.44 sec   OT SHORT TERM GOAL #3   Title Pt will verbalize understanding of adapted strategies for ADLs.   Time 4   Period Weeks   Status On-going  needs reinforcement   OT SHORT TERM GOAL #4   Title Pt will verbalize understanding of compensatory strategies for visual changes.   Time 4   Period Weeks   Status Achieved  needs reinforcement   OT SHORT TERM GOAL #5   Title Pt will write a short paragraph with 100 % legibility and minimal decrease in letter size.   Time 4   Period Weeks   Status Achieved           OT Long Term Goals - 07/19/15 1630    OT LONG TERM GOAL #1   Title Pt will  demonstrate improved fine motor coordination as evidenced by decreasing RUE 9 hole peg test score to 40 secs or less.- due 09/16/15   Baseline RUE 51.69, LUE 29.53 secs   Time 8   Period Weeks   Status New   OT LONG TERM GOAL #2   Title Pt will improve RUE standing functional reach to 10 " or greater for improved standing balance.   Baseline RUE 9", LUE 10"   Time 8   Period Weeks   Status New   OT LONG TERM GOAL #3   Title Pt will improve RUE box/ blocks score to 41 blocks.   Baseline baseline: RUE 38 blocks, LUE 43 blocks   Time 8   Period Weeks   Status New   OT LONG TERM GOAL #4   Title Pt will demonstrate improved ease with fasteners as evidenced  by performing 3 button/ unbutton in 26 secs or less.   Baseline 29.22 secs   Time 8   Period Weeks   Status New   OT LONG TERM GOAL #5   Title Pt will verbalize understanding of strategies to minimize freezing episodes   Time 8   Period Weeks   Status New             Patient will benefit from skilled therapeutic intervention in order to improve the following deficits and impairments:     Visit Diagnosis: Other symptoms and signs involving the nervous system  Other symptoms and signs involving the musculoskeletal system  Other lack of coordination  Unsteadiness on feet    Problem List Patient Active Problem List   Diagnosis Date Noted  . PSP (progressive supranuclear palsy) ([place]) 07/15/2015  . Aortic stenosis 06/09/2015  . Dysphasia 06/09/2015  . Aortic valve disorder 05/24/2014  . OSTEOPENIA 08/11/2009  . PEDAL EDEMA 07/05/2008  . BACK PAIN 12/17/2006  . Essential hypertension 11/21/2006  . Allergic rhinitis 11/21/2006  . Osteoporosis 11/21/2006  . History of colonic polyps 11/21/2006    [facility] 09/05/2015, 3:01 PM  [facility] [address] [place], [name], [address] Phone: [phone]   Fax:  [phone]  Name: Tiffany [name] MRN: [id number] Date of Birth: 02-11-1933  Vianne Bulls, OTR/L [facility] [address] [place], [place]  [address] [phone] phone [phone] 09/05/2015 3:01 PM

## 2015-09-05 NOTE — Therapy (Signed)
[facility] [address] [place], [name], [address] Phone: [phone]   Fax:  [phone]  Physical Therapy Treatment  Patient Details  Name: Tiffany [name] MRN: [id number] Date of Birth: Aug 21, 1932 Referring Provider: Alonza Bogus, DO  Encounter Date: 09/05/2015      PT End of Session - 09/05/15 1742    Visit Number 12   Number of Visits 17   Date for PT Re-Evaluation 09/17/15   Authorization Type [employer]   Authorization Time Period G Codes and progress note required every 10 visits   PT Start Time 1316   PT Stop Time 1400   PT Time Calculation (min) 44 min   Activity Tolerance Patient tolerated treatment well   Behavior During Therapy [facility] for tasks assessed/performed      Past Medical History  Diagnosis Date  . ALLERGIC RHINITIS 11/21/2006  . BACK PAIN 12/17/2006  . COLONIC POLYPS, HX OF 11/21/2006  . HYPERTENSION 11/21/2006  . OSTEOPENIA 08/11/2009  . OSTEOPOROSIS 11/21/2006  . PEDAL EDEMA 07/05/2008  . SYSTOLIC MURMUR 3/64/6803    Past Surgical History  Procedure Laterality Date  . Hemorrhoid surgery    . Tonsillectomy    . Breast surgery      bx  . Cataract extraction Bilateral 2012    There were no vitals filed for this visit.      Subjective Assessment - 09/05/15 1322    Subjective Pt reports no falls and no signifiant changes. When asked about dizziness with head turns, pt replies, "Nope, no dizziness."   Pertinent History Likes to be called "Tiffany [name]".  PMH significant for: progressive supranuclear palsy (diagnosed 07/15/15), HTN, osteopenia, aortic stenosis, bilateral cataract extraction   Patient Stated Goals "My goal is to walk better."   Currently in Pain? No/denies                         [facility] Adult PT Treatment/Exercise - 09/05/15 0001    Transfers   Transfers Sit to Stand;Stand to Sit   Sit to Stand 5: Supervision;6: Modified independent (Device/Increase time)   Sit to Stand Details (indicate cue type and reason) Verbal reminder initially required for proper use of rollator during transfers with effective within-session carryover.   Stand to Sit 6: Modified independent (Device/Increase time)   Ambulation/Gait   Ambulation/Gait Yes   Ambulation/Gait Assistance 6: Modified independent (Device/Increase time);5: Supervision   Ambulation/Gait Assistance Details Mod I for gait x300' over level, indoor surfaces with rollator. Gait x120' over unlevel, paved surfaces with (S) for slow, controlled descent, use of hand brekas when ambulating down decline.   Ambulation Distance (Feet) 420 Feet   Assistive device Rollator   Gait Pattern Step-through pattern;Decreased stride length   Ambulation Surface Level;Unlevel;Indoor;Outdoor;Paved   Door Management 5: Supervision;4: Min assist   Door Managment Details (indicate cue type and reason) Initially required min A, max cueing for safe door management with rollator. Pt safest when placing back against open door, taking 2-3 steps backwards using rollator, then turning 180 degrees to ambulate through door. Pt gave effective return demo.   Ramp 5: Supervision   Ramp Details (indicate cue type and reason) Cueing for use of rollator hand breaks to slow descent.   Curb 5: Supervision   Curb Details (indicate cue type and reason) Cueing for safe technique using rollator           PWR [facility]) - 09/05/15 1742    PWR! Up x20  PWR! [employer]! Twist x20   PWR Step x20   Comments With PWR!Up, requires cueing for postural awareness; during PWR! Rock, cueing focued on full lateral weight shift, trunk elongation. PWR! Twist: max verbal/demo cueing for pausing at each movement due to pt tendency toward one continuous movement. PWR! Step: cueing focused on larger amplitude step, full weight shift, and incorporation of BUE's.   PWR! Up x20   PWR! Rock [employer]! Twist x20   PWR! Step x20   Comments Pt continues to require  verbal/demo cueing for finite endpoint of movement, pause during PWR! Twist. Again, utilized hurdle to increase height/width of LE advancement during PWR! Step transition.              PT Education - 09/05/15 1741    Education provided Yes   Education Details Reviewed seated PWR! exercises.   Person(s) Educated Patient   Methods Explanation;Demonstration;Verbal cues   Comprehension Verbalized understanding;Returned demonstration          PT Short Term Goals - 08/30/15 1701    PT SHORT TERM GOAL #1   Title Pt will perform initial HEP independently to maximize functional gains made in PT.   (Target date: 08/16/15)   Baseline 08/16/15: with HEP issued to date   Status Achieved   PT SHORT TERM GOAL #2   Title Determine appropriate assistive device and request MD order, if necessary, to decrease fall risk.   Baseline Requested MD order for rollator on 5/19.   Status Achieved   PT SHORT TERM GOAL #3   Title Pt will negotiate 1 step without rails with step-to pattern and mod I using LRAD to indicate pt safety using primary home entrance.   Baseline met on 08/16/15   Status Achieved   PT SHORT TERM GOAL #4   Title Pt will ambulate >300' over level, indoor surfaces with mod I using LRAD to indicate increased safety with household ambulation.    Baseline met on 08/16/15   Status Achieved   PT SHORT TERM GOAL #5   Title Pt will negotiate standard ramp and curb step with supervision using LRAD to indicate increased safety traversing commnuity obstacles.    Baseline Met 5/19.   Status Achieved   PT SHORT TERM GOAL #6   Title Pt will improve FGA score from 13/30 to >/= 16/30 to indicate decreased fall risk.   Baseline Met 5/19 with FGA  of 16/30   Status Achieved           PT Long Term Goals - 08/30/15 1659    PT LONG TERM GOAL #1   Title Pt will independently perform HEP to indicate safe daily compliance with HEP.   (Target date: 09/13/15)   Status On-going   PT LONG TERM GOAL #2    Title Pt will improve FGA score from 13/30 to 23/30 to indicate significant improvement in dynamic gait stability.    Baseline 6/6: REVISED from 19/30 to 23/30 due to pt progressing more quickly than anticipated.   Status Revised   PT LONG TERM GOAL #3   Title Pt will ambulate >500'' over unlevel, paved surfaces with mod I using LRAD to indicate increased safety with commnuity ambulation.    Status On-going   PT LONG TERM GOAL #4   Title Pt will negotiate standard ramp and curb step with mod I using LRAD to indicate increased independence traversing commnuity obstacles.    Status On-going   PT LONG TERM  GOAL #5   Title Pt will verbalize plan for community exercise following episode of PT to indicate safe transition to community fitness regimen.   Status On-going               Plan - 09/05/15 1753    Clinical Impression Statement Session focused on reviewing seated PWR! and beginning standing PWR! Max multimodal cueing during standing PWR! exercises emphasized pausing at end point of each PWR! move.    Rehab Potential Good   PT Frequency 2x / week   PT Duration 8 weeks   PT Next Visit Plan Continue to work on balance with emphasis on large amplitude movements, gait with rollator and PWR! exercises.   Consulted and Agree with Plan of Care Patient      Patient will benefit from skilled therapeutic intervention in order to improve the following deficits and impairments:  Abnormal gait, Decreased balance, Decreased coordination, Postural dysfunction, Other (comment), Dizziness, Decreased knowledge of use of DME, Decreased safety awareness  Visit Diagnosis: Other abnormalities of gait and mobility  Unsteadiness on feet  Unspecified lack of coordination     Problem List Patient Active Problem List   Diagnosis Date Noted  . PSP (progressive supranuclear palsy) ([place]) 07/15/2015  . Aortic stenosis 06/09/2015  . Dysphasia 06/09/2015  . Aortic valve disorder 05/24/2014  .  OSTEOPENIA 08/11/2009  . PEDAL EDEMA 07/05/2008  . BACK PAIN 12/17/2006  . Essential hypertension 11/21/2006  . Allergic rhinitis 11/21/2006  . Osteoporosis 11/21/2006  . History of colonic polyps 11/21/2006    ,Billie Ruddy, PT, DPT [facility] [address] [place], [name], [address] Phone: [phone]   Fax:  [phone] 09/05/2015, 5:55 PM  Name: GAYLENE MOYLAN MRN: [id number] Date of Birth: November 16, 1932

## 2015-09-05 NOTE — Therapy (Signed)
[facility] [address] [place], [name], [address] Phone: [phone]   Fax:  [phone]  Speech Language Pathology Treatment  Patient Details  Name: Tiffany [name] MRN: [id number] Date of Birth: [birth year] Referring Provider: Bluford Kaufmann, M.D.  Encounter Date: 09/05/2015      End of Session - 09/05/15 1532    Visit Number 13   Number of Visits 16   Date for SLP Re-Evaluation 09/20/15   Authorization Type [employer]   SLP Start Time 1450   SLP Stop Time  1530   SLP Time Calculation (min) 40 min   Activity Tolerance Patient tolerated treatment well      Past Medical History  Diagnosis Date  . ALLERGIC RHINITIS 11/21/2006  . BACK PAIN 12/17/2006  . COLONIC POLYPS, HX OF 11/21/2006  . HYPERTENSION 11/21/2006  . OSTEOPENIA 08/11/2009  . OSTEOPOROSIS 11/21/2006  . PEDAL EDEMA 07/05/2008  . SYSTOLIC MURMUR 6/96/2952    Past Surgical History  Procedure Laterality Date  . Hemorrhoid surgery    . Tonsillectomy    . Breast surgery      bx  . Cataract extraction Bilateral 2012    There were no vitals filed for this visit.      Subjective Assessment - 09/05/15 1454    Subjective Pt reports performing HEP a few times a week. SLP reiterated to pt to complete x2/day for best opportunity for improvement.               ADULT SLP TREATMENT - 09/05/15 1456    General Information   Behavior/Cognition Alert;Cooperative;Pleasant mood   Treatment Provided   Treatment provided Dysphagia   Dysphagia Treatment   Temperature Spikes Noted No   Treatment Methods Therapeutic exercise;Compensation strategy training   Patient observed directly with PO's Yes   Other treatment/comments SLP ascertained pt completing HEP only 2-3 times per week, per her report - so actual completion may be less, as pt read each exercise out loud prior to completion. SLP reitereated to pt she needed to complete the exercises as  prescribed to ensure food and liquid would not enter her lungs. Pt demonstrated understanding. Pt req'd SLP min-mod A usually for proper completion. WIth POs, pt tucked chin 70% of the time with SLP infront of her, and SLP had to cue pt consistently to tuck chin fully adn not just tilt head. Pt responded "Because I don't want to swallow my tongue" when asked why she was completing the exercises at the end of the session.   Cognitive-Linquistic Treatment   Treatment focused on Dysarthria   Skilled Treatment Pt's intelligibilty was [facility] in 8 minutes conversation of mod complexity. In strucutred therapy tasks (sentences/multisentences) pt maintained good (not excellent) intelligibility throughout the task.     Assessment / Recommendations / Plan   Plan Continue with current plan of care   Dysphagia Recommendations   Diet recommendations Regular;Thin liquid   Liquids provided via Cup   Medication Administration Whole meds with liquid   Postural Changes and/or Swallow Maneuvers Chin tuck          SLP Education - 09/05/15 1530    Education provided Yes   Education Details rationale for HEP for dysphagia, prescribed frequency   Person(s) Educated Patient   Methods Explanation;Handout;Demonstration   Comprehension Verbalized understanding;Returned demonstration;Verbal cues required;Need further instruction          SLP Short Term Goals - 08/24/15 1537    SLP SHORT TERM GOAL #1  Title pt will demo compensations for speech intelligibility in 10 minutes simple conversation for intelligiblity >95%   Status Achieved   SLP SHORT TERM GOAL #2   Title pt will complete HEP for dysarthria with modified independence   Time --   Period --   Status Not Met   SLP SHORT TERM GOAL #3   Title pt will demo functional verbal expression, using compensations, in 10 minutes simple conversation   Status Partially Met  2 pauses unnaturally in 10 minutes (unable to use compensations)   SLP SHORT TERM GOAL #4    Title pt will demo appropriate tuck posture for chin down with thin liquids during sessions 95% of the time with rare min A   Time --   Period --   Status Not Met          SLP Long Term Goals - 09/05/15 1535    SLP LONG TERM GOAL #1   Title pt will complete HEP for dysphagia with rare min A   Baseline all LTGs renewed for week of 08-15-15   Time 1   Period Weeks   Status Revised   SLP LONG TERM GOAL #2   Title pt will demo compensations for speech intelligibility in 10 minutes simple to mod complex conversation for intelligiblity >95%   Status Achieved   SLP LONG TERM GOAL #3   Title pt will demo functional verbal expression, using compensations, in 10 minutes mod complex conversation   Time 1   Period Weeks   Status On-going   SLP LONG TERM GOAL #4   Title pt will demo compensations for dysphagia in order to decr risk of aspiration 90% of the time with POs during 3 sessions   Time 1   Period Weeks   Status On-going          Plan - 09/05/15 1532    Clinical Impression Statement Dysphagia HEP completed with usual  SLP A needed. Pt demo'd functional intelligibility with 8 minutes conversation. Continue skilled ST to maximize cararyover of compensations for dysarthria, swallowing, and completion of HEP for dysphagia.   Speech Therapy Frequency 2x / week   Duration 1 week   Treatment/Interventions Aspiration precaution training;Pharyngeal strengthening exercises;Diet toleration management by SLP;SLP instruction and feedback;Compensatory strategies;Internal/external aids;Patient/family education;Functional tasks;Cueing hierarchy   Potential to Achieve Goals Fair   Potential Considerations Severity of impairments;Cooperation/participation level   Consulted and Agree with Plan of Care Patient      Patient will benefit from skilled therapeutic intervention in order to improve the following deficits and impairments:   Dysarthria and  anarthria  Aphasia  Dysphagia    Problem List Patient Active Problem List   Diagnosis Date Noted  . PSP (progressive supranuclear palsy) ([place]) 07/15/2015  . Aortic stenosis 06/09/2015  . Dysphasia 06/09/2015  . Aortic valve disorder 05/24/2014  . OSTEOPENIA 08/11/2009  . PEDAL EDEMA 07/05/2008  . BACK PAIN 12/17/2006  . Essential hypertension 11/21/2006  . Allergic rhinitis 11/21/2006  . Osteoporosis 11/21/2006  . History of colonic polyps 11/21/2006    [facility] ,[place], [place]   09/05/2015, 3:39 PM  [facility] [address] [place], [name], [address] Phone: [phone]   Fax:  [phone]   Name: Tiffany [name] MRN: [id number] Date of Birth: Nov 22, 1932

## 2015-09-06 ENCOUNTER — Ambulatory Visit (INDEPENDENT_AMBULATORY_CARE_PROVIDER_SITE_OTHER): Payer: Commercial Managed Care - HMO | Admitting: Internal Medicine

## 2015-09-06 ENCOUNTER — Encounter: Payer: Self-pay | Admitting: Internal Medicine

## 2015-09-06 VITALS — BP 130/72 | HR 90 | Temp 98.2°F | Ht 64.0 in | Wt 152.0 lb

## 2015-09-06 DIAGNOSIS — R4702 Dysphasia: Secondary | ICD-10-CM | POA: Diagnosis not present

## 2015-09-06 DIAGNOSIS — I350 Nonrheumatic aortic (valve) stenosis: Secondary | ICD-10-CM | POA: Diagnosis not present

## 2015-09-06 DIAGNOSIS — I10 Essential (primary) hypertension: Secondary | ICD-10-CM

## 2015-09-06 DIAGNOSIS — G231 Progressive supranuclear ophthalmoplegia [Steele-Richardson-Olszewski]: Secondary | ICD-10-CM

## 2015-09-06 NOTE — Progress Notes (Signed)
Pre visit review using our clinic review tool, if applicable. No additional management support is needed unless otherwise documented below in the visit note. 

## 2015-09-06 NOTE — Patient Instructions (Signed)
Limit your sodium (Salt) intake  Neurology and cardiology follow-up  Return in 6 months for follow-up

## 2015-09-06 NOTE — Progress Notes (Signed)
Subjective:    Patient ID: Tiffany [name], female    DOB: [birth year], 82 y.o.   MRN: [id number]  HPI 82 year old patient who is seen for follow-up.  She has essential hypertension and is followed by cardiology with moderate aortic stenosis. She has a history of probable PSP and is scheduled for neurology follow-up in 2 weeks.  She continues to participate in PT, OT and speech therapy.  She continues to be bothered by speech impairment and now uses a walker.  Gait is quite unsteady with a tendency to fall backwards.  She has had 2 falls. She is requesting a letter to have her trash picked up closer to her home.  We'll also provide patient with a disability placard  Past Medical History  Diagnosis Date  . ALLERGIC RHINITIS 11/21/2006  . BACK PAIN 12/17/2006  . COLONIC POLYPS, HX OF 11/21/2006  . HYPERTENSION 11/21/2006  . OSTEOPENIA 08/11/2009  . OSTEOPOROSIS 11/21/2006  . PEDAL EDEMA 07/05/2008  . SYSTOLIC MURMUR [id number]     Social History   Social History  . Marital Status: Single    Spouse Name: N/A  . Number of Children: N/A  . Years of Education: N/A   Occupational History  . retired     Press photographer   Social History Main Topics  . Smoking status: Former Smoker    Quit date: 03/27/1995  . Smokeless tobacco: Never Used  . Alcohol Use: No  . Drug Use: No  . Sexual Activity: Not on file   Other Topics Concern  . Not on file   Social History Narrative    Past Surgical History  Procedure Laterality Date  . Hemorrhoid surgery    . Tonsillectomy    . Breast surgery      bx  . Cataract extraction Bilateral 2012    No family history on file.  Allergies  Allergen Reactions  . Codeine Phosphate     Current Outpatient Prescriptions on File Prior to Visit  Medication Sig Dispense Refill  . amLODipine (NORVASC) 2.5 MG tablet Take 1 tablet (2.5 mg total) by mouth daily. 90 tablet 3  . aspirin 81 MG tablet Take 81 mg by mouth daily.      Marland Kitchen atorvastatin (LIPITOR) 10 MG  tablet Take 1 tablet (10 mg total) by mouth daily. 90 tablet 3  . benazepril (LOTENSIN) 40 MG tablet TAKE 1 TABLET EVERY DAY 90 tablet 1  . Calcium Carbonate (CALTRATE 600) 1500 MG TABS Take by mouth 2 (two) times daily.      . carbidopa-levodopa (SINEMET IR) 25-100 MG tablet Take 1 tablet by mouth 3 (three) times daily. 270 tablet 1  . Cholecalciferol (VITAMIN D) 1000 UNITS capsule Take 1,000 Units by mouth daily.      . hydrochlorothiazide (HYDRODIURIL) 25 MG tablet TAKE 1 TABLET EVERY DAY 90 tablet 1  . Misc. Devices (ROLLATOR) MISC 1 Device by Does not apply route daily. 1 each 0  . Multiple Vitamin (MULTIVITAMIN) tablet Take 1 tablet by mouth daily.      . Omega-3 Fatty Acids (FISH OIL) 1200 MG CAPS Take 1 capsule by mouth daily.     No current facility-administered medications on file prior to visit.    BP 130/72 mmHg  Pulse 90  Temp(Src) 98.2 F (36.8 C) (Oral)  Ht 5\' 4"  (1.626 m)  Wt 152 lb (68.947 kg)  BMI 26.08 kg/m2  SpO2 97%      Review of Systems  HENT: Negative for congestion, dental problem,  hearing loss, rhinorrhea, sinus pressure, sore throat and tinnitus.   Eyes: Negative for pain, discharge and visual disturbance.  Respiratory: Negative for cough and shortness of breath.   Cardiovascular: Negative for chest pain, palpitations and leg swelling.  Gastrointestinal: Negative for nausea, vomiting, abdominal pain, diarrhea, constipation, blood in stool and abdominal distention.  Genitourinary: Negative for dysuria, urgency, frequency, hematuria, flank pain, vaginal bleeding, vaginal discharge, difficulty urinating, vaginal pain and pelvic pain.  Musculoskeletal: Positive for gait problem. Negative for joint swelling and arthralgias.  Skin: Negative for rash.  Neurological: Positive for dizziness, speech difficulty and weakness. Negative for syncope, numbness and headaches.  Hematological: Negative for adenopathy.  Psychiatric/Behavioral: Negative for behavioral  problems, dysphoric mood and agitation. The patient is not nervous/anxious.        Objective:   Physical Exam  Constitutional: She is oriented to person, place, and time. She appears well-developed and well-nourished.  Blood pressure 130/70  HENT:  Head: Normocephalic.  Right Ear: External ear normal.  Left Ear: External ear normal.  Mouth/Throat: Oropharynx is clear and moist.  Eyes: Conjunctivae and EOM are normal. Pupils are equal, round, and reactive to light.  Neck: Normal range of motion. Neck supple. No thyromegaly present.  Cardiovascular: Normal rate, regular rhythm and intact distal pulses.   Murmur heard. Grade 3-/6 systolic murmur with radiation to the carotids  Pulmonary/Chest: Effort normal and breath sounds normal.  Abdominal: Soft. Bowel sounds are normal. She exhibits no mass. There is no tenderness.  Musculoskeletal: Normal range of motion.  Lymphadenopathy:    She has no cervical adenopathy.  Neurological: She is alert and oriented to person, place, and time.  Speech impairment Able to stand easily from a sitting position Unsteady gait  Skin: Skin is warm and dry. No rash noted.  Psychiatric: She has a normal mood and affect. Her behavior is normal.          Assessment & Plan:   Essential hypertension, controlled Moderate aortic stenosis Probable PSP.  Follow-up neurology  No change in medical regimen Letter dictated for more convenient trash pickup Handicap placard dispensed  Recheck 6 months or as needed  Nyoka Cowden, MD

## 2015-09-08 ENCOUNTER — Ambulatory Visit: Payer: Commercial Managed Care - HMO | Admitting: Physical Therapy

## 2015-09-08 ENCOUNTER — Ambulatory Visit: Payer: Commercial Managed Care - HMO

## 2015-09-08 ENCOUNTER — Ambulatory Visit: Payer: Commercial Managed Care - HMO | Admitting: Occupational Therapy

## 2015-09-08 DIAGNOSIS — R2681 Unsteadiness on feet: Secondary | ICD-10-CM

## 2015-09-08 DIAGNOSIS — R471 Dysarthria and anarthria: Secondary | ICD-10-CM

## 2015-09-08 DIAGNOSIS — R29898 Other symptoms and signs involving the musculoskeletal system: Secondary | ICD-10-CM

## 2015-09-08 DIAGNOSIS — R1310 Dysphagia, unspecified: Secondary | ICD-10-CM | POA: Diagnosis not present

## 2015-09-08 DIAGNOSIS — R278 Other lack of coordination: Secondary | ICD-10-CM | POA: Diagnosis not present

## 2015-09-08 DIAGNOSIS — R2689 Other abnormalities of gait and mobility: Secondary | ICD-10-CM

## 2015-09-08 DIAGNOSIS — R29818 Other symptoms and signs involving the nervous system: Secondary | ICD-10-CM

## 2015-09-08 DIAGNOSIS — R4701 Aphasia: Secondary | ICD-10-CM | POA: Diagnosis not present

## 2015-09-08 DIAGNOSIS — R279 Unspecified lack of coordination: Secondary | ICD-10-CM | POA: Diagnosis not present

## 2015-09-08 NOTE — Therapy (Signed)
[facility] [address] [place], [name], [address] Phone: [phone]   Fax:  [phone]  Occupational Therapy Treatment  Patient Details  Name: Tiffany [name] MRN: [id number] Date of Birth: 02-15-1933 Referring Provider: Dr. Carles Collet  Encounter Date: 09/08/2015      OT End of Session - 09/08/15 1509    Visit Number 14   Number of Visits 17   Date for OT Re-Evaluation 09/17/15   Authorization Type [employer]   Authorization - Visit Number 14   Authorization - Number of Visits 20   OT Start Time 5993   OT Stop Time 1445   OT Time Calculation (min) 40 min      Past Medical History  Diagnosis Date  . ALLERGIC RHINITIS 11/21/2006  . BACK PAIN 12/17/2006  . COLONIC POLYPS, HX OF 11/21/2006  . HYPERTENSION 11/21/2006  . OSTEOPENIA 08/11/2009  . OSTEOPOROSIS 11/21/2006  . PEDAL EDEMA 07/05/2008  . SYSTOLIC MURMUR 5/70/1779    Past Surgical History  Procedure Laterality Date  . Hemorrhoid surgery    . Tonsillectomy    . Breast surgery      bx  . Cataract extraction Bilateral 2012    There were no vitals filed for this visit.      Subjective Assessment - 09/08/15 1406    Subjective  I see Dr. Carles Collet on June 27th   Pertinent History see Epic   Patient Stated Goals handwriting, increased ease with fasteners   Currently in Pain? No/denies            Self Care:      Practiced buttoning/unbuttoning shirt on table top with min v. cues for use of PWR! Hands/timing prior to buttoning and use of deliberate/large amplitude movements after instruction.  Pt demo improvement with repetition and use of large amplitude movements.  Simulated ADLs with bag with min-mod cues for large amplitude movements/timing:  Donning/doffing pull-over shirt, donning/doffing pants, pulling bag into hand for clothing adjustment/donning socks, donning bra/pulling shirt down in back Therapist  started checking progress towards gals.  Pt was provided with information regarding lifeline for safety. Therapist  discussed strategies to compensate for vision such as head turns when walking and watching hands during exercise. Pt verbalized understanding yet she can benefit from review. In standing, functional reach to place large pegs in vertical pegboard  with min cueing for large amplitude hands, trunk rotation and step when needed --pt demonstrates continued  dificulty coordinating stepping when needed to reach safely.     Emphasized importance of sitting to perform LB ADLs  and sitting down in chair to retrieve and item off the floor and holding onto the countertop when reaching. Pt verbalized understanding.                                 OT Education - 09/08/15 1507    Education provided Yes   Education Details ADL strategies, tips to prevent freezing episodes with handout,    Person(s) Educated Patient   Methods Explanation;Demonstration;Verbal cues   Comprehension Verbalized understanding;Returned demonstration;Verbal cues required          OT Short Term Goals - 09/08/15 1426    OT SHORT TERM GOAL #1   Title I with HEP. due 08/17/15   Status Achieved   OT SHORT TERM GOAL #2   Title Pt will demonstrate improved fine motor coordination as evidenced by decreasing 9 hole  peg test score to 45 secs or less.   Time 4   Period Weeks   Status Achieved  37.69 secs   OT SHORT TERM GOAL #3   Title Pt will verbalize understanding of adapted strategies for ADLs.   Time 4   Period Weeks   Status Achieved   OT SHORT TERM GOAL #4   Title Pt will verbalize understanding of compensatory strategies for visual changes.   Time 4   Period Weeks   Status Achieved  needs reinforcement   OT SHORT TERM GOAL #5   Title Pt will write a short paragraph with 100 % legibility and minimal decrease in letter size.   Time 4   Period Weeks   Status Achieved           OT Long Term Goals - 09/08/15 1422    OT  LONG TERM GOAL #1   Title Pt will demonstrate improved fine motor coordination as evidenced by decreasing RUE 9 hole peg test score to 40 secs or less.- due 09/16/15   Baseline RUE 51.69, LUE 29.53 secs   Time 8   Period Weeks   Status Achieved  37.69 secs   OT LONG TERM GOAL #2   Title Pt will improve RUE standing functional reach to 10 " or greater for improved standing balance.   Baseline RUE 9", LUE 10"   Time 8   Period Weeks   Status On-going   OT LONG TERM GOAL #3   Title Pt will improve RUE box/ blocks score to 41 blocks.   Baseline baseline: RUE 38 blocks, LUE 43 blocks   Time 8   Period Weeks   Status On-going   OT LONG TERM GOAL #4   Title Pt will demonstrate improved ease with fasteners as evidenced  by performing 3 button/ unbutton in 26 secs or less.   Baseline 29.22 secs   Time 8   Period Weeks   Status Not Met  28.88 secs   OT LONG TERM GOAL #5   Title Pt will verbalize understanding of strategies to minimize freezing episodes   Time 8   Period Weeks   Status On-going               Plan - 09/08/15 1503    Clinical Impression Statement Pt is progressing towards goals. she demonstrates improved carryover of ADL strategies. Pt reports she would like to d/c next week.   Rehab Potential Good   OT Frequency 2x / week   OT Duration 8 weeks   OT Treatment/Interventions Self-care/ADL training;Therapeutic exercise;Patient/family education;Balance training;Splinting;Manual Therapy;Neuromuscular education;Therapeutic exercises;Therapeutic activities;Fluidtherapy;Cognitive remediation/compensation;Visual/perceptual remediation/compensation;Passive range of motion;Moist Heat;Contrast Bath;DME and/or AE instruction;Parrafin;Cryotherapy;Energy conservation;Ultrasound   Plan review PWR ! moves supine, modified quadraped, check goals andticpate d/c, re-eval in 6 months   OT Home Exercise Plan Education provided:  coordination HEP; PD Handwriting tips (08/04/15) prone PWR!  , modified quadraped, tips to prevent freezing episodes   Consulted and Agree with Plan of Care Patient      Patient will benefit from skilled therapeutic intervention in order to improve the following deficits and impairments:  Decreased coordination, Decreased range of motion, Decreased safety awareness, Decreased endurance, Decreased activity tolerance, Impaired tone, Impaired UE functional use, Pain, Decreased knowledge of use of DME, Decreased balance, Decreased cognition, Decreased mobility, Decreased strength, Impaired vision/preception  Visit Diagnosis: Other symptoms and signs involving the nervous system  Other symptoms and signs involving the musculoskeletal system  Other lack of coordination  Unsteadiness  on feet  Other abnormalities of gait and mobility    Problem List Patient Active Problem List   Diagnosis Date Noted  . PSP (progressive supranuclear palsy) ([place]) 07/15/2015  . Aortic stenosis 06/09/2015  . Dysphasia 06/09/2015  . Aortic valve disorder 05/24/2014  . OSTEOPENIA 08/11/2009  . PEDAL EDEMA 07/05/2008  . BACK PAIN 12/17/2006  . Essential hypertension 11/21/2006  . Allergic rhinitis 11/21/2006  . Osteoporosis 11/21/2006  . History of colonic polyps 11/21/2006    Pearlean Sabina 09/08/2015, 3:10 PM [name], OTR/L [phone] Phone: [phone] 3:10 PM 09/08/2015 [employer] [address] [place], [name], [address] Phone: [phone]   Fax:  [phone]  Name: Tiffany [name] MRN: [id number] Date of Birth: 1932/06/22

## 2015-09-08 NOTE — Therapy (Signed)
[facility] [address] [place], [name], [address] Phone: [phone]   Fax:  [phone]  Speech Language Pathology Treatment  Patient Details  Name: Tiffany [name] MRN: [id number] Date of Birth: 04-Nov-1932 Referring Provider: Bluford Kaufmann, M.D.  Encounter Date: 09/08/2015      End of Session - 09/08/15 1533    Visit Number 14   Number of Visits 16   Date for SLP Re-Evaluation 09/20/15   Authorization Type humana   SLP Start Time 3662   SLP Stop Time  9476   SLP Time Calculation (min) 41 min   Activity Tolerance Patient tolerated treatment well      Past Medical History  Diagnosis Date  . ALLERGIC RHINITIS 11/21/2006  . BACK PAIN 12/17/2006  . COLONIC POLYPS, HX OF 11/21/2006  . HYPERTENSION 11/21/2006  . OSTEOPENIA 08/11/2009  . OSTEOPOROSIS 11/21/2006  . PEDAL EDEMA 07/05/2008  . SYSTOLIC MURMUR 5/46/5035    Past Surgical History  Procedure Laterality Date  . Hemorrhoid surgery    . Tonsillectomy    . Breast surgery      bx  . Cataract extraction Bilateral 2012    There were no vitals filed for this visit.      Subjective Assessment - 09/08/15 1455    Subjective "To strengthen my muscles" (pt, re: why she is doing HEP for dysphagia)   Currently in Pain? No/denies               ADULT SLP TREATMENT - 09/08/15 1456    General Information   Behavior/Cognition Alert;Cooperative;Pleasant mood   Treatment Provided   Treatment provided Dysphagia;Cognitive-Linquistic   Dysphagia Treatment   Temperature Spikes Noted No   Treatment Methods Compensation strategy training   Patient observed directly with PO's Yes   Type of PO's observed Thin liquids   Other treatment/comments SLP verified pt DOES know why she is completing the HEP, and reiterated this to pt after her "S" statement. She told SLP "I'll get pneumonia if it doesn't get better." Without SLP cues pt placed chin down 90% today.     Cognitive-Linquistic Treatment   Treatment focused on Dysarthria   Skilled Treatment SLP faciliatated more intelligible speech from structured tasks - success 85%. In simple-mod complex conversation, pt req'd min-mod A rarely in simple to mod compelx conversation for overarticuation.    Assessment / Recommendations / Plan   Plan Continue with current plan of care  d/c next session - pt agreed   Dysphagia Recommendations   Diet recommendations Regular;Thin liquid   Liquids provided via Cup   Medication Administration Whole meds with liquid   Postural Changes and/or Swallow Maneuvers Chin tuck            SLP Short Term Goals - 08/24/15 1537    SLP SHORT TERM GOAL #1   Title pt will demo compensations for speech intelligibility in 10 minutes simple conversation for intelligiblity >95%   Status Achieved   SLP SHORT TERM GOAL #2   Title pt will complete HEP for dysarthria with modified independence   Time --   Period --   Status Not Met   SLP SHORT TERM GOAL #3   Title pt will demo functional verbal expression, using compensations, in 10 minutes simple conversation   Status Partially Met  2 pauses unnaturally in 10 minutes (unable to use compensations)   SLP SHORT TERM GOAL #4   Title pt will demo appropriate tuck posture for chin down with  thin liquids during sessions 95% of the time with rare min A   Time --   Period --   Status Not Met          SLP Long Term Goals - 09/08/15 1536    SLP LONG TERM GOAL #1   Title pt will complete HEP for dysphagia with rare min A   Baseline all LTGs renewed for week of 08-15-15   Status Not Met   SLP LONG TERM GOAL #2   Title pt will demo compensations for speech intelligibility in 10 minutes simple to mod complex conversation for intelligiblity >95%   Status Achieved   SLP LONG TERM GOAL #3   Title pt will demo functional verbal expression, using compensations, in 10 minutes mod complex conversation   Time 1   Period Weeks    Status On-going   SLP LONG TERM GOAL #4   Title pt will demo compensations for dysphagia in order to decr risk of aspiration 90% of the time with POs during 3 sessions   Time 1   Period Weeks   Status On-going          Plan - 09/08/15 1534    Clinical Impression Statement  Pt demo'd functional intelligibility with 8 minutes simple conversation. Pt was 90% successful with chin tuck today without SLP cues. Continue skilled ST one more session to maximize cararyover of compensations for dysarthria and swallowing,.   Speech Therapy Frequency 2x / week   Duration One additional visit   Treatment/Interventions Aspiration precaution training;Pharyngeal strengthening exercises;Diet toleration management by SLP;SLP instruction and feedback;Compensatory strategies;Internal/external aids;Patient/family education;Functional tasks;Cueing hierarchy   Potential to Achieve Goals Fair   Potential Considerations Severity of impairments;Cooperation/participation level   Consulted and Agree with Plan of Care Patient      Patient will benefit from skilled therapeutic intervention in order to improve the following deficits and impairments:   Dysarthria and anarthria  Dysphagia  Aphasia    Problem List Patient Active Problem List   Diagnosis Date Noted  . PSP (progressive supranuclear palsy) ([place]) 07/15/2015  . Aortic stenosis 06/09/2015  . Dysphasia 06/09/2015  . Aortic valve disorder 05/24/2014  . OSTEOPENIA 08/11/2009  . PEDAL EDEMA 07/05/2008  . BACK PAIN 12/17/2006  . Essential hypertension 11/21/2006  . Allergic rhinitis 11/21/2006  . Osteoporosis 11/21/2006  . History of colonic polyps 11/21/2006    [facility] ,MS, [place]  09/08/2015, 3:37 PM  [facility] [address] [place], [name], [address] Phone: [phone]   Fax:  [phone]   Name: Tiffany [name] MRN: [id number] Date of Birth: [birth year]

## 2015-09-08 NOTE — Therapy (Signed)
[facility] [address] [place], [name], [address] Phone: [phone]   Fax:  [phone]  Physical Therapy Treatment  Patient Details  Name: Tiffany [name] MRN: [id number] Date of Birth: 1932/07/24 Referring Provider: Alonza Bogus, DO  Encounter Date: 09/08/2015      PT End of Session - 09/08/15 1424    Visit Number 13   Number of Visits 17   Date for PT Re-Evaluation 09/17/15   Authorization Type [employer]   Authorization Time Period G Codes and progress note required every 10 visits   PT Start Time 1319   PT Stop Time 1400   PT Time Calculation (min) 41 min   Equipment Utilized During Treatment Gait belt   Activity Tolerance Patient tolerated treatment well   Behavior During Therapy [facility] for tasks assessed/performed      Past Medical History  Diagnosis Date  . ALLERGIC RHINITIS 11/21/2006  . BACK PAIN 12/17/2006  . COLONIC POLYPS, HX OF 11/21/2006  . HYPERTENSION 11/21/2006  . OSTEOPENIA 08/11/2009  . OSTEOPOROSIS 11/21/2006  . PEDAL EDEMA 07/05/2008  . SYSTOLIC MURMUR 7/89/3810    Past Surgical History  Procedure Laterality Date  . Hemorrhoid surgery    . Tonsillectomy    . Breast surgery      bx  . Cataract extraction Bilateral 2012    There were no vitals filed for this visit.      Subjective Assessment - 09/08/15 1320    Subjective Pt reports no changes or falls since last visit.  Pt able to verbalize how to less dizziness with head turns.   Pertinent History [name].  PMH significant for: progressive supranuclear palsy (diagnosed 07/15/15), HTN, osteopenia, aortic stenosis, bilateral cataract extraction   Patient Stated Goals "My goal is to walk better."   Currently in Pain? No/denies                         [facility] Adult PT Treatment/Exercise - 09/08/15 0001    Transfers   Transfers Sit to Stand;Stand to Sit;Floor to Transfer   Sit to Stand 5:  Supervision   Sit to Stand Details (indicate cue type and reason) Verbal cues for hand placement, proper and safe transfer technique   Stand to Sit 5: Supervision   Floor to Transfer 5: Supervision;With upper extremity assist   Floor to Transfer Details (indicate cue type and reason) Floor>stand transfers with UE support.  Discussed need for use of cellphone vs. lifeline for safety in case of falls.   Number of Reps Other reps (comment)   Transfer Cueing 15 reps   Comments Pt has tendency to push through back of lower extremities onto mat   Ambulation/Gait   Ambulation/Gait Yes   Ambulation/Gait Assistance 6: Modified independent (Device/Increase time);5: Supervision   Ambulation/Gait Assistance Details Modified independent 400 ft, then 230 ft using rollator indoor surfaces with occasional cues to stay within BOS of walker.  Gait with rollator outdoors 150 ft with supervision, cues for use of brakes on inclines/declines.   Ambulation Distance (Feet) 400 Feet  then 230, then 150   Assistive device Rollator   Gait Pattern Step-through pattern;Decreased stride length   Ambulation Surface Level;Indoor;Unlevel;Outdoor;Paved   Ramp 5: Supervision   Ramp Details (indicate cue type and reason) Cueing for use of rollator brakes to slow descent.  Ramp practice indoors 2 reps, outdoors 4 reps.   Curb 5: Supervision   Curb Details (indicate  cue type and reason) Cueing for safe technique using rollator.           [facility]) - 09/08/15 1322    PWR! Up x 20   PWR! Rock x 20    PWR! Twist x 20    PWR Step x 20     Pt requires cues for deliberate, starts/stops of movement patterns with PWR! Moves.  Performed 10 reps standing at walker with mat behind her, then 10 reps at counter.  Pt appears safer with standing PWR! Moves with walker in front and mat behind her.    Pt performs forward step and weightshift x 10 reps each side with bilateral UE support, then back step and weightshift x 10 reps each  side with bilateral UE support, with initial max verbal and manual cues for appropriate weightshifting.  Pt needs seated rest break between stepping exercises.          PT Short Term Goals - 08/30/15 1701    PT SHORT TERM GOAL #1   Title Pt will perform initial HEP independently to maximize functional gains made in PT.   (Target date: 08/16/15)   Baseline 08/16/15: with HEP issued to date   Status Achieved   PT SHORT TERM GOAL #2   Title Determine appropriate assistive device and request MD order, if necessary, to decrease fall risk.   Baseline Requested MD order for rollator on 5/19.   Status Achieved   PT SHORT TERM GOAL #3   Title Pt will negotiate 1 step without rails with step-to pattern and mod I using LRAD to indicate pt safety using primary home entrance.   Baseline met on 08/16/15   Status Achieved   PT SHORT TERM GOAL #4   Title Pt will ambulate >300' over level, indoor surfaces with mod I using LRAD to indicate increased safety with household ambulation.    Baseline met on 08/16/15   Status Achieved   PT SHORT TERM GOAL #5   Title Pt will negotiate standard ramp and curb step with supervision using LRAD to indicate increased safety traversing commnuity obstacles.    Baseline Met 5/19.   Status Achieved   PT SHORT TERM GOAL #6   Title Pt will improve FGA score from 13/30 to >/= 16/30 to indicate decreased fall risk.   Baseline Met 5/19 with FGA  of 16/30   Status Achieved           PT Long Term Goals - 08/30/15 1659    PT LONG TERM GOAL #1   Title Pt will independently perform HEP to indicate safe daily compliance with HEP.   (Target date: 09/13/15)   Status On-going   PT LONG TERM GOAL #2   Title Pt will improve FGA score from 13/30 to 23/30 to indicate significant improvement in dynamic gait stability.    Baseline 6/6: REVISED from 19/30 to 23/30 due to pt progressing more quickly than anticipated.   Status Revised   PT LONG TERM GOAL #3   Title Pt will ambulate  >500'' over unlevel, paved surfaces with mod I using LRAD to indicate increased safety with commnuity ambulation.    Status On-going   PT LONG TERM GOAL #4   Title Pt will negotiate standard ramp and curb step with mod I using LRAD to indicate increased independence traversing commnuity obstacles.    Status On-going   PT LONG TERM GOAL #5   Title Pt will verbalize plan for community exercise following episode of PT  to indicate safe transition to community fitness regimen.   Status On-going               Plan - 09/08/15 1424    Clinical Impression Statement Session focused on review of standing PWR! Moves as well as gait training activities and fall recovery (floor>stand transfer).  Pt appears to have good carryover with deliberate movement patterns with PWR! Moves, needing only moderate cues.  Pt does need frequqent cues for hand placement for improved safety with sit<>stand transfers.   Rehab Potential Good   PT Frequency 2x / week   PT Duration 8 weeks   PT Next Visit Plan Continue to work on balance with emphasis on large amplitude movements, gait with rollator and PWR! exercises; plan to check LTGs next week.   Consulted and Agree with Plan of Care Patient      Patient will benefit from skilled therapeutic intervention in order to improve the following deficits and impairments:  Abnormal gait, Decreased balance, Decreased coordination, Postural dysfunction, Other (comment), Dizziness, Decreased knowledge of use of DME, Decreased safety awareness  Visit Diagnosis: Other abnormalities of gait and mobility  Unsteadiness on feet     Problem List Patient Active Problem List   Diagnosis Date Noted  . PSP (progressive supranuclear palsy) ([place]) 07/15/2015  . Aortic stenosis 06/09/2015  . Dysphasia 06/09/2015  . Aortic valve disorder 05/24/2014  . OSTEOPENIA 08/11/2009  . PEDAL EDEMA 07/05/2008  . BACK PAIN 12/17/2006  . Essential hypertension 11/21/2006  . Allergic  rhinitis 11/21/2006  . Osteoporosis 11/21/2006  . History of colonic polyps 11/21/2006    Frazier Butt. 09/08/2015, 2:27 PM  Frazier Butt., PT  [facility] [address] [place], [name], [address] Phone: [phone]   Fax:  [phone]  Name: Tiffany [name] MRN: [id number] Date of Birth: 08/06/1932

## 2015-09-09 ENCOUNTER — Other Ambulatory Visit: Payer: Self-pay | Admitting: Internal Medicine

## 2015-09-13 ENCOUNTER — Encounter: Payer: Commercial Managed Care - HMO | Admitting: Occupational Therapy

## 2015-09-13 ENCOUNTER — Ambulatory Visit: Payer: Commercial Managed Care - HMO | Admitting: Physical Therapy

## 2015-09-13 ENCOUNTER — Ambulatory Visit: Payer: Commercial Managed Care - HMO | Admitting: Occupational Therapy

## 2015-09-13 DIAGNOSIS — R1310 Dysphagia, unspecified: Secondary | ICD-10-CM | POA: Diagnosis not present

## 2015-09-13 DIAGNOSIS — R29898 Other symptoms and signs involving the musculoskeletal system: Secondary | ICD-10-CM

## 2015-09-13 DIAGNOSIS — R29818 Other symptoms and signs involving the nervous system: Secondary | ICD-10-CM | POA: Diagnosis not present

## 2015-09-13 DIAGNOSIS — R4701 Aphasia: Secondary | ICD-10-CM | POA: Diagnosis not present

## 2015-09-13 DIAGNOSIS — R278 Other lack of coordination: Secondary | ICD-10-CM

## 2015-09-13 DIAGNOSIS — R279 Unspecified lack of coordination: Secondary | ICD-10-CM | POA: Diagnosis not present

## 2015-09-13 DIAGNOSIS — R2689 Other abnormalities of gait and mobility: Secondary | ICD-10-CM | POA: Diagnosis not present

## 2015-09-13 DIAGNOSIS — R471 Dysarthria and anarthria: Secondary | ICD-10-CM | POA: Diagnosis not present

## 2015-09-13 DIAGNOSIS — R2681 Unsteadiness on feet: Secondary | ICD-10-CM

## 2015-09-13 NOTE — Therapy (Signed)
[facility] [address] [place], [name], [address] Phone: [phone]   Fax:  [phone]  Occupational Therapy Treatment  Patient Details  Name: Tiffany [name] MRN: [id number] Date of Birth: [birth year] Referring Provider: Dr. Carles Collet  Encounter Date: 09/13/2015      OT End of Session - 09/13/15 1441    Visit Number 15   Number of Visits 17   Date for OT Re-Evaluation 09/17/15   Authorization Type [employer]   Authorization - Visit Number 15   Authorization - Number of Visits 20   OT Start Time 1400   OT Stop Time 1445   OT Time Calculation (min) 45 min   Activity Tolerance Patient tolerated treatment well   Behavior During Therapy [facility] for tasks assessed/performed      Past Medical History  Diagnosis Date  . ALLERGIC RHINITIS 11/21/2006  . BACK PAIN 12/17/2006  . COLONIC POLYPS, HX OF 11/21/2006  . HYPERTENSION 11/21/2006  . OSTEOPENIA 08/11/2009  . OSTEOPOROSIS 11/21/2006  . PEDAL EDEMA 07/05/2008  . SYSTOLIC MURMUR 07/17/5359    Past Surgical History  Procedure Laterality Date  . Hemorrhoid surgery    . Tonsillectomy    . Breast surgery      bx  . Cataract extraction Bilateral 2012    There were no vitals filed for this visit.      Subjective Assessment - 09/13/15 1858    Subjective  Pt reports that family helps with cleaning and that she doen't cook (sandwiches or warms food inmicrowave)   Pertinent History see Epic   Patient Stated Goals handwriting, increased ease with fasteners   Currently in Pain? No/denies      Neuro re-ed:  PWR! Moves (basic 4) in supine x 10 each with min cues For incr movement amplitude/coordination.  In standing, functional reaching in diagonal pattern incorporating trunk rotation/wt. shift and PWR! reach to manipulate and place large pegs in vertical pegboard with min cueing for use of walker for UE support for balance.  Self Care:    Dressing:   Simulated  ADLs with bag with focus/min cues for large amplitude movements:  donning/doffing pants, pulling bag into hand for clothing adjustment/donning socks  Functional mobility:   Ambulating to practice direction changes with instruction/review of strategy with no LOB with rollator.  Pt verbalized understanding.  Reviewed strategies for freezing/to prevent freezing and safety strategies for balance (wide base of support, use of UE support, use of RW at night when going to bathroom, strategies for picking up items from floor).  Pt verbalized understanding  Continued checking goals (standing functional reaching)--see goals section for scores   Coordination: Placing grooved pegs in pegboard with each hand with min difficulty and min cues for use of PWR! Hands.  Dealing cards with thumb with mod cueing for large amplitude movements (>difficulty with R hand than L hand), improved with repetition.                              OT Education - 09/13/15 1900    Education Details Reviewed freezing strategies/ways to prevent freezing; Strategies for turning, using wide base of support, strategies for picking up items from floor, recommended use of RW at night to go to bathroom (vs. furniture walking) for incr safety/prevent falls   Person(s) Educated Patient   Methods Explanation;Demonstration;Verbal cues   Comprehension Returned demonstration;Verbalized understanding  OT Short Term Goals - 09/08/15 1426    OT SHORT TERM GOAL #1   Title I with HEP. due 08/17/15   Status Achieved   OT SHORT TERM GOAL #2   Title Pt will demonstrate improved fine motor coordination as evidenced by decreasing 9 hole peg test score to 45 secs or less.   Time 4   Period Weeks   Status Achieved  37.69 secs   OT SHORT TERM GOAL #3   Title Pt will verbalize understanding of adapted strategies for ADLs.   Time 4   Period Weeks   Status Achieved   OT SHORT TERM GOAL #4   Title Pt will  verbalize understanding of compensatory strategies for visual changes.   Time 4   Period Weeks   Status Achieved  needs reinforcement   OT SHORT TERM GOAL #5   Title Pt will write a short paragraph with 100 % legibility and minimal decrease in letter size.   Time 4   Period Weeks   Status Achieved           OT Long Term Goals - 09/13/15 1438    OT LONG TERM GOAL #1   Title Pt will demonstrate improved fine motor coordination as evidenced by decreasing RUE 9 hole peg test score to 40 secs or less.- due 09/16/15   Baseline RUE 51.69, LUE 29.53 secs   Time 8   Period Weeks   Status Achieved  37.69 secs   OT LONG TERM GOAL #2   Title Pt will improve RUE standing functional reach to 10 " or greater for improved standing balance.   Baseline RUE 9", LUE 10"   Time 8   Period Weeks   Status Not Met  R 9.5", L 9"   OT LONG TERM GOAL #3   Title Pt will improve RUE box/ blocks score to 41 blocks.   Baseline baseline: RUE 38 blocks, LUE 43 blocks   Time 8   Period Weeks   Status On-going   OT LONG TERM GOAL #4   Title Pt will demonstrate improved ease with fasteners as evidenced  by performing 3 button/ unbutton in 26 secs or less.   Baseline 29.22 secs   Time 8   Period Weeks   Status Not Met  28.88 secs   OT LONG TERM GOAL #5   Title Pt will verbalize understanding of strategies to minimize freezing episodes   Time 8   Period Weeks   Status Achieved  09/13/15               Plan - 09/13/15 1903    Clinical Impression Statement Pt is progressing towards goals and verbalizes understanding of ADL strategies.  However, pt continues to demo decr balance without use of strategies.  Reviewed safety strategies.   Rehab Potential Good   Clinical Impairments Affecting Rehab Potential timing deficits with movement, difficulty sequencing movement   OT Frequency 2x / week   OT Duration 8 weeks   OT Treatment/Interventions Self-care/ADL training;Therapeutic  exercise;Patient/family education;Balance training;Splinting;Manual Therapy;Neuromuscular education;Therapeutic exercises;Therapeutic activities;Fluidtherapy;Cognitive remediation/compensation;Visual/perceptual remediation/compensation;Passive range of motion;Moist Heat;Contrast Bath;DME and/or AE instruction;Parrafin;Cryotherapy;Energy conservation;Ultrasound   Plan review modified quadraped PWR! moves, check remaining goals and d/c (re-eval in 6 months)   OT Home Exercise Plan Education provided:  coordination HEP; PD Handwriting tips (08/04/15) prone PWR! , modified quadraped, tips to prevent freezing episodes/safety strategies    Consulted and Agree with Plan of Care Patient      Patient will  benefit from skilled therapeutic intervention in order to improve the following deficits and impairments:  Decreased coordination, Decreased range of motion, Decreased safety awareness, Decreased endurance, Decreased activity tolerance, Impaired tone, Impaired UE functional use, Pain, Decreased knowledge of use of DME, Decreased balance, Decreased cognition, Decreased mobility, Decreased strength, Impaired vision/preception  Visit Diagnosis: Other symptoms and signs involving the nervous system  Other symptoms and signs involving the musculoskeletal system  Other lack of coordination  Unsteadiness on feet    Problem List Patient Active Problem List   Diagnosis Date Noted  . PSP (progressive supranuclear palsy) ([place]) 07/15/2015  . Aortic stenosis 06/09/2015  . Dysphasia 06/09/2015  . Aortic valve disorder 05/24/2014  . OSTEOPENIA 08/11/2009  . PEDAL EDEMA 07/05/2008  . BACK PAIN 12/17/2006  . Essential hypertension 11/21/2006  . Allergic rhinitis 11/21/2006  . Osteoporosis 11/21/2006  . History of colonic polyps 11/21/2006    [facility] 09/13/2015, 7:07 PM  [facility] [address] [place], [name], [address] Phone:  [phone]   Fax:  [phone]  Name: Tiffany [name] MRN: [id number] Date of Birth: 11-Aug-1932  Vianne Bulls, OTR/L [facility] [address] [place], [place]  [address] [phone] phone [phone] 09/13/2015 7:07 PM

## 2015-09-13 NOTE — Therapy (Signed)
[facility] [address] [place], [name], [address] Phone: [phone]   Fax:  [phone]  Physical Therapy Treatment  Patient Details  Name: Tiffany [name] MRN: [id number] Date of Birth: August 28, 1932 Referring Provider: Alonza Bogus, DO  Encounter Date: 09/13/2015      PT End of Session - 09/13/15 1437    Visit Number 14   Number of Visits 17   Date for PT Re-Evaluation 09/17/15   Authorization Type [employer]   Authorization Time Period G Codes and progress note required every 10 visits   PT Start Time 1319   PT Stop Time 1400   PT Time Calculation (min) 41 min   Equipment Utilized During Treatment Gait belt   Activity Tolerance Patient tolerated treatment well   Behavior During Therapy [facility] for tasks assessed/performed      Past Medical History  Diagnosis Date  . ALLERGIC RHINITIS 11/21/2006  . BACK PAIN 12/17/2006  . COLONIC POLYPS, HX OF 11/21/2006  . HYPERTENSION 11/21/2006  . OSTEOPENIA 08/11/2009  . OSTEOPOROSIS 11/21/2006  . PEDAL EDEMA 07/05/2008  . SYSTOLIC MURMUR 8/67/6195    Past Surgical History  Procedure Laterality Date  . Hemorrhoid surgery    . Tonsillectomy    . Breast surgery      bx  . Cataract extraction Bilateral 2012    There were no vitals filed for this visit.      Subjective Assessment - 09/13/15 1321    Subjective Pt reports no changes or no falls since last visit.  Pt wants to finish therapy later this week.   Pertinent History [name].  PMH significant for: progressive supranuclear palsy (diagnosed 07/15/15), HTN, osteopenia, aortic stenosis, bilateral cataract extraction   Patient Stated Goals "My goal is to walk better."   Currently in Pain? No/denies                         [facility] Adult PT Treatment/Exercise - 09/13/15 1327    Transfers   Transfers Sit to Stand;Stand to Sit   Sit to Stand 5: Supervision   Sit to Stand Details  (indicate cue type and reason) Verbal cues for hand placement   Stand to Sit 5: Supervision   Ambulation/Gait   Ambulation/Gait Yes   Ambulation/Gait Assistance 6: Modified independent (Device/Increase time);5: Supervision   Ambulation/Gait Assistance Details Supervision for gait with RW (as pt reports she uses rolling walker in home and rollator outdoors and in community. Gait using RW negotiating turns around furniture and tight spaces with cues for slowed pace, to stop to pick up and turn walker as needed.   Ambulation Distance (Feet) 400 Feet  rollator, then 500 ft with RW   Assistive device Rollator;Rolling walker   Gait Pattern Step-through pattern;Decreased stride length   Ambulation Surface Level;Indoor;Unlevel;Outdoor;Paved  Inclines, declines and curbs outdoors   Ramp 5: Supervision   Ramp Details (indicate cue type and reason) Pt uses brakes to slow descent down ramp   Curb 5: Supervision   Curb Details (indicate cue type and reason) Pt demonstrates safety with locking then lifting rollator to and from curb.   Gait Comments Outdoor gait x 500 ft using rollator walker with supervision; cues provided for foot clearance and for environmental scanning ahead to prepare for inclines/declines.   Self-Care   Self-Care Other Self-Care Comments   Other Self-Care Comments  Discussed options for continued community fitness upon D/C from PT.  Provided  pt with info on optimal fitness post D/C from people with Parkinsonism.  Discussed importance of patient having supervision with gait or machine activities in the community for safety.     Gait indoors with rollator 80 ft x 2, supervision,with cues for increased step length.           PT Education - 09/13/15 1436    Education provided Yes   Education Details Optimal fitness plan post therapy d/C   Person(s) Educated Patient   Methods Explanation;Handout   Comprehension Verbalized understanding          PT Short Term Goals -  08/30/15 1701    PT SHORT TERM GOAL #1   Title Pt will perform initial HEP independently to maximize functional gains made in PT.   (Target date: 08/16/15)   Baseline 08/16/15: with HEP issued to date   Status Achieved   PT SHORT TERM GOAL #2   Title Determine appropriate assistive device and request MD order, if necessary, to decrease fall risk.   Baseline Requested MD order for rollator on 5/19.   Status Achieved   PT SHORT TERM GOAL #3   Title Pt will negotiate 1 step without rails with step-to pattern and mod I using LRAD to indicate pt safety using primary home entrance.   Baseline met on 08/16/15   Status Achieved   PT SHORT TERM GOAL #4   Title Pt will ambulate >300' over level, indoor surfaces with mod I using LRAD to indicate increased safety with household ambulation.    Baseline met on 08/16/15   Status Achieved   PT SHORT TERM GOAL #5   Title Pt will negotiate standard ramp and curb step with supervision using LRAD to indicate increased safety traversing commnuity obstacles.    Baseline Met 5/19.   Status Achieved   PT SHORT TERM GOAL #6   Title Pt will improve FGA score from 13/30 to >/= 16/30 to indicate decreased fall risk.   Baseline Met 5/19 with FGA  of 16/30   Status Achieved           PT Long Term Goals - 09/13/15 1437    PT LONG TERM GOAL #1   Title Pt will independently perform HEP to indicate safe daily compliance with HEP.   (Target date: 09/13/15)   Status On-going   PT LONG TERM GOAL #2   Title Pt will improve FGA score from 13/30 to 23/30 to indicate significant improvement in dynamic gait stability.    Baseline 6/6: REVISED from 19/30 to 23/30 due to pt progressing more quickly than anticipated.   Status Revised   PT LONG TERM GOAL #3   Title Pt will ambulate >500'' over unlevel, paved surfaces with mod I using LRAD to indicate increased safety with commnuity ambulation.    PT LONG TERM GOAL #4   Title Pt will negotiate standard ramp and curb step with  mod I using LRAD to indicate increased independence traversing commnuity obstacles.    Status On-going   PT LONG TERM GOAL #5   Title Pt will verbalize plan for community exercise following episode of PT to indicate safe transition to community fitness regimen.   Status On-going               Plan - 09/13/15 1438    Clinical Impression Statement Gait activities performed today with rollaor and with RW.  With rollator with curb and ramp and outdoor activities, pt demonstrates slow, controlled maneuvering of rollator, with  only occasional cues for foot clearance and planning for inclines/declines.  Pt states she is ready for discharge next visit.   Rehab Potential Good   PT Frequency 2x / week   PT Duration 8 weeks   PT Next Visit Plan Check remaining LTGs and plan for discharge next visit.  Discuss PD screen versus return eval.   Consulted and Agree with Plan of Care Patient      Patient will benefit from skilled therapeutic intervention in order to improve the following deficits and impairments:  Abnormal gait, Decreased balance, Decreased coordination, Postural dysfunction, Other (comment), Dizziness, Decreased knowledge of use of DME, Decreased safety awareness  Visit Diagnosis: Other abnormalities of gait and mobility  Other symptoms and signs involving the nervous system     Problem List Patient Active Problem List   Diagnosis Date Noted  . PSP (progressive supranuclear palsy) ([place]) 07/15/2015  . Aortic stenosis 06/09/2015  . Dysphasia 06/09/2015  . Aortic valve disorder 05/24/2014  . OSTEOPENIA 08/11/2009  . PEDAL EDEMA 07/05/2008  . BACK PAIN 12/17/2006  . Essential hypertension 11/21/2006  . Allergic rhinitis 11/21/2006  . Osteoporosis 11/21/2006  . History of colonic polyps 11/21/2006    Frazier Butt. 09/13/2015, 2:42 PM  Frazier Butt., PT  [facility] [address] [place], [name],  [address] Phone: [phone]   Fax:  [phone]  Name: Tiffany [name] MRN: [id number] Date of Birth: Jul 24, 1932

## 2015-09-13 NOTE — Patient Instructions (Signed)
Optimal Fitness Program after Therapy for People with Parkinsonism  1)  Therapy Home Exercise Program  -Do these Exercises DAILY as instructed by your therapist  -Big, deliberate effort with exercises  -These exercises are important to perform consistently, even when therapist has  finished, because these therapy exercises often address your specific  Parkinson's difficulties   2)  Walking (You want to make sure to have someone with you for safety)  -  Work up to walking 3-5 times per week, 15-20 minutes per day  -This can be done at home, level driveway, indoor track or fellowship hall  -Focus should be on your Best posture, staying close to the walker for your best walking pattern  3)  Aerobic Exercise  (You want to make sure to have someone with you for safety)  -Work up to 3-5 times per week, 20-30 minutes per day  -This can be stationary bike, seated stepper machine

## 2015-09-15 ENCOUNTER — Ambulatory Visit: Payer: Commercial Managed Care - HMO | Admitting: Physical Therapy

## 2015-09-15 ENCOUNTER — Ambulatory Visit: Payer: Commercial Managed Care - HMO | Admitting: Occupational Therapy

## 2015-09-15 DIAGNOSIS — R29898 Other symptoms and signs involving the musculoskeletal system: Secondary | ICD-10-CM

## 2015-09-15 DIAGNOSIS — R4701 Aphasia: Secondary | ICD-10-CM | POA: Diagnosis not present

## 2015-09-15 DIAGNOSIS — R2689 Other abnormalities of gait and mobility: Secondary | ICD-10-CM | POA: Diagnosis not present

## 2015-09-15 DIAGNOSIS — R29818 Other symptoms and signs involving the nervous system: Secondary | ICD-10-CM

## 2015-09-15 DIAGNOSIS — R471 Dysarthria and anarthria: Secondary | ICD-10-CM | POA: Diagnosis not present

## 2015-09-15 DIAGNOSIS — R2681 Unsteadiness on feet: Secondary | ICD-10-CM | POA: Diagnosis not present

## 2015-09-15 DIAGNOSIS — R278 Other lack of coordination: Secondary | ICD-10-CM | POA: Diagnosis not present

## 2015-09-15 DIAGNOSIS — R279 Unspecified lack of coordination: Secondary | ICD-10-CM | POA: Diagnosis not present

## 2015-09-15 DIAGNOSIS — R1310 Dysphagia, unspecified: Secondary | ICD-10-CM | POA: Diagnosis not present

## 2015-09-15 NOTE — Patient Instructions (Signed)
   Lie on back holding wand. Raise arms over head. Hold 5sec. Repeat 10 times per set.  Do 2-3 sessions per day.   ROM: Abduction - Wand   Holding wand with left hand palm up, push wand directly out to side, leading with other hand palm down, until stretch is felt. Hold 5 seconds. Repeat 10 times per set. Do 2-3 sessions per day. (Lying down)

## 2015-09-15 NOTE — Therapy (Signed)
[place] [address] [place], [name], [address] Phone: [phone]   Fax:  [phone]  Occupational Therapy Treatment  Patient Details  Name: Tiffany [name] MRN: [id number] Date of Birth: 01-08-1933 Referring Provider: Dr. Carles Collet  Encounter Date: 09/15/2015      OT End of Session - 09/15/15 1357    Visit Number 16   Number of Visits 17   Date for OT Re-Evaluation 09/17/15   Authorization Type [employer]   Authorization - Visit Number 16   Authorization - Number of Visits 20   OT Start Time 6378   OT Stop Time 1450   OT Time Calculation (min) 45 min   Activity Tolerance Patient tolerated treatment well   Behavior During Therapy [facility] for tasks assessed/performed      Past Medical History  Diagnosis Date  . ALLERGIC RHINITIS 11/21/2006  . BACK PAIN 12/17/2006  . COLONIC POLYPS, HX OF 11/21/2006  . HYPERTENSION 11/21/2006  . OSTEOPENIA 08/11/2009  . OSTEOPOROSIS 11/21/2006  . PEDAL EDEMA 07/05/2008  . SYSTOLIC MURMUR 5/88/5027    Past Surgical History  Procedure Laterality Date  . Hemorrhoid surgery    . Tonsillectomy    . Breast surgery      bx  . Cataract extraction Bilateral 2012    There were no vitals filed for this visit.      Subjective Assessment - 09/15/15 1718    Subjective  Pt had a fall last night going to bathroom   Pertinent History see Epic   Patient Stated Goals handwriting, increased ease with fasteners   Currently in Pain? Yes   Pain Score 5    Pain Location Shoulder   Pain Orientation Left   Pain Descriptors / Indicators Aching   Pain Type Acute pain   Pain Onset Yesterday   Pain Frequency Intermittent   Aggravating Factors  raising LUE actively   Pain Relieving Factors rest       Self Care: Pt/family education regarding safety with ADLs reviewed (sitting for LB dressing, bathing, grooming, use of RW at night when going to restroom, recommendation for lifeline);  reviewed ways to prevent future complications; constipation/decr appetite/wt. Loss as non-motor symptoms of PSP and importance of eating small meals regularly (with incr fiber, fruits/vegetables) and incr water intake (or try smoothies with difficulty swallowing).  Recommended incr supervision at home for safety due to fall risk and recommended family monitor pt at night to determine if pt is safe going to bathroom with RW or if pt would be safer with BSC.   Also recommended that pt discuss medication and difficulty with balance at night with Dr. Carles Collet.  Pt/family verbalized understanding.  Checked remaining goals and discussed progress--see goal section.  Pt instructed in AAROM cane ex for L shoulder currently in supine, progress to sitting once pain improves, and then progress to resuming original therapy HEP once pain resolves.  Instructed pt/family to have physician evaluate L shoulder if pt has incr pain, incr difficulty with movement (L shoulder flex 115*, abduction 140* with 5/10 pain, IR/ER WNL and no pain with full PROM), if pt continues to demo guarding pattern or if movement and pain have not improved in a few days.  Also discussed re-eval and indications to return to therapy sooner if needed.  Pt/family verbalized understanding.                       OT Education - 09/15/15 1720  Education Details Cane ex in supine for shoulder flex and abduction   Person(s) Educated Patient;Caregiver(s)  daughter in Sports coach   Methods Explanation;Verbal cues;Demonstration;Handout   Comprehension Verbalized understanding;Returned demonstration;Verbal cues required          OT Short Term Goals - 09/08/15 1426    OT SHORT TERM GOAL #1   Title I with HEP. due 08/17/15   Status Achieved   OT SHORT TERM GOAL #2   Title Pt will demonstrate improved fine motor coordination as evidenced by decreasing 9 hole peg test score to 45 secs or less.   Time 4   Period Weeks   Status Achieved  37.69  secs   OT SHORT TERM GOAL #3   Title Pt will verbalize understanding of adapted strategies for ADLs.   Time 4   Period Weeks   Status Achieved   OT SHORT TERM GOAL #4   Title Pt will verbalize understanding of compensatory strategies for visual changes.   Time 4   Period Weeks   Status Achieved  needs reinforcement   OT SHORT TERM GOAL #5   Title Pt will write a short paragraph with 100 % legibility and minimal decrease in letter size.   Time 4   Period Weeks   Status Achieved           OT Long Term Goals - 09/15/15 1441    OT LONG TERM GOAL #1   Title Pt will demonstrate improved fine motor coordination as evidenced by decreasing RUE 9 hole peg test score to 40 secs or less.- due 09/16/15   Baseline RUE 51.69, LUE 29.53 secs   Time 8   Period Weeks   Status Achieved  37.69 secs   OT LONG TERM GOAL #2   Title Pt will improve RUE standing functional reach to 10 " or greater for improved standing balance.   Baseline RUE 9", LUE 10"   Time 8   Period Weeks   Status Not Met  R 9.5", L 9"   OT LONG TERM GOAL #3   Title Pt will improve RUE box/ blocks score to 41 blocks.   Baseline baseline: RUE 38 blocks, LUE 43 blocks   Time 8   Period Weeks   Status Not Met  09/20/15:  R-40blocks, L-44blocks   OT LONG TERM GOAL #4   Title Pt will demonstrate improved ease with fasteners as evidenced  by performing 3 button/ unbutton in 26 secs or less.   Baseline 29.22 secs   Time 8   Period Weeks   Status Not Met  28.88 secs   OT LONG TERM GOAL #5   Title Pt will verbalize understanding of strategies to minimize freezing episodes   Time 8   Period Weeks   Status Achieved  09/13/15               Plan - 09/15/15 1342    Clinical Impression Statement Pt had a fall last night with R shoulder pain that limits pt's ability to perform HEP, but demo full PROM without pain (AROM limited by pain).  Pt/family instructed to call MD if pain/ability to move LUE worsens or if  pain/movement does not improve in the next few days.  Pt/family verbalized understanding.   Rehab Potential Good   Clinical Impairments Affecting Rehab Potential timing deficits with movement, difficulty sequencing movement   OT Frequency 2x / week   OT Duration 8 weeks   OT Treatment/Interventions Self-care/ADL training;Therapeutic exercise;Patient/family education;Balance training;Splinting;Manual Therapy;Neuromuscular  education;Therapeutic exercises;Therapeutic activities;Fluidtherapy;Cognitive remediation/compensation;Visual/perceptual remediation/compensation;Passive range of motion;Moist Heat;Contrast Bath;DME and/or AE instruction;Parrafin;Cryotherapy;Energy conservation;Ultrasound   Plan d/c OT; re-eval in approx 6 months, but sooner if needed prn.  Pt/family verbalized understanding/agreement   OT Home Exercise Plan Education provided:  coordination HEP; PD Handwriting tips (08/04/15) prone PWR! , modified quadraped, tips to prevent freezing episodes/safety strategies    Consulted and Agree with Plan of Care Patient;Family member/caregiver   Family Member Consulted daugher-in-law      Patient will benefit from skilled therapeutic intervention in order to improve the following deficits and impairments:  Decreased coordination, Decreased range of motion, Decreased safety awareness, Decreased endurance, Decreased activity tolerance, Impaired tone, Impaired UE functional use, Pain, Decreased knowledge of use of DME, Decreased balance, Decreased cognition, Decreased mobility, Decreased strength, Impaired vision/preception  Visit Diagnosis: Other symptoms and signs involving the nervous system  Other symptoms and signs involving the musculoskeletal system  Other lack of coordination  Unsteadiness on feet      G-Codes - 10-12-15 1351    Functional Assessment Tool Used 9 hole peg test: RUE 37.69secs, Pt is able to write a short parapgraph with min decrease in letter size and good  legibility.   Functional Limitation Self care   Self Care Goal Status [phone]) At least 1 percent but less than 20 percent impaired, limited or restricted   Self Care Discharge Status [phone]) At least 1 percent but less than 20 percent impaired, limited or restricted     [place]  Visits from Start of Care: 16  Current functional level related to goals / functional outcomes: See above   Remaining deficits: decr balance for ADLs, decr coordination/timing of movement, hypokinesia, abnormal posture   Education / Equipment: Pt instructed in the following:  HEP, strategies for ADLs, ways to prevent future complication.  Pt verbalized understanding of all education provided.    Plan: Patient agrees to discharge.  Patient goals were not met. Patient is being discharged due to being pleased with the current functional level.  Pt would benefit from occupational therapy re-eval in approx 6 months to assess for need for further therapy/functional changes due to progressive nature of  diagnosis (sooner if needed). ?????       Problem List Patient Active Problem List   Diagnosis Date Noted  . PSP (progressive supranuclear palsy) ([place]) 07/15/2015  . Aortic stenosis 06/09/2015  . Dysphasia 06/09/2015  . Aortic valve disorder 05/24/2014  . OSTEOPENIA 08/11/2009  . PEDAL EDEMA 07/05/2008  . BACK PAIN 12/17/2006  . Essential hypertension 11/21/2006  . Allergic rhinitis 11/21/2006  . Osteoporosis 11/21/2006  . History of colonic polyps 11/21/2006    [facility] 10-12-2015, 5:27 PM  [place] [address] [place], [name], [address] Phone: [phone]   Fax:  [phone]  Name: Tiffany [name] MRN: [id number] Date of Birth: May 31, 1932  Vianne Bulls, OTR/L [facility] [address] [place], [place]  [address] [phone] phone [phone] 10/12/2015  5:27 PM

## 2015-09-15 NOTE — Therapy (Signed)
[place] [address] [place], [name], [address] Phone: [phone]   Fax:  [phone]  Physical Therapy Treatment  Patient Details  Name: Tiffany [name] MRN: [id number] Date of Birth: [birth year] Referring Provider: Alonza Bogus, DO  Encounter Date: 09/15/2015      PT End of Session - 09/15/15 1448    Visit Number 15   Number of Visits 17   Date for PT Re-Evaluation 09/17/15   Authorization Type [employer]   Authorization Time Period G Codes and progress note required every 10 visits   PT Start Time 1317   PT Stop Time 1400   PT Time Calculation (min) 43 min   Equipment Utilized During Treatment Gait belt   Activity Tolerance Patient tolerated treatment well   Behavior During Therapy [facility] for tasks assessed/performed      Past Medical History  Diagnosis Date  . ALLERGIC RHINITIS 11/21/2006  . BACK PAIN 12/17/2006  . COLONIC POLYPS, HX OF 11/21/2006  . HYPERTENSION 11/21/2006  . OSTEOPENIA 08/11/2009  . OSTEOPOROSIS 11/21/2006  . PEDAL EDEMA 07/05/2008  . SYSTOLIC MURMUR 1/94/1740    Past Surgical History  Procedure Laterality Date  . Hemorrhoid surgery    . Tonsillectomy    . Breast surgery      bx  . Cataract extraction Bilateral 2012    There were no vitals filed for this visit.      Subjective Assessment - 09/15/15 1319    Subjective Not a great day.  I fell last night and hurt my shoulder, when I was trying to crawl back in bed.  Having trouble lifting my L arm.   Pertinent History [name].  PMH significant for: progressive supranuclear palsy (diagnosed 07/15/15), HTN, osteopenia, aortic stenosis, bilateral cataract extraction   Patient Stated Goals "My goal is to walk better."   Currently in Pain? No/denies  No pain in L shoulder, just can't lift it.                         [place] Adult PT Treatment/Exercise - 09/15/15 0001    Transfers   Transfers Sit to  Stand;Stand to Sit   Sit to Stand 5: Supervision   Sit to Stand Details (indicate cue type and reason) Verbal cues for hand placement   Stand to Sit 5: Supervision   Comments Practiced bed mobility based on pt's report of falling trying to crawl into bed last night after using bathroom (without walker).  Discussed need for use of walker at night; Simulated getting into and out of bed, with instruction to pt to turn and sit at edge of bed, then lean into sidelying>supine position (vs crawling into bed).  Pt return demo this technique 2 reps.   Ambulation/Gait   Ambulation/Gait Yes   Ambulation/Gait Assistance 6: Modified independent (Device/Increase time);5: Supervision   Ambulation/Gait Assistance Details Pt has 2 episodes during gait today with R foot catching with gait.  Pt able to regain balance on her own; however, pt overall seems more unsteady and walking at faster pace today.   Ambulation Distance (Feet) 400 Feet  then 500 ft, then >300 ft negotiating obstacles in gym   Assistive device Rollator   Gait Pattern Step-through pattern;Decreased stride length;Poor foot clearance - right  Increased gait pace today   Ambulation Surface Level;Indoor;Outdoor;Paved   Gait Comments Outdoor gait with supervision and cues for slowing for declines.  Indoor gait with furniture  negotiation and turns, with supervision.  Pt needs cues to slow pace of gait at times.   Self-Care   Self-Care Other Self-Care Comments   Other Self-Care Comments  Reviewed information provided last visit on community fitness; pt reports not yet following up on this.  Discussed fall prevention and events surrounding fall last night getting into bed.  Discussed with daughter-in-law present fall prevention, safety in the home, and need for supervision in the home for safety.  Discussed patient's L shoulder pain/decreased motion since fall last night-recommended contacting physician if mobility concerns or pain persists.                 PT Education - 09/15/15 1447    Education Details Fall prevention education, need for supervision with gait in the home for safety; use of RW at all times; discussed D/C and return PT eval in 6 months   Person(s) Educated Patient;Other (comment)  daughter-in-law, Stanton Kidney   Methods Explanation   Comprehension Verbalized understanding          PT Short Term Goals - 08/30/15 1701    PT SHORT TERM GOAL #1   Title Pt will perform initial HEP independently to maximize functional gains made in PT.   (Target date: 08/16/15)   Baseline 08/16/15: with HEP issued to date   Status Achieved   PT SHORT TERM GOAL #2   Title Determine appropriate assistive device and request MD order, if necessary, to decrease fall risk.   Baseline Requested MD order for rollator on 5/19.   Status Achieved   PT SHORT TERM GOAL #3   Title Pt will negotiate 1 step without rails with step-to pattern and mod I using LRAD to indicate pt safety using primary home entrance.   Baseline met on 08/16/15   Status Achieved   PT SHORT TERM GOAL #4   Title Pt will ambulate >300' over level, indoor surfaces with mod I using LRAD to indicate increased safety with household ambulation.    Baseline met on 08/16/15   Status Achieved   PT SHORT TERM GOAL #5   Title Pt will negotiate standard ramp and curb step with supervision using LRAD to indicate increased safety traversing commnuity obstacles.    Baseline Met 5/19.   Status Achieved   PT SHORT TERM GOAL #6   Title Pt will improve FGA score from 13/30 to >/= 16/30 to indicate decreased fall risk.   Baseline Met 5/19 with FGA  of 16/30   Status Achieved           PT Long Term Goals - 09/15/15 1325    PT LONG TERM GOAL #1   Title Pt will independently perform HEP to indicate safe daily compliance with HEP.   (Target date: 09/13/15)   Status Partially Met   PT LONG TERM GOAL #2   Title Pt will improve FGA score from 13/30 to 23/30 to indicate significant  improvement in dynamic gait stability.    Baseline 6/6: REVISED from 19/30 to 23/30 due to pt progressing more quickly than anticipated.   Status Deferred   PT LONG TERM GOAL #3   Title Pt will ambulate >500'' over unlevel, paved surfaces with mod I using LRAD to indicate increased safety with commnuity ambulation.    Status Partially Met   PT LONG TERM GOAL #4   Title Pt will negotiate standard ramp and curb step with mod I using LRAD to indicate increased independence traversing commnuity obstacles.    Status Partially  Met   PT LONG TERM GOAL #5   Title Pt will verbalize plan for community exercise following episode of PT to indicate safe transition to community fitness regimen.   Status Partially Met               Plan - 09/20/2015 1449    Clinical Impression Statement Pt has partially met LTG #1, 3, 4, 5.  Pt able to negotiate curbs and ramps as well as outdoor gait, but supervision is ideal for safety.  LTG #2 not met-deferred due to FGA not being assessed, as pt's safest assistive device is rollator at this time.  Discussed with family member the need for supervision in the home for improved safety with gait.  Pt is appropriate for discharge at this time.   Rehab Potential Good   PT Frequency 2x / week   PT Duration 8 weeks   PT Next Visit Plan Discharge this visit.   Consulted and Agree with Plan of Care Patient;Family member/caregiver   Family Member Consulted daughter in law, Stanton Kidney      Patient will benefit from skilled therapeutic intervention in order to improve the following deficits and impairments:  Abnormal gait, Decreased balance, Decreased coordination, Postural dysfunction, Other (comment), Dizziness, Decreased knowledge of use of DME, Decreased safety awareness  Visit Diagnosis: Other abnormalities of gait and mobility  Unsteadiness on feet  Other symptoms and signs involving the nervous system       G-Codes - 20-Sep-2015 1451    Functional Assessment Tool  Used Gait with rollator supervision, up to 500 ft; at least 2 falls in the past 2 weeks   Functional Limitation Mobility: Walking and moving around   Mobility: Walking and Moving Around Goal Status [phone]) At least 20 percent but less than 40 percent impaired, limited or restricted   Mobility: Walking and Moving Around Discharge Status [phone]) At least 20 percent but less than 40 percent impaired, limited or restricted      Problem List Patient Active Problem List   Diagnosis Date Noted  . PSP (progressive supranuclear palsy) ([place]) 07/15/2015  . Aortic stenosis 06/09/2015  . Dysphasia 06/09/2015  . Aortic valve disorder 05/24/2014  . OSTEOPENIA 08/11/2009  . PEDAL EDEMA 07/05/2008  . BACK PAIN 12/17/2006  . Essential hypertension 11/21/2006  . Allergic rhinitis 11/21/2006  . Osteoporosis 11/21/2006  . History of colonic polyps 11/21/2006    Haru Anspaugh W. 09/20/2015, 2:53 PM  Frazier Butt., PT  [facility] [address] [place], [name], [address] Phone: [phone]   Fax:  [phone]  Name: Tiffany [name] MRN: [id number] Date of Birth: 08-03-1932  PHYSICAL THERAPY DISCHARGE SUMMARY  Visits from Start of Care: 15  Current functional level related to goals / functional outcomes:     PT Long Term Goals - 09/20/2015 1325    PT LONG TERM GOAL #1   Title Pt will independently perform HEP to indicate safe daily compliance with HEP.   (Target date: 09/13/15)   Status Partially Met   PT LONG TERM GOAL #2   Title Pt will improve FGA score from 13/30 to 23/30 to indicate significant improvement in dynamic gait stability.    Baseline 6/6: REVISED from 19/30 to 23/30 due to pt progressing more quickly than anticipated.   Status Deferred   PT LONG TERM GOAL #3   Title Pt will ambulate >500'' over unlevel, paved surfaces with mod I using LRAD to indicate increased safety with commnuity ambulation.  Status Partially  Met   PT LONG TERM GOAL #4   Title Pt will negotiate standard ramp and curb step with mod I using LRAD to indicate increased independence traversing commnuity obstacles.    Status Partially Met   PT LONG TERM GOAL #5   Title Pt will verbalize plan for community exercise following episode of PT to indicate safe transition to community fitness regimen.   Status Partially Met       Remaining deficits: Decreased safety awareness, fall risk, decreased balance, decreased timing and coordination with gait   Education / Equipment: Educated in [employer], fall prevention, community exercise  Plan: Patient agrees to discharge.  Patient goals were partially met. Patient is being discharged due to being pleased with the current functional level.  ?????Plan return PT eval  in 6 months due to progressive nature of disease process.    Mady Haagensen, PT 09/15/2015 2:56 PM Phone: [phone] Fax: [phone]

## 2015-09-15 NOTE — Patient Instructions (Signed)

## 2015-09-20 ENCOUNTER — Ambulatory Visit (INDEPENDENT_AMBULATORY_CARE_PROVIDER_SITE_OTHER): Payer: Commercial Managed Care - HMO | Admitting: Neurology

## 2015-09-20 ENCOUNTER — Encounter: Payer: Self-pay | Admitting: Neurology

## 2015-09-20 VITALS — BP 104/70 | HR 97 | Ht 64.0 in | Wt 151.6 lb

## 2015-09-20 DIAGNOSIS — R1310 Dysphagia, unspecified: Secondary | ICD-10-CM

## 2015-09-20 DIAGNOSIS — G231 Progressive supranuclear ophthalmoplegia [Steele-Richardson-Olszewski]: Secondary | ICD-10-CM | POA: Diagnosis not present

## 2015-09-20 NOTE — Progress Notes (Signed)
Tiffany [name] was seen today in the movement disorders clinic for neurologic consultation at the request of Nyoka Cowden, MD.  The patient is seen today in neurologic consultation for the subacute evaluation of gait and speech changes for the last several months.  Looking back, she thinks that her handwriting began to change after Thanksgiving some time.  She remembers it being difficult to write her cards at thanksgiving, as the handwriting was smaller and less fluid.  She thinks that she began to have difficulty with word finding about 2 months ago.  She presented to the emergency room at [facility] in [place] on February 27 and I reviewed those records.  She had an MRI of the brain there and I do not have the films, but I do have the report.  It was nonacute and only revealed mild white matter changes.  She was told to follow up with ENT.  She did not have an ENT consult but she did follow up with her primary care physician on 06/03/2015 and was set up with a speech therapy evaluation, cardiology follow-up and was referred here.  She had a carotid ultrasound on 06/06/2015 which just demonstrated 1-39% stenosis.  I reviewed her cardiology follow-up from 06/09/2015 and they did not think that her new symptoms were related to her aortic stenosis.  Her repeat echocardiogram is pending.  I did get a note from her speech therapist and reviewed their records as well.  It was felt that she had both dysphasia as well as dysphagia and a modified barium swallow as well as traditional speech, occupational and physical therapy are being recommended.  Denies muscles jumping.  Is having trouble swallowing.  Only new medication is Lipitor and she started that a few weeks ago.    07/15/15 update:  The patient follows up today after having multiple tests performed.  She had an MRI of the cervical spine on April, 2017 that demonstrated degenerative changes and central canal stenosis and marked bilateral neural  foraminal stenosis at the C4-C5 level and C5-C6 level and significant left neural foraminal stenosis at the C3-C4 level.  She had an EMG performed that was normal, without evidence of motor neuron disease.  She had a modified barium swallow done on 06/29/2015.  There was moderate pharyngeal phase dysphagia.  Regular diet with thin liquids was recommended.  A chin tuck maneuver was helpful.  Home meds with pure was also recommended.  She had a dat scan done on 07/05/2015 with abnormal significant bilateral reduction and putamen uptake with activity largely confined to the caudate nuclei bilaterally.  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  She has been in physical therapy, speech therapy and occupational therapy consistently sent April 25.  Last visit, I started her on levodopa, 100 mg 3 times per day.  She thinks that she is doing better.    She has had 2 falls since her last visit. With one, she went to the bathroom in the middle of the night and fell backward getting in the bed.  She hurt her L shoulder especially when she lifts it.  Physical therapist did recommend a Rollator and I gave her a prescription for that.  I noted that her primary care physician has given her a handicap placard.  She was supposed to have neuropsych testing done but she didn't call to schedule it when messages were left.  She is not driving and relying on friends for transportation.  Feels that swallowing is better.  Having some monocular diplopia that is vertical.     ALLERGIES:   Allergies  Allergen Reactions  . Codeine Phosphate     CURRENT MEDICATIONS:  Outpatient Encounter Prescriptions as of 09/20/2015  Medication Sig  . amLODipine (NORVASC) 2.5 MG tablet TAKE 1 TABLET EVERY DAY  . aspirin 81 MG tablet Take 81 mg by mouth daily.    Marland Kitchen atorvastatin (LIPITOR) 10 MG tablet Take 1 tablet (10 mg total) by mouth daily.  . benazepril (LOTENSIN) 40 MG tablet TAKE 1 TABLET EVERY DAY  .  Calcium Carbonate (CALTRATE 600) 1500 MG TABS Take by mouth 2 (two) times daily.    . carbidopa-levodopa (SINEMET IR) 25-100 MG tablet Take 1 tablet by mouth 3 (three) times daily.  . Cholecalciferol (VITAMIN D) 1000 UNITS capsule Take 1,000 Units by mouth daily.    . hydrochlorothiazide (HYDRODIURIL) 25 MG tablet TAKE 1 TABLET EVERY DAY  . Misc. Devices (ROLLATOR) MISC 1 Device by Does not apply route daily.  . Multiple Vitamin (MULTIVITAMIN) tablet Take 1 tablet by mouth daily.    . Omega-3 Fatty Acids (FISH OIL) 1200 MG CAPS Take 1 capsule by mouth daily.   No facility-administered encounter medications on file as of 09/20/2015.    PAST MEDICAL HISTORY:   Past Medical History  Diagnosis Date  . ALLERGIC RHINITIS 11/21/2006  . BACK PAIN 12/17/2006  . COLONIC POLYPS, HX OF 11/21/2006  . HYPERTENSION 11/21/2006  . OSTEOPENIA 08/11/2009  . OSTEOPOROSIS 11/21/2006  . PEDAL EDEMA 07/05/2008  . SYSTOLIC MURMUR [id number]    PAST SURGICAL HISTORY:   Past Surgical History  Procedure Laterality Date  . Hemorrhoid surgery    . Tonsillectomy    . Breast surgery      bx  . Cataract extraction Bilateral 2012    SOCIAL HISTORY:   Social History   Social History  . Marital Status: Single    Spouse Name: N/A  . Number of Children: N/A  . Years of Education: N/A   Occupational History  . retired     Press photographer   Social History Main Topics  . Smoking status: Former Smoker    Quit date: 03/27/1995  . Smokeless tobacco: Never Used  . Alcohol Use: No  . Drug Use: No  . Sexual Activity: Not on file   Other Topics Concern  . Not on file   Social History Narrative    FAMILY HISTORY:   Family Status  Relation Status Death Age  . Mother Deceased 84    ovarian ca  . Father Deceased 76    cad  . Sister Alive     3, one with breast CA  . Sister Deceased     37, pancreatic CA, brain tumor  . Brother Alive     1, CAD  . Brother Deceased     29  . Child Alive     2 sons, alive and well     ROS:  A complete 10 system review of systems was obtained and was unremarkable apart from what is mentioned above.  PHYSICAL EXAMINATION:    VITALS:   There were no vitals filed for this visit.  GEN:  The patient appears stated age and is in NAD. HEENT:  Normocephalic, atraumatic.  The mucous membranes are moist. The superficial temporal arteries are without ropiness or tenderness. CV:  RRR with 3/6 SEM that radiates to the right carotid. Lungs:  CTAB but she has some laryngeal stridor  Neck/HEME:  There are no carotid bruits bilaterally.  Neurological examination:  Orientation: The patient is alert and oriented x3.  Cranial nerves: There is good facial symmetry. She has facial hypomimia.  Pupils are equal round and reactive to light bilaterally. Fundoscopic exam reveals clear margins bilaterally. Extraocular muscles are intact but she does have some trouble with both upgaze and downgaze. She has near complete downgaze paralysis.  She has significant square wave jerks.  The visual fields are full to confrontational testing. The speech is nonfluent but it is fairly clear. She has intermittent grunting.  The soft palate rises symmetrically and there is no tongue deviation. Hearing is intact to conversational tone. Sensation: Sensation is intact to light touch throughout Motor: Strength is 5/5 in the bilateral upper and lower extremities.   Tongue strength is good.  Shoulder shrug is equal and symmetric.  There is no pronator drift.  No fasciculations noted anywhere, including the tongue.   Movement examination: Tone: There is no rigidity Abnormal movements: mild dyskinesia in the legs Coordination:  There is  decremation with RAM's, with any form of RAMS, including alternating supination and pronation of the forearm, hand opening and closing, finger taps, heel taps and toe taps bilaterally, L more than right Gait and Station: The patient has mild difficulty arising out of a deep-seated  chair without the use of the hands.  Stride length was very short and she is unbalanced without the rollator.  With the rollator she did very well with good stride length.  Labs:    Chemistry      Component Value Date/Time   NA 140 06/13/2015 1404   K 4.3 06/13/2015 1404   CL 99 06/13/2015 1404   CO2 30 06/13/2015 1404   BUN 16 06/13/2015 1404   CREATININE 1.10* 06/13/2015 1404   CREATININE 1.08 12/03/2014 0856      Component Value Date/Time   CALCIUM 10.2 06/13/2015 1404   CALCIUM 10.2 06/13/2015 1404   ALKPHOS 63 06/13/2015 1404   AST 18 06/13/2015 1404   ALT 15 06/13/2015 1404   BILITOT 0.6 06/13/2015 1404     Lab Results  Component Value Date   TSH 1.35 06/13/2015      ASSESSMENT/PLAN:  1.  Probable PSP  -She had a dat scan done on 07/05/2015 with abnormal significant bilateral reduction and putamen uptake with activity largely confined to the caudate nuclei bilaterally.  - She had a modified barium swallow done on 06/29/2015.  There was moderate pharyngeal phase dysphagia.  Regular diet with thin liquids was recommended.  A chin tuck maneuver was helpful.  Home meds with pure was also recommended.    - did well with PT  -better on carbidopa/levodopa 25/100 tid.  May need higher dose in future.  Mild dyskinesia today.  -I think she does have some cognitive impairment and we will schedule her for neuropsych testing.  -I talked to her about the cure PSP organization and their website.  Patient education was provided to pt and her friend.  -Talked to her about the fact that we have hired a movement disorder Education officer, museum, but she will not be with [place] and tell July.  I think she will be a great resource to this patient.  -talked to her about living alone.  States that she will move with son if needs to in future.  Friend disclosed in private after visit that son is setting up 24 hour per day care but pt doesn't know that yet.  Encouraged pt again to bring sons to  appt  -talked to her about advance directives and info given on that.  She thinks she has a living will but then tells me it doesn't address CPR/DNR or feeding tube which would be unusual.  Told her that she needs to get me a copy and ponder the issues we discussed.   2.  Follow up in 4 months, sooner should new neuro issues arise.  Much greater than 50% of this visit was spent in counseling and coordinating care.  Total face to face time:  30 min

## 2015-09-21 ENCOUNTER — Ambulatory Visit: Payer: Commercial Managed Care - HMO

## 2015-09-21 DIAGNOSIS — R1310 Dysphagia, unspecified: Secondary | ICD-10-CM

## 2015-09-21 DIAGNOSIS — R471 Dysarthria and anarthria: Secondary | ICD-10-CM | POA: Diagnosis not present

## 2015-09-21 DIAGNOSIS — R29818 Other symptoms and signs involving the nervous system: Secondary | ICD-10-CM | POA: Diagnosis not present

## 2015-09-21 DIAGNOSIS — R29898 Other symptoms and signs involving the musculoskeletal system: Secondary | ICD-10-CM | POA: Diagnosis not present

## 2015-09-21 DIAGNOSIS — R279 Unspecified lack of coordination: Secondary | ICD-10-CM | POA: Diagnosis not present

## 2015-09-21 DIAGNOSIS — R278 Other lack of coordination: Secondary | ICD-10-CM | POA: Diagnosis not present

## 2015-09-21 DIAGNOSIS — R2689 Other abnormalities of gait and mobility: Secondary | ICD-10-CM | POA: Diagnosis not present

## 2015-09-21 DIAGNOSIS — R4701 Aphasia: Secondary | ICD-10-CM

## 2015-09-21 DIAGNOSIS — R2681 Unsteadiness on feet: Secondary | ICD-10-CM | POA: Diagnosis not present

## 2015-09-21 NOTE — Patient Instructions (Signed)
We will check on your swallowing again in 6 months.  Continue to do the exercises for your swallowing. Continue to overarticulate.

## 2015-09-21 NOTE — Therapy (Signed)
[facility] [address] [place], [name], [address] Phone: [phone]   Fax:  [phone]  Speech Language Pathology Treatment  Patient Details  Name: Tiffany [name] MRN: [id number] Date of Birth: Nov 09, 1932 Referring Provider: Bluford Kaufmann, M.D.  Encounter Date: 09/21/2015      End of Session - 09/21/15 1432    Visit Number 15   Number of Visits 16   Date for SLP Re-Evaluation 09/20/15   SLP Start Time 1150   SLP Stop Time  1230   SLP Time Calculation (min) 40 min   Activity Tolerance Patient tolerated treatment well      Past Medical History  Diagnosis Date  . ALLERGIC RHINITIS 11/21/2006  . BACK PAIN 12/17/2006  . COLONIC POLYPS, HX OF 11/21/2006  . HYPERTENSION 11/21/2006  . OSTEOPENIA 08/11/2009  . OSTEOPOROSIS 11/21/2006  . PEDAL EDEMA 07/05/2008  . SYSTOLIC MURMUR 2/83/1517    Past Surgical History  Procedure Laterality Date  . Hemorrhoid surgery    . Tonsillectomy    . Breast surgery      bx  . Cataract extraction Bilateral 2012    There were no vitals filed for this visit.      Subjective Assessment - 09/21/15 1154    Subjective "If I drink and - - - - don't put my chin down." (pt coughs approx twice each meal) Dr. Carles Collet yesterday - Pt's son is setting up 24 hour care, but pt unaware of this at this time. Pt to undergo neuropsych testing.               ADULT SLP TREATMENT - 09/21/15 1158    General Information   Behavior/Cognition Alert;Cooperative;Pleasant mood   Treatment Provided   Treatment provided Dysphagia;Cognitive-Linquistic   Dysphagia Treatment   Temperature Spikes Noted No   Treatment Methods Compensation strategy training;Therapeutic exercise   Patient observed directly with PO's Yes   Type of PO's observed Thin liquids;Dysphagia 3 (soft)   Pain Assessment   Pain Assessment 0-10   Pain Score 3    Pain Location shoulder   Cognitive-Linquistic Treatment   Treatment focused on Dysarthria   Skilled Treatment In simple-mod complex conversation, pt req'd min-mod A occasionally for overarticuation.  Intelligibility was judged as 90%.   Assessment / Recommendations / Plan   Plan Discharge SLP treatment due to (comment)  max potential reached at this time   Dysphagia Recommendations   Diet recommendations Regular;Thin liquid   Liquids provided via Cup   Medication Administration Whole meds with liquid   Postural Changes and/or Swallow Maneuvers Chin tuck   Progression Toward Goals   Progression toward goals --  discharge today, see goals          SLP Education - 09/21/15 1432    Education provided Yes   Education Details HEP for swallowing   Person(s) Educated Patient   Methods Explanation;Demonstration;Verbal cues;Handout   Comprehension Verbalized understanding;Returned demonstration;Verbal cues required;Need further instruction          SLP Short Term Goals - 08/24/15 1537    SLP SHORT TERM GOAL #1   Title pt will demo compensations for speech intelligibility in 10 minutes simple conversation for intelligiblity >95%   Status Achieved   SLP SHORT TERM GOAL #2   Title pt will complete HEP for dysarthria with modified independence   Time --   Period --   Status Not Met   SLP SHORT TERM GOAL #3   Title pt will demo  functional verbal expression, using compensations, in 10 minutes simple conversation   Status Partially Met  2 pauses unnaturally in 10 minutes (unable to use compensations)   SLP SHORT TERM GOAL #4   Title pt will demo appropriate tuck posture for chin down with thin liquids during sessions 95% of the time with rare min A   Time --   Period --   Status Not Met          SLP Long Term Goals - 09-30-15 1435    SLP LONG TERM GOAL #1   Title pt will complete HEP for dysphagia with rare min A   Baseline all LTGs renewed for week of 08-15-15   Status Not Met   SLP LONG TERM GOAL #2   Title pt will demo  compensations for speech intelligibility in 10 minutes simple to mod complex conversation for intelligiblity >95%   Status Achieved   SLP LONG TERM GOAL #3   Title pt will demo functional verbal expression, using compensations, in 10 minutes mod complex conversation   Status Partially Met   SLP LONG TERM GOAL #4   Title pt will demo compensations for dysphagia in order to decr risk of aspiration 90% of the time with POs during 3 sessions   Status Not Met          Plan - 09-30-15 1433    Clinical Impression Statement  Pt demo'd functional intelligibility with 10 minutes conversation. Pt was 95-100% successful with chin tuck today without SLP cues. Pt's completion of HEP for swallowing continues as suboptimal likely due to decr'd memory. Pt is safe with current diet as she has remained free of overt s/s aspiration PNA during her therapy course. SLP made table sign for pt  today to remind her to perform chin down posture with liquids.  Pt will be diischarged at this time as she is functional with speech and swallowing.    Treatment/Interventions Aspiration precaution training;Pharyngeal strengthening exercises;Diet toleration management by SLP;SLP instruction and feedback;Compensatory strategies;Internal/external aids;Patient/family education;Functional tasks;Cueing hierarchy   Potential to Achieve Goals Fair   Potential Considerations Severity of impairments;Cooperation/participation level   Consulted and Agree with Plan of Care Patient      Patient will benefit from skilled therapeutic intervention in order to improve the following deficits and impairments:   Dysphagia  Dysarthria and anarthria  Aphasia      G-Codes - September 30, 2015 1436    Functional Assessment Tool Used NOMS-6 (20-25% impaired)   Functional Limitations Motor speech   Motor Speech Goal Status [phone]) At least 1 percent but less than 20 percent impaired, limited or restricted   Motor Speech Goal Status ([id number]) At least 1  percent but less than 20 percent impaired, limited or restricted     [place]  Visits from Start of Care: 15  Current functional level related to goals / functional outcomes: See goal updates above.  Pt's intelligibility improved over the course of therapy due to implementation of dysarthria compensations.  Pt's use of swallowing compensations improved over the therapy course, but carryover to home has been challenging, primarily due to pt decr'd memory. A reminder card was provided to pt today to remind her for performance of chin down posture with liquids. Pt's verbal expression skills have not notably improved. Pt is functional with simple conversation, and some mod complex conversation with extra time allowed.   Remaining deficits: Aphasia, dysarthria, dysphagia, memory deficits   Education / Equipment: HEP for dysarthria and dysphagia,  compensations for dysarthria, dysphagia.  Plan: Patient agrees to discharge.  Patient goals were not met. Patient is being discharged due to                                                     ?????reaching max rehab potential at this time. Pt is to cont to perform swallowing HEP until screening at this facility in approximately 6 months.        Problem List Patient Active Problem List   Diagnosis Date Noted  . PSP (progressive supranuclear palsy) ([facility]) 07/15/2015  . Aortic stenosis 06/09/2015  . Dysphasia 06/09/2015  . Aortic valve disorder 05/24/2014  . OSTEOPENIA 08/11/2009  . PEDAL EDEMA 07/05/2008  . BACK PAIN 12/17/2006  . Essential hypertension 11/21/2006  . Allergic rhinitis 11/21/2006  . Osteoporosis 11/21/2006  . History of colonic polyps 11/21/2006    [facility] 09/21/2015, 2:37 PM  [facility] [address] [place], [name], [address] Phone: [phone]   Fax:  [phone]   Name: SHEIKA COUTTS MRN: [id number] Date of Birth:  [birth year]

## 2015-10-18 ENCOUNTER — Ambulatory Visit (INDEPENDENT_AMBULATORY_CARE_PROVIDER_SITE_OTHER): Payer: Commercial Managed Care - HMO | Admitting: Psychology

## 2015-10-18 ENCOUNTER — Encounter: Payer: Self-pay | Admitting: Psychology

## 2015-10-18 DIAGNOSIS — G231 Progressive supranuclear ophthalmoplegia [Steele-Richardson-Olszewski]: Secondary | ICD-10-CM

## 2015-10-18 DIAGNOSIS — R413 Other amnesia: Secondary | ICD-10-CM

## 2015-10-18 NOTE — Progress Notes (Signed)
NEUROPSYCHOLOGICAL INTERVIEW (CPT: [id number])  Name: Tiffany [name] Date of Birth: [birth year] Date of Interview: 10/18/2015  Reason for Referral:  Tiffany [name] is a 82 y.o., widowed female who is referred for neuropsychological evaluation by Dr. 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 This patient is accompanied in the office by her daughter-in-law, Taquanda Arnaud, who supplements the history.  History of Presenting Problem:  Tiffany [name] first saw Dr. Carles Collet for neurologic consultation in March 2017 for gait and speech changes. A DAT scan on 07/05/2015 reported the revealed abnormal significant bilateral reduction in putamen uptake with activity largely confined to the caudate nuclei bilaterally. 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 At today's appointment, the patient's daughter-in-law reports that the family observed onset of physical symptoms approximately 2 years ago. Initial symptoms were reportedly choking and coughing. Her walking and mobility problems started approximately a year ago, per her daughter-in-law. She reported significant decline in balance and mobility since March 2017. The patient was having frequent falls but this has improved since last month when she started using a rolling walker. The patient was also experiencing dizziness which has also improved since increasing food intake.  With regard to cognitive functioning, the patient denies any concerns or complaints. Her daughter-in-law states that she is largely intact cognitively but that she has demonstrated some very mild decline over the past 1-2 years. Specifically, her daughter-in-law reported some forgetfulness for recent conversations. Upon direct questioning, her daughter-in-law also reported that she starts but does not finish tasks more frequently and has been demonstrating word substitutions. For example, she apparently often mixes up yes  and no. Upon direct questioning, the patient herself endorsed difficulty concentrating and word finding difficulty. The patient and her daughter-in-law denied forgetfulness for appointments, problems remembering to take medications, reduced comprehension, visual-spatial deficits or getting lost.  Tiffany [name] continues to live alone in her own home, but her family would like her to either move in with them or have an overnight caregiver. The patient wishes to remain in her own home but she does not want to have anyone with her. The patient stopped driving about [id number] months ago at her family's request. There is no history of motor vehicle accidents or getting lost. The patient has also stopped cooking or preparing meals at her family's request. She does continue to manage her medications, finances/bill paying and appointments without any reported difficulty.  With regard to mood, the patient and her daughter-in-law report that she is a little more down or depressed, secondary to her declining physical abilities. She is demonstrating reduced interest in some activities she used to enjoy, including jigsaw puzzles and computer games. However, she continues to enjoy other activities such as crossword puzzles and reading her Bible. Her daughter-in-law notes that she becomes agitated more easily, which she feels is secondary to frustration with her reduced motor skills. The patient has not experienced any hallucinations. She reported difficulty sleeping secondary to sleep maintenance problems. She denied any significant fatigue during the day. Her daughter-in-law said that she may doze off during the day. The patient has significantly reduced appetite and also has difficulty eating secondary to dysphagia, but her family has been encouraging her to eat more.  Family history is reportedly significant for dementia in 2 of the patient's 17 siblings. Otherwise there is no known family history of other neurologic  disorders.   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 Marital history: Widowed 12 years. 2 Sons. Alcohol/Tobacco/Substances: The patient does not drink alcohol. She does not use tobacco. She is a former smoker, having quit in 1997. No illicit substances.  Medical History: Past Medical History:  Diagnosis Date  . ALLERGIC RHINITIS 11/21/2006  . BACK PAIN 12/17/2006  . COLONIC POLYPS, HX OF 11/21/2006  . HYPERTENSION 11/21/2006  . OSTEOPENIA 08/11/2009  . OSTEOPOROSIS 11/21/2006  . PEDAL EDEMA 07/05/2008  . SYSTOLIC MURMUR [id number]     Current Medications:  Outpatient Encounter Prescriptions as of 10/18/2015  Medication Sig  . amLODipine (NORVASC) 2.5 MG tablet TAKE 1 TABLET EVERY DAY  . aspirin 81 MG tablet Take 81 mg by mouth daily.    Marland Kitchen atorvastatin (LIPITOR) 10 MG tablet Take 1 tablet (10 mg total) by mouth daily.  . benazepril (LOTENSIN) 40 MG tablet TAKE 1 TABLET EVERY DAY  . Calcium Carbonate (CALTRATE 600) 1500 MG TABS Take by mouth 2 (two) times daily.    . carbidopa-levodopa (SINEMET IR) 25-100 MG tablet Take 1 tablet by mouth 3 (three) times daily.  . Cholecalciferol (VITAMIN D) 1000 UNITS capsule Take 1,000 Units by mouth daily.    . hydrochlorothiazide (HYDRODIURIL) 25 MG tablet TAKE 1 TABLET EVERY DAY  . Misc. Devices (ROLLATOR) MISC 1 Device by Does not apply route daily.  . Multiple Vitamin (MULTIVITAMIN) tablet Take 1 tablet by mouth daily.    . Omega-3 Fatty Acids (FISH OIL) 1200 MG CAPS Take 1 capsule by mouth daily.   No facility-administered encounter medications on file as of 10/18/2015.      Behavioral Observations:   Appearance: Neatly and  appropriately dressed and groomed, appearing younger than her chronological age. Normal eye contact. Gait: Ambulated with a rolling walker Speech: Difficulty with motor speech production, slowed speech Thought process: Appears linear Affect: Blunted Interpersonal: Pleasant, appropriate   TESTING: There is medical necessity to proceed with neuropsychological assessment as the results will be used to aid in differential diagnosis and clinical decision-making and to inform specific treatment recommendations. Per the patient, her daughter-in-law and medical records reviewed, there has been a change in cognitive functioning and a reasonable suspicion of dementia.   PLAN: The patient will return for a full battery of neuropsychological testing with a psychometrician under my supervision. Education regarding testing procedures was provided. Subsequently, the patient will see this provider for a follow-up session at which time her test performances and my impressions and treatment recommendations will be reviewed in detail.   Full neuropsychological evaluation report to follow.

## 2015-10-26 ENCOUNTER — Ambulatory Visit (INDEPENDENT_AMBULATORY_CARE_PROVIDER_SITE_OTHER): Payer: Commercial Managed Care - HMO | Admitting: Psychology

## 2015-10-26 DIAGNOSIS — R413 Other amnesia: Secondary | ICD-10-CM | POA: Diagnosis not present

## 2015-10-26 DIAGNOSIS — G231 Progressive supranuclear ophthalmoplegia [Steele-Richardson-Olszewski]: Secondary | ICD-10-CM

## 2015-10-27 NOTE — Progress Notes (Signed)
   Neuropsychology Note  Tiffany [name] returned today for 2 hours of neuropsychological testing with technician, Milana Kidney, BS, under the supervision of Dr. Macarthur Critchley. The patient did not appear overtly distressed by the testing session, per behavioral observation or via self-report to the technician. Rest breaks were offered. Tiffany [name] will return within 2 weeks for a feedback session with Dr. Si Raider at which time her test performances, clinical impressions and treatment recommendations will be reviewed in detail. The patient understands she can contact our office should she require our assistance before this time.  Full report to follow.

## 2015-11-02 NOTE — Progress Notes (Signed)
NEUROPSYCHOLOGICAL EVALUATION   Name:    Tiffany [name]  Date of Birth:   [birth year] Date of Interview:  10/18/2015 Date of Testing:  10/26/2015   Date of Feedback:  11/03/2015      Background Information:  Reason for Referral:  Tiffany [name] is a 82 y.o., widowed female referred by Dr. Wells Guiles Tat to assess her current level of cognitive functioning and assist in differential diagnosis. Tiffany current evaluation consisted of a review of available medical records, an interview with Tiffany [name] and her daughter-in-law Tessi Sweeting), and Tiffany completion of a neuropsychological testing battery. Informed consent was obtained.  History of Presenting Problem:  Tiffany [name] first saw Dr. Carles Collet for neurologic consultation in March 2017 for gait and speech changes. A DAT scan on 07/05/2015 reported Tiffany revealed abnormal significant bilateral reduction in putamen uptake with activity largely confined to Tiffany caudate nuclei bilaterally. Tiffany [name] is diagnosed with probable PSP. At today's appointment, Tiffany [name]'s daughter-in-law reports that Tiffany family observed onset of physical symptoms approximately 2 years ago. Initial symptoms were reportedly choking and coughing. Her walking and mobility problems started approximately a year ago, per her daughter-in-law. Tiffany [name] reported significant decline in balance and mobility since March 2017. Tiffany [name] was having frequent falls but this has improved since last month when Tiffany [name] started using a rolling walker. Tiffany [name] was also experiencing dizziness which has also improved since increasing food intake.  With regard to cognitive functioning, Tiffany [name] denies any concerns or complaints. Her daughter-in-law states that Tiffany [name] is largely intact cognitively but that Tiffany [name] has demonstrated some very mild decline over Tiffany past 1-2 years. Specifically, her daughter-in-law reported some forgetfulness for recent conversations. Upon direct questioning, her daughter-in-law also reported that  Tiffany [name] starts but does not finish tasks more frequently and has been demonstrating word substitutions. For example, Tiffany [name] apparently often mixes up yes and no. Upon direct questioning, Tiffany [name] herself endorsed difficulty concentrating and word finding difficulty. Tiffany [name] and her daughter-in-law denied forgetfulness for appointments, problems remembering to take medications, reduced comprehension, visual-spatial deficits or getting lost.  Tiffany [name] continues to live alone in her own home, but her family would like her to either move in with them or have an overnight caregiver. Tiffany [name] wishes to remain in her own home but Tiffany [name] does not want to have anyone with her. Tiffany [name] stopped driving about [id number] months ago at her family's request. There is no history of motor vehicle accidents or getting lost. Tiffany [name] has also stopped cooking or preparing meals at her family's request. Tiffany [name] does continue to manage her medications, finances/bill paying and appointments without any reported difficulty.  With regard to mood, Tiffany [name] and her daughter-in-law report that Tiffany [name] is a little more down or depressed, secondary to her declining physical abilities. Tiffany [name] is demonstrating reduced interest in some activities Tiffany [name] used to enjoy, including jigsaw puzzles and computer games. However, Tiffany [name] continues to enjoy other activities such as crossword puzzles and reading her Bible. Her daughter-in-law notes that Tiffany [name] becomes agitated more easily, which Tiffany [name] feels is secondary to frustration with her reduced motor skills. Tiffany [name] has not experienced any hallucinations. Tiffany [name] reported difficulty sleeping secondary to sleep maintenance problems. Tiffany [name] denied any significant fatigue during Tiffany day. Her daughter-in-law said that Tiffany [name] may doze off during Tiffany day. Tiffany [name] has significantly reduced appetite and also has difficulty eating secondary to dysphagia, but her family has been encouraging her to eat more.  Family  history  is reportedly significant for dementia in 2 of Tiffany [name]'s 17 siblings. Otherwise there is no known family history of other neurologic disorders.   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 Marital history: Widowed 12 years. 2 Sons. Alcohol/Tobacco/Substances: Tiffany [name] does not drink alcohol. Tiffany [name] does not use tobacco. Tiffany [name] is a former smoker, having quit in 1997. No illicit substances.   Medical History:  Past Medical History:  Diagnosis Date  . ALLERGIC RHINITIS 11/21/2006  . BACK PAIN 12/17/2006  . COLONIC POLYPS, HX OF 11/21/2006  . HYPERTENSION 11/21/2006  . OSTEOPENIA 08/11/2009  . OSTEOPOROSIS 11/21/2006  . PEDAL EDEMA 07/05/2008  . SYSTOLIC MURMUR [id number]    Current medications:  Outpatient Encounter Prescriptions as of 11/03/2015  Medication Sig  . amLODipine (NORVASC) 2.5 MG tablet TAKE 1 TABLET EVERY DAY  . aspirin 81 MG tablet Take 81 mg by mouth daily.    Marland Kitchen atorvastatin (LIPITOR) 10 MG tablet Take 1 tablet (10 mg total) by mouth daily.  . benazepril (LOTENSIN) 40 MG tablet TAKE 1 TABLET EVERY DAY  . Calcium Carbonate (CALTRATE 600) 1500 MG TABS Take by mouth 2 (two) times daily.    . carbidopa-levodopa (SINEMET IR) 25-100 MG tablet Take 1 tablet by mouth 3 (three) times daily.  . Cholecalciferol (VITAMIN D) 1000 UNITS capsule Take 1,000 Units by mouth daily.    . hydrochlorothiazide (HYDRODIURIL) 25 MG tablet TAKE 1 TABLET EVERY DAY  . Misc. Devices (ROLLATOR) MISC 1 Device by Does not apply route daily.  . Multiple Vitamin (MULTIVITAMIN) tablet Take 1 tablet by mouth daily.    . Omega-3 Fatty Acids (FISH OIL) 1200 MG CAPS Take 1 capsule by mouth  daily.   No facility-administered encounter medications on file as of 11/03/2015.      Current Examination:  Behavioral Observations:  Appearance: Neatly and appropriately dressed and groomed, appearing younger than her chronological age. Normal eye contact. Gait: Ambulated with a rolling walker Speech: Difficulty with motor speech production, slowed speech Thought process: Appears linear Affect: Blunted Interpersonal: Pleasant, appropriate Orientation: Oriented to all spheres   Tests Administered: . Test of Premorbid Functioning (TOPF) . Wechsler Adult Intelligence Scale-Fourth Edition (WAIS-IV): Similarities, Block Design, Matrix Reasoning, Coding and Digit Span subtests . [employer] Verbal Learning Test - 2nd Edition (CVLT-2) Short Form . Repeatable Battery for Tiffany Assessment of Neuropsychological Status (RBANS) Form A:  Figure Copy and Recall subtests and Story Memory and Recall subtests . Neuropsychological Assessment Battery (NAB) Language Module, Form 1: Auditory Comprehension and Naming Subtests . Controlled Oral Word Association Test (COWAT) . Trail Making Test A and B . Clock drawing test . Geriatric Depression Scale (GDS) 15 Item . Generalized Anxiety Disorder - 7 item screener (GAD-7)  Test Results: Note: Standardized scores are presented only for use by appropriately trained professionals and to allow for any future test-retest comparison. These scores should not be interpreted without consideration of all Tiffany information that is contained in Tiffany rest of Tiffany report. Tiffany most recent standardization samples from Tiffany test publisher or other sources were used whenever possible to derive standard scores; scores were corrected for age, gender, ethnicity and education when available.    Test Name Standardized Score Descriptor  TOPF SS= 99 Average  WAIS-IV Subtests  Similarities ss= 2 Impaired  Block Design ss= 7 Low average  Matrix Reasoning ss= 9 Average  Coding ss= 6  Low average  Digit Span  ss= 9 Average  RBANS Subtests    Figure Copy Z= -2.7 Severely impaired  Figure Recall Z= -0.1 Average  Story Memory Z= -0.9 Low average  Story Recall Z= 0.6 Average  CVLT-II Scores    Trial 1 Z= -2.5 Impaired  Trial 4 Z= -1 Low average  Trials 1-4 total T= 40 Low average  SD Free Recall Z= 0.5 Average  LD Free Recall Z= 0 Average  LD Cued Recall Z= -0.5 Average  Recognition Discriminability (8/9 hits, 0 false positives) Z= 0.5 Average  NAB Language subtests    Auditory comprehension T= 44 Average  Naming T= 37 Low average  COWAT-FAS T= 19 Severely impaired  COWAT-Animals T= 21 Severely impaired  Trail Making Test A 0 errors T= 36 Borderline  Trail Making Test B 1 error T= 33 Borderline  Clock Drawing  Impaired  GDS-15 7/15 Mild  GAD-7 2/21 WNL                  Description of Test Results:  Premorbid verbal intellectual abilities were estimated to have been within Tiffany average range based on a test of word reading. Psychomotor processing speed was low average to borderline impaired. Auditory attention and working memory were average. Visual-spatial construction was variable. Specifically, construction of three dimensional blocks to match a model was low average, while her written reproduction of a complex geometric figure was severely impaired. On Tiffany latter, there were no omissions, but her drawing precision was reduced presumably due to decreased dexterity. Language abilities were variable. Specifically, confrontation naming was low average, while semantic verbal fluency was severely impaired. Auditory comprehension was average. With regard to verbal memory, encoding and acquisition of non-contextual information (i.e., word list) was low average across four learning trials. Tiffany [name] demonstrated significant benefit from repetition of to be learned material. After a brief distracter task, free recall was average. After a delay, free recall was average.  Cued recall was average. Performance on a yes/no recognition task was average. On another verbal memory test, encoding and acquisition of contextual auditory information (i.e., short story) was low average. After a delay, free recall was average. With regard to non-verbal memory, delayed free recall of visual information was average. Executive functioning was variable. Mental flexibility and set-shifting were borderline impaired on Trails B; Tiffany [name] committed one set loss error. Verbal fluency with phonemic search restrictions was severely impaired. Verbal abstract reasoning was impaired. Non-verbal abstract reasoning was average. Performance on a clock drawing task was impaired due to micrographia; however, Tiffany [name] did indicate Tiffany time correctly. On self-report questionnaires, Tiffany [name] endorsed symptoms of mild depression, secondary to rapid changes in her functioning and independence. Tiffany [name] did not endorse significant anxiety.   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 Results of this evaluation revealed intact information processing speed, attention and working memory, visual spatial skills, verbal and nonverbal memory, and aspects of language (e.g., confrontation naming, auditory comprehension. Meanwhile, Tiffany [name] demonstrated deficits in verbal fluency and executive functioning (particularly abstract reasoning). This cognitive profile is commonly seen in progressive supranuclear palsy (PSP) and is reflective of frontal-subcortical dysfunction. Her testing results and current level of functioning do not indicate dementia at this time. Instead, a diagnosis of non-amnestic mild cognitive impairment is warranted. Tiffany [name] is also reporting mild adjustment-related depression  secondary to her rapidly progressing illness and reduced independence.      Recommendations/Plan: Based on Tiffany findings of Tiffany present evaluation, Tiffany  following recommendations are offered:   1. While Tiffany [name] is demonstrating forgetfulness in daily life, her testing performances show that Tiffany [name] will likely benefit from cueing to aid recall. To that end, Tiffany [name] will likely benefit from having a large message board and/or calendar with important information that Tiffany [name] can easily see. Tiffany [name] may need reminders in order to complete tasks or recall information.  2. Because of relatively rapid progression of PSP, coupled with reduced problem solving and executive functioning, oversight of complex ADLs is recommended, with a plan in place for when Tiffany [name] can no longer manage these. I agree that it will be helpful for her to have a caregiver in her home, or for her to move into a family member's home. It is not absolutely necessary at this time, from a cognitive standpoint, but it is my recommendation that this get in place before it becomes such, in order to make Tiffany transition smoother. 3. Quality of life will be maximized by continuing to participate in activities that provide enjoyment and a sense of control. For example, listening to music Tiffany [name] enjoys, reading, visiting with family and friends, and completing large-piece jigsaw puzzles may provide some enjoyment. Tiffany [name] will benefit from a daily routine and regular schedule of activities, given her executive dysfunction and Tiffany tendency for apathy to be present in PSP.  4. Her family should continue to monitor her mood. Tiffany [name] is reporting some adjustment-related depression right now, but increased irritability/agitation and quick emotional shifts are common in PSP and may start to occur, in which case medication could be helpful.    Feedback to [name]: Tiffany [name] returned for a feedback appointment on 11/02/2015 to review Tiffany results of her neuropsychological evaluation with this provider. 30 minutes face-to-face time was spent reviewing her test results, my impressions and my recommendations as detailed  above.    Total time spent on this [name]'s case: [id number] unit for interview with psychologist; [phone] units of testing by psychometrician under psychologist's supervision; [phone] units for medical record review, scoring of neuropsychological tests, interpretation of test results, preparation of this report, and review of results to Tiffany [name] by psychologist.      Thank you for your referral of Lyn[facility]pp[facility]. Please feel free to contact me if you have any questions or concerns regarding this report.

## 2015-11-03 ENCOUNTER — Ambulatory Visit (INDEPENDENT_AMBULATORY_CARE_PROVIDER_SITE_OTHER): Payer: Commercial Managed Care - HMO | Admitting: Psychology

## 2015-11-03 DIAGNOSIS — R413 Other amnesia: Secondary | ICD-10-CM | POA: Diagnosis not present

## 2015-11-03 DIAGNOSIS — G231 Progressive supranuclear ophthalmoplegia [Steele-Richardson-Olszewski]: Secondary | ICD-10-CM | POA: Diagnosis not present

## 2015-11-03 DIAGNOSIS — G3184 Mild cognitive impairment, so stated: Secondary | ICD-10-CM | POA: Diagnosis not present

## 2015-11-03 NOTE — Patient Instructions (Signed)
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 Results of this evaluation revealed intact information processing speed, attention and working memory, visual spatial skills, verbal and nonverbal memory, and aspects of language (e.g., confrontation naming, auditory comprehension. Meanwhile, she demonstrated deficits in verbal fluency and executive functioning (particularly abstract reasoning). This cognitive profile is commonly seen in progressive supranuclear palsy (PSP) and is reflective of frontal-subcortical dysfunction. Her testing results and current level of functioning do not indicate dementia at this time. Instead, a diagnosis of non-amnestic mild cognitive impairment is warranted. The patient is also reporting mild adjustment-related depression secondary to her rapidly progressing illness and reduced independence.      Recommendations/Plan: Based on the findings of the present evaluation, the following recommendations are offered:   1. While the patient is demonstrating forgetfulness in daily life, her testing performances show that she will likely benefit from cueing to aid recall. To that end, she will likely benefit from having a large message board and/or calendar with important information that she can easily see. She may need reminders in order to complete tasks or recall information.  2. Because of relatively rapid progression of PSP, coupled with reduced problem solving and executive functioning, oversight of complex ADLs is recommended, with a plan in place for when the patient can no longer manage these. I agree that it will be helpful for her to have a caregiver in her home, or for her to move into a family member's home. It is not absolutely necessary at this time, from a cognitive standpoint, but it is my recommendation that this get in place before it becomes such, in order to make the transition  smoother. 3. Quality of life will be maximized by continuing to participate in activities that provide enjoyment and a sense of control. For example, listening to music she enjoys, reading, visiting with family and friends, and completing large-piece jigsaw puzzles may provide some enjoyment. She will benefit from a daily routine and regular schedule of activities, given her executive dysfunction and the tendency for apathy to be present in PSP.  4. Her family should continue to monitor her mood. She is reporting some adjustment-related depression right now, but increased irritability/agitation and quick emotional shifts are common in PSP and may start to occur, in which case medication could be helpful.

## 2015-11-04 ENCOUNTER — Telehealth: Payer: Self-pay | Admitting: Neurology

## 2015-11-04 MED ORDER — CARBIDOPA-LEVODOPA 25-100 MG PO TABS
1.0000 | ORAL_TABLET | Freq: Three times a day (TID) | ORAL | 3 refills | Status: DC
Start: 2015-11-04 — End: 2015-12-22

## 2015-11-04 NOTE — Telephone Encounter (Signed)
-----   Message from [place], DO sent at 11/03/2015  4:49 PM EDT ----- Regarding: FW: patient questions Jade,   Put in as phone encounter.  1.  No problem 2.  Pharmacy can break into 1 month supplies ----- Message ----- From: Kandis Nab, PsyD Sent: 11/03/2015   4:03 PM To: Eustace Quail Tat, DO Subject: patient questions                              Tiffany [name] and her daughter-in-law had two questions for you at their appt today: 1. Tiffany [name] is scheduled for an eye exam soon, and her daughter-in-law wants to make sure it is okay that she get her eyes dilated. 2. Tiffany [name] would like a 32-month supply (instead of 8-month supply as it is currently written) refill of her carbidopa-levodopa.   I told them I would get these questions to you. Thanks!

## 2015-11-04 NOTE — Telephone Encounter (Signed)
Spoke with patient and she was made aware of Dr. Doristine Devoid recommendations.

## 2015-11-04 NOTE — Telephone Encounter (Signed)
Left message on machine for patient to call back.  RX sent to the pharmacy in one month supplies.

## 2015-12-01 ENCOUNTER — Encounter: Payer: Self-pay | Admitting: Internal Medicine

## 2015-12-01 DIAGNOSIS — H04123 Dry eye syndrome of bilateral lacrimal glands: Secondary | ICD-10-CM | POA: Diagnosis not present

## 2015-12-01 DIAGNOSIS — H353120 Nonexudative age-related macular degeneration, left eye, stage unspecified: Secondary | ICD-10-CM | POA: Diagnosis not present

## 2015-12-01 DIAGNOSIS — H353110 Nonexudative age-related macular degeneration, right eye, stage unspecified: Secondary | ICD-10-CM | POA: Diagnosis not present

## 2015-12-01 DIAGNOSIS — Z961 Presence of intraocular lens: Secondary | ICD-10-CM | POA: Diagnosis not present

## 2015-12-05 ENCOUNTER — Other Ambulatory Visit: Payer: Self-pay | Admitting: Internal Medicine

## 2015-12-07 DIAGNOSIS — Z23 Encounter for immunization: Secondary | ICD-10-CM | POA: Diagnosis not present

## 2015-12-21 ENCOUNTER — Ambulatory Visit: Payer: Commercial Managed Care - HMO | Admitting: Neurology

## 2015-12-21 NOTE — Progress Notes (Signed)
Tiffany [name] was seen today in the movement disorders clinic for neurologic consultation at the request of Nyoka Cowden, MD.  The patient is seen today in neurologic consultation for the subacute evaluation of gait and speech changes for the last several months.  Looking back, she thinks that her handwriting began to change after Thanksgiving some time.  She remembers it being difficult to write her cards at thanksgiving, as the handwriting was smaller and less fluid.  She thinks that she began to have difficulty with word finding about 2 months ago.  She presented to the emergency room at [facility] in [place] on February 27 and I reviewed those records.  She had an MRI of the brain there and I do not have the films, but I do have the report.  It was nonacute and only revealed mild white matter changes.  She was told to follow up with ENT.  She did not have an ENT consult but she did follow up with her primary care physician on 06/03/2015 and was set up with a speech therapy evaluation, cardiology follow-up and was referred here.  She had a carotid ultrasound on 06/06/2015 which just demonstrated 1-39% stenosis.  I reviewed her cardiology follow-up from 06/09/2015 and they did not think that her new symptoms were related to her aortic stenosis.  Her repeat echocardiogram is pending.  I did get a note from her speech therapist and reviewed their records as well.  It was felt that she had both dysphasia as well as dysphagia and a modified barium swallow as well as traditional speech, occupational and physical therapy are being recommended.  Denies muscles jumping.  Is having trouble swallowing.  Only new medication is Lipitor and she started that a few weeks ago.    07/15/15 update:  The patient follows up today after having multiple tests performed.  She had an MRI of the cervical spine on April, 2017 that demonstrated degenerative changes and central canal stenosis and marked bilateral neural  foraminal stenosis at the C4-C5 level and C5-C6 level and significant left neural foraminal stenosis at the C3-C4 level.  She had an EMG performed that was normal, without evidence of motor neuron disease.  She had a modified barium swallow done on 06/29/2015.  There was moderate pharyngeal phase dysphagia.  Regular diet with thin liquids was recommended.  A chin tuck maneuver was helpful.  Home meds with pure was also recommended.  She had a dat scan done on 07/05/2015 with abnormal significant bilateral reduction and putamen uptake with activity largely confined to the caudate nuclei bilaterally.  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  She has been in physical therapy, speech therapy and occupational therapy consistently sent April 25.  Last visit, I started her on levodopa, 100 mg 3 times per day.  She thinks that she is doing better.    She has had 2 falls since her last visit. With one, she went to the bathroom in the middle of the night and fell backward getting in the bed.  She hurt her L shoulder especially when she lifts it.  Physical therapist did recommend a Rollator and I gave her a prescription for that.  I noted that her primary care physician has given her a handicap placard.  She was supposed to have neuropsych testing done but she didn't call to schedule it when messages were left.  She is not driving and relying on friends for transportation.  Feels that swallowing is better.  Having some monocular diplopia that is vertical.    12/22/15 update:  The patient follows up today.  She is accompanied by her daughter in law who supplements the history.  She is on carbidopa/levodopa 25/100, one tablet 3 times per day (5:30am/11am/5pm).  She does not think that the medication is helping.  No SE with the medication.  She had 1 fall at her daughter in laws house getting out of the bed.  Didn't get hurt.  Family helped her up.  No lightheadedness or near syncope.  No  hallucinations.  She does not drive.  She does continue to have some diplopia.  She had neuropsych testing with Dr. Si Raider on 10/26/2015.  There was evidence of mild cognitive impairment and depression.  Pt denies any depression.  There was no evidence of dementia.  Lives alone but staying with her family m-w and then son takes her to church on wed night and daughter in law comes thurs.  She is alone Friday night.  Son comes to her house some Saturday and sundays, her other son takes her to church.  Family concerned about her being alone but pt absolutely refuses to leave home or let someone come in.  No swallowing issues or problems with choking.  Daughter in law states that she has had some choking.  One choking episode in a restaurant on liquid this past week.     ALLERGIES:   Allergies  Allergen Reactions  . Codeine Phosphate     CURRENT MEDICATIONS:  Outpatient Encounter Prescriptions as of 12/22/2015  Medication Sig  . aspirin 81 MG tablet Take 81 mg by mouth daily.    . benazepril (LOTENSIN) 40 MG tablet TAKE 1 TABLET EVERY DAY  . Calcium Carbonate (CALTRATE 600) 1500 MG TABS Take by mouth 2 (two) times daily.    . carbidopa-levodopa (SINEMET IR) 25-100 MG tablet Take 1 tablet by mouth 3 (three) times daily.  . Cholecalciferol (VITAMIN D) 1000 UNITS capsule Take 1,000 Units by mouth daily.    . hydrochlorothiazide (HYDRODIURIL) 25 MG tablet TAKE 1 TABLET EVERY DAY  . Multiple Vitamin (MULTIVITAMIN) tablet Take 1 tablet by mouth daily.    . Omega-3 Fatty Acids (FISH OIL) 1200 MG CAPS Take 1 capsule by mouth daily.  . [DISCONTINUED] amLODipine (NORVASC) 2.5 MG tablet TAKE 1 TABLET EVERY DAY  . [DISCONTINUED] atorvastatin (LIPITOR) 10 MG tablet Take 1 tablet (10 mg total) by mouth daily.  . [DISCONTINUED] Misc. Devices (ROLLATOR) MISC 1 Device by Does not apply route daily.   No facility-administered encounter medications on file as of 12/22/2015.     PAST MEDICAL HISTORY:   Past  Medical History:  Diagnosis Date  . ALLERGIC RHINITIS 11/21/2006  . BACK PAIN 12/17/2006  . COLONIC POLYPS, HX OF 11/21/2006  . HYPERTENSION 11/21/2006  . OSTEOPENIA 08/11/2009  . OSTEOPOROSIS 11/21/2006  . PEDAL EDEMA 07/05/2008  . SYSTOLIC MURMUR [id number]    PAST SURGICAL HISTORY:   Past Surgical History:  Procedure Laterality Date  . BREAST SURGERY     bx  . CATARACT EXTRACTION Bilateral 2012  . HEMORRHOID SURGERY    . TONSILLECTOMY      SOCIAL HISTORY:   Social History   Social History  . Marital status: Single    Spouse name: N/A  . Number of children: N/A  . Years of education: N/A   Occupational History  . retired     Press photographer   Social History Main Topics  .  Smoking status: Former Smoker    Quit date: 03/27/1995  . Smokeless tobacco: Never Used  . Alcohol use No  . Drug use: No  . Sexual activity: Not on file   Other Topics Concern  . Not on file   Social History Narrative  . No narrative on file    FAMILY HISTORY:   Family Status  Relation Status  . Mother Deceased at age 32   ovarian ca  . Father Deceased at age 38   cad  . Sister Alive   3, one with breast CA  . Sister Deceased   55, pancreatic CA, brain tumor  . Brother Alive   1, CAD  . Brother Deceased   15  . Child Alive   2 sons, alive and well    ROS:  A complete 10 system review of systems was obtained and was unremarkable apart from what is mentioned above.  PHYSICAL EXAMINATION:    VITALS:   Vitals:   12/22/15 1306  BP: (!) 150/80  Pulse: 94  Weight: 149 lb (67.6 kg)  Height: 5\' 4"  (1.626 m)    GEN:  The patient appears stated age and is in NAD. HEENT:  Normocephalic, atraumatic.  The mucous membranes are moist. The superficial temporal arteries are without ropiness or tenderness. CV:  RRR with 3/6 SEM that radiates to the right carotid. Lungs:  CTAB but she has some laryngeal stridor Neck/HEME:  There are no carotid bruits bilaterally.  Neurological  examination:  Orientation: The patient is alert and oriented x3.  Cranial nerves: There is good facial symmetry. She has facial hypomimia.  Pupils are equal round and reactive to light bilaterally. Fundoscopic exam reveals clear margins bilaterally. Extraocular muscles are intact but she does have some trouble with both upgaze and downgaze. She has near complete downgaze paralysis.  She has significant square wave jerks.  The visual fields are full to confrontational testing. The speech is nonfluent but it is fairly clear. She has intermittent grunting.  The soft palate rises symmetrically and there is no tongue deviation. Hearing is intact to conversational tone. Sensation: Sensation is intact to light touch throughout Motor: Strength is 5/5 in the bilateral upper and lower extremities.   Tongue strength is good.  Shoulder shrug is equal and symmetric.  There is no pronator drift.  No fasciculations noted anywhere, including the tongue.   Movement examination: Tone: There is no rigidity Abnormal movements: no dyskinesia today Coordination:  There is good RAMs today Gait and Station: The patient has mild difficulty arising out of a deep-seated chair without the use of the hands and makes 2 attempts and starts to fall back on the 2nd attempt but was caught by examiner.  Stride length was much better and she swings the arms.   Labs:    Chemistry      Component Value Date/Time   NA 140 06/13/2015 1404   K 4.3 06/13/2015 1404   CL 99 06/13/2015 1404   CO2 30 06/13/2015 1404   BUN 16 06/13/2015 1404   CREATININE 1.10 (H) 06/13/2015 1404      Component Value Date/Time   CALCIUM 10.2 06/13/2015 1404   CALCIUM 10.2 06/13/2015 1404   ALKPHOS 63 06/13/2015 1404   AST 18 06/13/2015 1404   ALT 15 06/13/2015 1404   BILITOT 0.6 06/13/2015 1404     Lab Results  Component Value Date   TSH 1.35 06/13/2015      ASSESSMENT/PLAN:  1.  Probable PSP  -  She had a dat scan done on 07/05/2015 with  abnormal significant bilateral reduction and putamen uptake with activity largely confined to the caudate nuclei bilaterally.  - She had a modified barium swallow done on 06/29/2015.  There was moderate pharyngeal phase dysphagia.  Regular diet with thin liquids was recommended.  A chin tuck maneuver was helpful.  Home meds with pure was also recommended.  She had stopped taking meds with pure and has had more trouble with them.  Told her to restart that.  She has had a few more choking episodes.  Reminded her about the chin tuck maneuver.  May need to repeat the swallow study in the near future  - did well with PT  -Slightly increase carbidopa/levodopa 25/100 to 2/1/1.  Clinically looks better although pt not sure that it helps  -gave her information to our PD social worker  -talked to her again about living alone.  Very resistant to moving or having someone is in the home.  Her family is doing a great job trying to caregivers, but are not with her all of the time.  We talked about various options.  We will get a [facility] consult as well.  -talked to her about advance directives and she was clear with me and her daughter in law that she absolutely wanted to be DO NOT RESUSCITATE.  Her daughter-in-law asked her multiple times, and she was very clear about this.  I asked her about it in several different ways, including reading to her what exactly is meant by DO NOT RESUSCITATE.  She expressed very clear understanding that she did not want this (sometimes she confuses yes/no and that is why I asked her many times, as did her daughter-in-law) 2.  Mild cognitive impairment  -She had neuropsych testing with Dr. Si Raider on 10/26/2015.  There was evidence of mild cognitive impairment and depression.  There was no evidence of dementia.  -Concerned about living alone, primarily because of physical status, but also mental status. 3.  Spent 55 minutes in the room with the patient today.  Much greater than 50% of this  visit was spent in counseling.

## 2015-12-22 ENCOUNTER — Encounter: Payer: Self-pay | Admitting: Neurology

## 2015-12-22 ENCOUNTER — Ambulatory Visit (INDEPENDENT_AMBULATORY_CARE_PROVIDER_SITE_OTHER): Payer: Commercial Managed Care - HMO | Admitting: Neurology

## 2015-12-22 VITALS — BP 150/80 | HR 94 | Ht 64.0 in | Wt 149.0 lb

## 2015-12-22 DIAGNOSIS — G3184 Mild cognitive impairment, so stated: Secondary | ICD-10-CM

## 2015-12-22 DIAGNOSIS — G231 Progressive supranuclear ophthalmoplegia [Steele-Richardson-Olszewski]: Secondary | ICD-10-CM

## 2015-12-22 DIAGNOSIS — F458 Other somatoform disorders: Secondary | ICD-10-CM

## 2015-12-22 DIAGNOSIS — R1319 Other dysphagia: Secondary | ICD-10-CM

## 2015-12-22 MED ORDER — CARBIDOPA-LEVODOPA 25-100 MG PO TABS: ORAL_TABLET | ORAL | 1 refills | Status: DC

## 2015-12-22 NOTE — Patient Instructions (Addendum)
Take carbidopa/levodopa 25/100, 2 tablets in the AM, 1 in the afternoon and 1 in the evening  [employer] that can locate patients outside the home:  http://mobilealertsystems.com/ (724) 538-9329 http://www.lifelinesys.com/content/lifeline-products/get-life-gosafe  747-066-3653 www.verizonwireless.com/sure/ (915) 698-9947  Medial Alert systems that link to smart phones:  http://www.lifelinesys.com/content/lifeline-products/response-app https://www.stanton.info/ RecycleRoad.pl.aspx  [employer] GPS Tracker:  VoiceZap.is 5811019545

## 2015-12-22 NOTE — Addendum Note (Signed)
Addended byAnnamaria Helling on: 12/22/2015 03:19 PM   Modules accepted: Orders

## 2015-12-30 ENCOUNTER — Other Ambulatory Visit: Payer: Self-pay

## 2015-12-30 NOTE — Patient Outreach (Signed)
[facility]) Care Management  12/30/2015  CHARMAE ROEHR 10-23-1932 [id number]   Telephonic Screen   Referral Date: 12/23/15 Source:  [facility] Issue:  Evaluate for patient home safety and living alone.  Patient has progressive supranuclear palsy and frequent falls.   Outreach call #1 to patient.  Patient not reached.  RN CM left HIPAA compliant voice message with name and number.  RN CM scheduled for next contact call within one week.   Nathaneil Canary, BSN, RN, [facility] Care Management Coordinator [phone] Direct [phone] Cell [phone] Office [phone] Fax [email or web address]

## 2016-01-04 ENCOUNTER — Ambulatory Visit: Payer: Self-pay

## 2016-01-05 ENCOUNTER — Other Ambulatory Visit: Payer: Self-pay

## 2016-01-05 NOTE — Patient Outreach (Addendum)
[facility]) Care Management  01/05/2016  Tiffany [name] Aug 04, 1932 [id number]   Telephonic Screening and Initial Assessment   Referral Date: 12/23/15  Source:  [facility] Issue:  Evaluate for patient home safety and living alone.  Patient has progressive supranuclear palsy and frequent falls.   Subjective:   States h/o patient was noted to have slurring of words, choking, falling and family thought patient was having a stroke but was diagnosed with Progressive Supranuclear Palsy (PSP) 04/2015.   States awareness that this disease process is progressive and has affected patients vision and mobility.  Confirmed plan for LTC is to keep patient in the home and provide care.  Supportive family managing all needs at this time.   Providers: Primary MD: Dr.Peter  Burnice Logan  -  last appt: August 2017  /  next appt: 03/05/16 Neurologist: Dr. Eustace Quail.Tat - last appt  12/22/15 - next appt:  03/2016 HH: none Insurance:  [employer]   Social: Patient lives in her home alone.  Family providing 24/7 care with exception of 1-2 overnights alone.   [facility] also providing supportive services.   Patient remains active and attending Senior Meetings with family assistance.  Mobility:  Walker Falls: several since diagnosis of PSP  (04/2015) but no falls over the past 2 months (10/2015). Pain: none  Depression: none  Transportation: family, friends Caregiver: Daughter-in-law, Andrina Locken (COPD health issues and runs a cleaning business but provides as much care as needed).  Emergency Contact:  Felipa Eth or  Richard Horger/Son [phone]. Advance Directive: yes - HCPOA - Richard   On file under Media dated 10/24/2015 Consent:  Yes - Patient gave verbal consent to discuss PHI with Felipa Eth and also has HCPOA, Eliseo Gum.  Resources: SS and pension; patient has no LTC insurance DME: walker,  shower chair without back,  BP cuff and scales, life alert, reading  glasses, Bilateral hearing aids  Co-morbidities: HTN, Progressive Supranuclear Palsy (PSP) 04/2015. Admissions: 0 ER visits: 0 H/o patient was noted to have slurring of words, choking, falling and family thought patient was having a stroke but was diagnosed with Progressive Supranuclear Palsy (PSP) 04/2015.   States awareness that this disease process is progressive and has affected patients vision (Can not see to the sides or up and up and down) and mobility (several falls since 04/2015 but none over the past 2 months).  Confirmed plan for LTC is to keep patient in the home and provide care.  Supportive family managing all needs at this time.  BP 150/80 12/22/2015 Weight 149 lb (68 kg) 12/22/2015 Height 64 in (163 cm) 12/22/2015 BMI 25.60 (Overweight, Pre-obese 12/22/2015  Medications:  Patient taking less than 15 medications  Co-pay cost issues: none Flu Vaccine: 11/2015 Prevnar (PCV13) [facility] 11/12/2013 Pneumovax (PPSV2 02/09/2010 tDAP Vaccine N/D Medication Reconciliation completed with patient 01/05/2016   Encounter Medications:  Outpatient Encounter Prescriptions as of 01/05/2016  Medication Sig  . aspirin 81 MG tablet Take 81 mg by mouth daily.    . benazepril (LOTENSIN) 40 MG tablet TAKE 1 TABLET EVERY DAY  . Calcium Carbonate (CALTRATE 600) 1500 MG TABS Take by mouth 2 (two) times daily.    . carbidopa-levodopa (SINEMET IR) 25-100 MG tablet 2 in AM, 1 in afternoon, 1 in evening  . Cholecalciferol (VITAMIN D) 1000 UNITS capsule Take 1,000 Units by mouth daily.    . hydrochlorothiazide (HYDRODIURIL) 25 MG tablet TAKE 1 TABLET EVERY DAY  . Multiple Vitamin (MULTIVITAMIN) tablet  Take 1 tablet by mouth daily.    . Omega-3 Fatty Acids (FISH OIL) 1200 MG CAPS Take 1 capsule by mouth daily.   No facility-administered encounter medications on file as of 01/05/2016.     Functional Status:  In your present state of health, do you have any difficulty performing the following activities:  01/05/2016  Hearing? Y  Vision? Y  Difficulty concentrating or making decisions? N  Walking or climbing stairs? Y  Dressing or bathing? Y  Doing errands, shopping? Y  Preparing Food and eating ? Y  Using the Toilet? N  In the past six months, have you accidently leaked urine? N  Do you have problems with loss of bowel control? N  Managing your Medications? N  Managing your Finances? N  Housekeeping or managing your Housekeeping? Y  Some recent data might be hidden    Fall/Depression Screening: PHQ 2/9 Scores 01/05/2016 01/05/2016 12/03/2014 11/12/2013  PHQ - 2 Score 0 0 0 0    Fall Risk  01/05/2016 12/22/2015 09/20/2015 12/03/2014 11/12/2013  Falls in the past year? - No Yes No No  Number falls in past yr: - - 2 or more - -  Injury with Fall? - - Yes - -  Risk Factor Category  - - High Fall Risk - -  Risk for fall due to : History of fall(s);Impaired balance/gait;Impaired mobility;Impaired vision;Medication side effect - Impaired balance/gait;Impaired mobility - -  Follow up - - Falls evaluation completed;Education provided;Falls prevention discussed - -     Preventives: Hearing: Bilateral aids - last screen - 06/2015 Eyes: Dr. Gabriel Rainwater see to the sides or up and down. Opthalmologist:  Dr. Shirlyn Goltz - last appt  12/01/15 Dentist:  Dr. Lendell Caprice - next appt 02/04/2016 Mammogram: 07/13/2015 Bone Density: unknown Colonoscopy: last once completed in 1978  - No longer recommended.    Plan:  Referral Date: 12/23/15  Telephonic Screening and Initial Assessment 01/05/16 Telephonic RN CM Date: 01/05/2016 Program:  Other:  Progressive Supranuclear Palsy   THN Telephonic RN CM Referral  -Disease Management and Education  -Fall Prevention  -Monitor Level of care needs in the home.  -Monitor for [employer] to assist with care in the home.   RN CM advised in next [facility] scheduled contact call within next 30 days for monthly assessment and / or  care coordination services as needed. RN CM  advised to please notify MD of any changes in condition prior to scheduled appt's.   RN CM provided contact name and # [phone] or main office # [phone] and 24-hour nurse line # [phone].  RN CM confirmed patient is aware of 911 services for urgent emergency needs.  RN CM notified [facility] Care Management Assistant: agreed to services/case opened. RN CM sent successful outreach letter and  [facility] Introductory package. RN CM sent Physician Enrollment/Barriers Letter and Initial Assessment to Primary MD  [facility] CM Care Plan Problem One   Flowsheet Row Most Recent Value  Care Plan Problem One  Knowledge deficit associated to self management interventions for PSP.   Role Documenting the Problem One  Care Management Telephonic Coordinator  Care Plan for Problem One  Active  THN Long Term Goal (31-90 days)  Patient and family will improve knowledge of PSP knowledge and management over the next 31-90 days.   THN Long Term Goal Start Date  01/05/16  Interventions for Problem One Long Term Goal  RN CM will provide education to patient and family on PSP over the next 31-90 days.  THN CM Short Term Goal #1 (0-30 days)  Patient will continue to manage meds as long as no safety concerns identified from vision complications associated to PSP over the next 30 days.   THN CM Short Term Goal #1 Start Date  01/05/16  Interventions for Short Term Goal #1  RN CM will provide education on options of medication mangment over the next 30 days.     THN CM Care Plan Problem Two   Flowsheet Row Most Recent Value  Care Plan Problem Two  H/o falls associated to complications of PSP  Role Documenting the Problem Two  Care Management Telephonic Coordinator  Care Plan for Problem Two  Active  THN CM Short Term Goal #1 (0-30 days)  Patient will have no falls over the next 30 days.   THN CM Short Term Goal #1 Start Date  01/05/16  Interventions for Short Term Goal #2   RN CM will continue to assess, monitor and teach  fall prevention over the next 30 days.     [facility] CM Care Plan Problem Three   Flowsheet Row Most Recent Value  Care Plan Problem Three  LTC Plan of care  Role Documenting the Problem Three  Care Management Telephonic Coordinator  Care Plan for Problem Three  Active  THN CM Short Term Goal #1 (0-30 days)  Pateint and family will discuss LTC needs over the next 30 days.   THN CM Short Term Goal #1 Start Date  01/05/16  Interventions for Short Term Goal #1  RN CM will provide education on options for LTC needs to be met in the home over the next 30 days.        Nathaneil Canary, BSN, RN, [place] Management Care Management Coordinator [phone] Direct [phone] Cell [phone] Office [phone] Fax [email or web address]

## 2016-01-09 ENCOUNTER — Other Ambulatory Visit: Payer: Self-pay | Admitting: Internal Medicine

## 2016-02-02 ENCOUNTER — Other Ambulatory Visit: Payer: Self-pay

## 2016-02-02 NOTE — Patient Outreach (Signed)
[facility]) Care Management  02/02/2016  Tiffany [name] 1932/09/15 [id number]   Telephonic Monthly Assessment   Referral Date: 12/23/15  Telephonic Screening and Initial Assessment:  01/05/2016 Telephonic RN CM services:  01/05/2016 Program:  Progressive Supranuclear Palsy (PSP) Insurance:  Human  Outreach call #1 to patient.  Patient not reached.  RN CM left HIPAA compliant voice message with contact information.  RN CM scheduled for next outreach within one week.   Nathaneil Canary, BSN, RN, [place] Management Care Management Coordinator [phone] Direct [phone] Cell [phone] Office [phone] Fax [email or web address]

## 2016-02-07 ENCOUNTER — Other Ambulatory Visit: Payer: Self-pay

## 2016-02-07 NOTE — Patient Outreach (Signed)
[facility] Care Management  02/07/2016  Tiffany [name] 06-06-1932 [id number]   Telephonic Monthly Assessment   Referral Date: 12/23/15  Telephonic Screening and Initial Assessment:  01/05/2016 Telephonic RN CM services:  01/05/2016 Program:  Progressive Supranuclear Palsy (PSP) Insurance:  Human  Outreach call #2 to patient.  Patient not reached.  RN CM left HIPAA compliant voice message with contact information.  RN CM scheduled for next outreach within one week.   Nathaneil Canary, BSN, RN, [place] Management Care Management Coordinator [phone] Direct [phone] Cell [phone] Office [phone] Fax [email or web address]

## 2016-02-09 ENCOUNTER — Other Ambulatory Visit: Payer: Self-pay

## 2016-02-09 NOTE — Patient Outreach (Addendum)
Tiffany [name] [facility]) Care Management  02/09/2016  Tiffany [name] 07/22/1932 [id number]   Telephonic Monthly Assessment  Program:  Other:  Progressive Supranuclear Palsy  Insurance:  Salli Quarry  Subjective:   [name] Patient states she is doing ok; no falls, occasional choking episodes but no aspiration and has family assisting with care.   Providers: Primary MD: Tiffany  Burnice [name]  -  last appt: August 2017  /  next appt: 03/05/16 Neurologist: Tiffany [name] - last appt  12/22/15 - next appt:  03/2016 HH: none Insurance:  [employer]   Social: Patient lives in her home alone.  Family providing 24/7 care with exception of 1-2 overnights alone.   [facility] also providing supportive services.   Patient remains active and attending Senior Meetings with family assistance.  Mobility:  Walker Falls: several since diagnosis of PSP (04/2015) but no falls over the past 2 months (10/2015). Pain: none  Depression: none  Transportation: family, friends Caregiver: Daughter-in-law, Tiffany [name] (COPD health issues and runs a cleaning business but provides as much care as needed).  Emergency Contact:  Tiffany [name] or  Tiffany [name]/Son [phone]. Advance Directive: yes - HCPOA - Tiffany   On file under Media dated 10/24/2015 Consent:  Yes - Patient gave verbal consent to discuss PHI with Tiffany [name] and also has HCPOA, Tiffany [name].  Resources: SS and pension; patient has no LTC insurance DME: walker,  shower chair without back,  BP cuff and scales, life alert, reading glasses, Bilateral hearing aids  Co-morbidities: HTN, Progressive Supranuclear Palsy (PSP) 04/2015. Admissions: 0 ER visits: 0 H/o patient was noted to have slurring of words, choking, falling and family thought patient was having a stroke but was diagnosed with Progressive Supranuclear Palsy (PSP) 04/2015.   States awareness that this disease process is progressive and  has affected patients vision (Can not see to the sides or up and up and down) and mobility (several falls since 04/2015 but none over the past 2 months).  Confirmed plan for LTC is to keep patient in the home and provide care.  Supportive family managing all needs at this time.  BP 150/80 12/22/2015 Weight 149 lb (68 kg) 12/22/2015    - 149 Height 64 in (163 cm) 12/22/2015 BMI 25.60 (Overweight, Pre-obese 12/22/2015  Medications:  Co-pay cost issues: none Flu Vaccine: 11/2015 Prevnar (PCV13) [facility] 11/12/2013 Pneumovax (PPSV2 02/09/2010 tDAP Vaccine N/D Medication Reconciliation completed with patient 01/05/2016   Encounter Medications:  Outpatient Encounter Prescriptions as of 02/09/2016  Medication Sig  . aspirin 81 MG tablet Take 81 mg by mouth daily.    . benazepril (LOTENSIN) 40 MG tablet TAKE 1 TABLET EVERY DAY  . Calcium Carbonate (CALTRATE 600) 1500 MG TABS Take by mouth 2 (two) times daily.    . carbidopa-levodopa (SINEMET IR) 25-100 MG tablet 2 in AM, 1 in afternoon, 1 in evening  . Cholecalciferol (VITAMIN D) 1000 UNITS capsule Take 1,000 Units by mouth daily.    . hydrochlorothiazide (HYDRODIURIL) 25 MG tablet TAKE 1 TABLET EVERY DAY  . Multiple Vitamin (MULTIVITAMIN) tablet Take 1 tablet by mouth daily.    . Omega-3 Fatty Acids (FISH OIL) 1200 MG CAPS Take 1 capsule by mouth daily.   No facility-administered encounter medications on file as of 02/09/2016.     Functional Status:  In your present state of health, do you have any difficulty performing the following activities: 01/05/2016  Hearing? Y  Vision? Tiffany [name]  Difficulty concentrating or making decisions? N  Walking or climbing stairs? Y  Dressing or bathing? Y  Doing errands, shopping? Y  Preparing Food and eating ? Y  Using the Toilet? N  In the past six months, have you accidently leaked urine? N  Do you have problems with loss of bowel control? N  Managing your Medications? N  Managing your Finances? N  Housekeeping or  managing your Housekeeping? Y  Some recent data might be hidden    Fall/Depression Screening: PHQ 2/9 Scores 01/05/2016 01/05/2016 12/03/2014 11/12/2013  PHQ - 2 Score 0 0 0 0    Last reported fall date:  10/2015 Fall Risk  02/09/2016 01/30/2016 01/05/2016 12/22/2015 09/20/2015  Falls in the past year? Yes No - No Yes  Number falls in past yr: 2 or more - - - 2 or more  Injury with Fall? No - - - Yes  Risk Factor Category  - - - - High Fall Risk  Risk for fall due to : History of fall(s);Impaired balance/gait;Impaired mobility;Impaired vision;Medication side effect - History of fall(s);Impaired balance/gait;Impaired mobility;Impaired vision;Medication side effect - Impaired balance/gait;Impaired mobility  Follow up - - - - Falls evaluation completed;Education provided;Falls prevention discussed   Preventives: Hearing: Bilateral aids - last screen - 06/2015 Eyes: Tiffany [name] see to the sides or up and down. Opthalmologist:  Tiffany [name] - last appt  12/01/15 Dentist:  Tiffany [name] - next appt 02/04/2016 Mammogram: 07/13/2015 Colonoscopy: last once completed in 1978  - No longer recommended.    Assessment: H/o Referral Date: 12/23/15  Source:  Tiffany [name] Issue:  Evaluate for patient home safety and living alone.  Patient has progressive supranuclear palsy and frequent falls.  H/o patient initial symptoms:  slurring of words, choking, falling and family thought patient was having a stroke but was diagnosed with Progressive Supranuclear Palsy (PSP) 04/2015.   Patient aware that disease process is progressive and has affected patients vision and mobility.  Patient's current LTC plan is to remain in the home.  Supportive family managing all needs at this time.   Plan:  Referral Date: 12/23/15  Telephonic Screening and Initial Assessment 01/05/16 Telephonic RN CM Date: 01/05/2016 Program:  Other:  Progressive Supranuclear Palsy   THN Telephonic RN CM Referral  -Disease Management and Education   -Fall Prevention  -Monitor Level of care needs in the home.  -Monitor for [employer] to assist with care in the home as needed.  RN CM discussed goals of RN CM services; patient aware that RN CM can assist with providing additional options for LTC if needed.   RN CM advised in next [facility] scheduled contact call within next 30 days for monthly assessment and / or  care coordination services as needed. RN CM advised to please notify MD of any changes in condition prior to scheduled appt's.   RN CM provided contact name and # [phone] or main office # [phone] and 24-hour nurse line # [phone].  RN CM confirmed patient is aware of 911 services for urgent emergency needs.   Nathaneil Canary, BSN, RN, [place] Management Care Management Coordinator [phone] Direct [phone] Cell [phone] Office [phone] Fax [email or web address]) Care Management  02/09/2016  Tiffany [name] 1933-02-17 [id number]

## 2016-03-05 ENCOUNTER — Encounter: Payer: Self-pay | Admitting: Internal Medicine

## 2016-03-05 ENCOUNTER — Ambulatory Visit (INDEPENDENT_AMBULATORY_CARE_PROVIDER_SITE_OTHER): Payer: Commercial Managed Care - HMO | Admitting: Internal Medicine

## 2016-03-05 VITALS — BP 122/74 | HR 86 | Temp 98.0°F | Ht 64.0 in | Wt 150.0 lb

## 2016-03-05 DIAGNOSIS — I10 Essential (primary) hypertension: Secondary | ICD-10-CM

## 2016-03-05 DIAGNOSIS — G231 Progressive supranuclear ophthalmoplegia [Steele-Richardson-Olszewski]: Secondary | ICD-10-CM | POA: Diagnosis not present

## 2016-03-05 DIAGNOSIS — I350 Nonrheumatic aortic (valve) stenosis: Secondary | ICD-10-CM | POA: Diagnosis not present

## 2016-03-05 NOTE — Progress Notes (Signed)
Subjective:    Patient ID: Tiffany [name], female    DOB: June 21, 1932, 82 y.o.   MRN: [id number]  HPI  82 year old patient who is seen today for her six-month follow-up. She is followed closely by neurology due to PSP. She has moderate aortic stenosis and is also followed by cardiology.  Her last 2-D echocardiogram was spring of this year.  Annual follow-up echo suggested Patient is doing well without concerns or complaints She has essential hypertension  For the past week she has had mild nonproductive cough and some postnasal drip  Past Medical History:  Diagnosis Date  . ALLERGIC RHINITIS 11/21/2006  . BACK PAIN 12/17/2006  . COLONIC POLYPS, HX OF 11/21/2006  . HYPERTENSION 11/21/2006  . OSTEOPENIA 08/11/2009  . OSTEOPOROSIS 11/21/2006  . PEDAL EDEMA 07/05/2008  . SYSTOLIC MURMUR [id number]     Social History   Social History  . Marital status: Single    Spouse name: N/A  . Number of children: N/A  . Years of education: N/A   Occupational History  . retired     Press photographer   Social History Main Topics  . Smoking status: Former Smoker    Quit date: 03/27/1995  . Smokeless tobacco: Never Used  . Alcohol use No  . Drug use: No  . Sexual activity: Not on file   Other Topics Concern  . Not on file   Social History Narrative  . No narrative on file    Past Surgical History:  Procedure Laterality Date  . BREAST SURGERY     bx  . CATARACT EXTRACTION Bilateral 2012  . HEMORRHOID SURGERY    . TONSILLECTOMY      No family history on file.  Allergies  Allergen Reactions  . Codeine Phosphate     Current Outpatient Prescriptions on File Prior to Visit  Medication Sig Dispense Refill  . aspirin 81 MG tablet Take 81 mg by mouth daily.      . benazepril (LOTENSIN) 40 MG tablet TAKE 1 TABLET EVERY DAY 90 tablet 1  . Calcium Carbonate (CALTRATE 600) 1500 MG TABS Take by mouth 2 (two) times daily.      . carbidopa-levodopa (SINEMET IR) 25-100 MG tablet 2 in AM, 1 in  afternoon, 1 in evening 360 tablet 1  . Cholecalciferol (VITAMIN D) 1000 UNITS capsule Take 1,000 Units by mouth daily.      . hydrochlorothiazide (HYDRODIURIL) 25 MG tablet TAKE 1 TABLET EVERY DAY 90 tablet 1  . Multiple Vitamin (MULTIVITAMIN) tablet Take 1 tablet by mouth daily.      . Omega-3 Fatty Acids (FISH OIL) 1200 MG CAPS Take 1 capsule by mouth daily.     No current facility-administered medications on file prior to visit.     BP 122/74 (BP Location: Left Arm, Patient Position: Sitting, Cuff Size: Normal)   Pulse 86   Temp 98 F (36.7 C) (Oral)   Ht 5\' 4"  (1.626 m)   Wt 150 lb (68 kg)   SpO2 97%   BMI 25.75 kg/m     Review of Systems  Constitutional: Negative.   HENT: Positive for postnasal drip. Negative for congestion, dental problem, hearing loss, rhinorrhea, sinus pressure, sore throat and tinnitus.   Eyes: Negative for pain, discharge and visual disturbance.  Respiratory: Positive for cough. Negative for shortness of breath.   Cardiovascular: Negative for chest pain, palpitations and leg swelling.  Gastrointestinal: Negative for abdominal distention, abdominal pain, blood in stool, constipation, diarrhea, nausea and vomiting.  Genitourinary: Negative for difficulty urinating, dysuria, flank pain, frequency, hematuria, pelvic pain, urgency, vaginal bleeding, vaginal discharge and vaginal pain.  Musculoskeletal: Positive for gait problem. Negative for arthralgias and joint swelling.  Skin: Negative for rash.  Neurological: Positive for speech difficulty. Negative for dizziness, syncope, weakness, numbness and headaches.  Hematological: Negative for adenopathy.  Psychiatric/Behavioral: Positive for confusion. Negative for agitation, behavioral problems and dysphoric mood. The patient is not nervous/anxious.        Objective:   Physical Exam  Constitutional: She is oriented to person, place, and time. She appears well-developed and well-nourished.  HENT:  Head:  Normocephalic.  Right Ear: External ear normal.  Left Ear: External ear normal.  Mouth/Throat: Oropharynx is clear and moist.  Eyes: Conjunctivae and EOM are normal. Pupils are equal, round, and reactive to light.  Neck: Normal range of motion. Neck supple. No thyromegaly present.  Cardiovascular: Normal rate, regular rhythm, normal heart sounds and intact distal pulses.   Pulmonary/Chest: Effort normal and breath sounds normal.  Abdominal: Soft. Bowel sounds are normal. She exhibits no mass. There is no tenderness.  Musculoskeletal: Normal range of motion.  Lymphadenopathy:    She has no cervical adenopathy.  Neurological: She is alert and oriented to person, place, and time.  Skin: Skin is warm and dry. No rash noted.  Psychiatric: She has a normal mood and affect. Her behavior is normal.          Assessment & Plan:   Moderate aortic stenosis.  Follow-up 2-D echocardiogram 4-6 months PSP.  Follow-up neurology Essential hypertension, stable Mild URI with cough.  Treat symptomatically  Nyoka Cowden

## 2016-03-05 NOTE — Progress Notes (Signed)
Pre visit review using our clinic review tool, if applicable. No additional management support is needed unless otherwise documented below in the visit note. 

## 2016-03-05 NOTE — Patient Instructions (Signed)
Limit your sodium (Salt) intake  Return in 6 months for follow-up  

## 2016-03-08 ENCOUNTER — Telehealth: Payer: Self-pay | Admitting: Neurology

## 2016-03-08 ENCOUNTER — Other Ambulatory Visit: Payer: Self-pay

## 2016-03-08 DIAGNOSIS — G231 Progressive supranuclear ophthalmoplegia [Steele-Richardson-Olszewski]: Secondary | ICD-10-CM

## 2016-03-08 NOTE — Telephone Encounter (Signed)
Referral entered  

## 2016-03-08 NOTE — Telephone Encounter (Signed)
-----   Message from Frazier Butt, PT sent at 03/08/2016 11:26 AM EST ----- Dr. Carles Collet, Mrs. Pilson is scheduled for return PT, OT, speech evals on 03/27/16 as follow up evals from previous therapies.  Patient agreed upon this at discharge.  If you agree, please send PT, OT, speech orders via EPIC.  Thank you so much.  Sincerely, Mady Haagensen, PT

## 2016-03-08 NOTE — Patient Outreach (Signed)
[facility]) Care Management  03/08/2016  MORGANDY BUSSIE 1932-04-06 [id number]   Telephonic Monthly Assessment  Program:  Other:  Progressive Supranuclear Palsy  Insurance:  Salli Quarry  Subjective:   [place] [address] Patient states again this month that she is doing ok; no falls, occasional choking episodes but no aspiration and has family assisting with care.   Providers: Primary MD: Dr.Peter  Burnice Logan  -  last appt:  03/05/16 Neurologist: Dr. Eustace Quail.Tat - last appt  12/22/15 - next appt:  03/2016 HH: none Insurance:  [employer]   Social: Patient lives in her home alone.  Family providing 24/7 care with exception of 1-2 overnights alone.   [facility] also providing supportive services.   Patient remains active and attending Senior Meetings with family assistance.  Mobility:  Walker Falls: several since diagnosis of PSP (04/2015) but no falls over the past 2 months (10/2015). Pain: none  Depression: none  Transportation: family, friends Caregiver: Daughter-in-law, Astraea Galella (COPD health issues and runs a cleaning business but provides as much care as needed).  Emergency Contact:  Felipa Eth or  Richard Hegarty/Son [phone]. Advance Directive: yes - HCPOA - Richard   On file under Media dated 10/24/2015 Consent:  Yes - Patient gave verbal consent to discuss PHI with Felipa Eth and also has HCPOA, Eliseo Gum.  Resources: SS and pension; patient has no LTC insurance DME: walker,  shower chair without back,  BP cuff and scales, life alert, reading glasses, Bilateral hearing aids  Co-morbidities: HTN, Progressive Supranuclear Palsy (PSP) 04/2015. Admissions: 0 ER visits: 0 H/o patient was noted to have slurring of words, choking, falling and family thought patient was having a stroke but was diagnosed with Progressive Supranuclear Palsy (PSP) 04/2015.   States awareness that this disease process is progressive and has  affected patients vision (Can not see to the sides or up and up and down) and mobility (several falls since 04/2015 but none over the past 2 months).  Confirmed plan for LTC is to keep patient in the home and provide care.  Supportive family managing all needs at this time.   Medications:  Co-pay cost issues: none Flu Vaccine: 11/2015 Prevnar (PCV13) [facility] 11/12/2013 Pneumovax (PPSV2 02/09/2010   Encounter Medications:  Outpatient Encounter Prescriptions as of 03/08/2016  Medication Sig  . aspirin 81 MG tablet Take 81 mg by mouth daily.    . benazepril (LOTENSIN) 40 MG tablet TAKE 1 TABLET EVERY DAY  . Calcium Carbonate (CALTRATE 600) 1500 MG TABS Take by mouth 2 (two) times daily.    . carbidopa-levodopa (SINEMET IR) 25-100 MG tablet 2 in AM, 1 in afternoon, 1 in evening  . Cholecalciferol (VITAMIN D) 1000 UNITS capsule Take 1,000 Units by mouth daily.    . hydrochlorothiazide (HYDRODIURIL) 25 MG tablet TAKE 1 TABLET EVERY DAY  . Multiple Vitamin (MULTIVITAMIN) tablet Take 1 tablet by mouth daily.    . Omega-3 Fatty Acids (FISH OIL) 1200 MG CAPS Take 1 capsule by mouth daily.   No facility-administered encounter medications on file as of 03/08/2016.     Functional Status:  In your present state of health, do you have any difficulty performing the following activities: 01/05/2016  Hearing? Y  Vision? Y  Difficulty concentrating or making decisions? N  Walking or climbing stairs? Y  Dressing or bathing? Y  Doing errands, shopping? Y  Preparing Food and eating ? Y  Using the Toilet? N  In the past six months, have you accidently leaked urine? N  Do you have problems with loss of bowel control? N  Managing your Medications? N  Managing your Finances? N  Housekeeping or managing your Housekeeping? Y  Some recent data might be hidden    Fall Risk  03/08/2016 03/08/2016 02/09/2016 01/30/2016 01/05/2016  Falls in the past year? Yes No Yes No -  Number falls in past yr: 2 or more - 2 or  more - -  Injury with Fall? No - No - -  Risk Factor Category  High Fall Risk - - - -  Risk for fall due to : History of fall(s);Impaired balance/gait;Impaired mobility - History of fall(s);Impaired balance/gait;Impaired mobility;Impaired vision;Medication side effect - History of fall(s);Impaired balance/gait;Impaired mobility;Impaired vision;Medication side effect  Follow up Falls evaluation completed;Education provided;Falls prevention discussed - - - -   Prventives: Hearing: Bilateral aids - last screen - 06/2015 Eyes: Dr. Sharyl Nimrod see to the sides or up and down. Opthalmologist:  Dr. Pandora Leiter - last appt  12/01/15 Dentist:  Dr. Conley Canal - next appt 02/04/2016 Mammogram: 07/13/2015 Colonoscopy: last once completed in 1978  - No longer recommended.    Assessment: H/o Referral Date: 12/23/15  Source:  [facility] Issue:  Evaluate for patient home safety and living alone.  Patient has progressive supranuclear palsy and frequent falls.  H/o patient initial symptoms:  slurring of words, choking, falling and family thought patient was having a stroke but was diagnosed with Progressive Supranuclear Palsy (PSP) 04/2015.   Patient aware that disease process is progressive and has affected patients vision and mobility.  Patient's current LTC plan is to remain in the home.  Supportive family managing all needs at this time.   Patient elects to remain in her home at this time.  Patient verbalizes understanding to contact her MD should she need further assistance for care coordination of services and MD can make new referral to [facility].   Plan:  Referral Date: 12/23/15  Telephonic Screening and Initial Assessment 01/05/16 Telephonic RN CM Date: 01/05/2016 - 03/08/2016 Program:  Other:  Progressive Supranuclear Palsy   [facility]N notified case closed Case closure letter to MD and patient.   RN CM advised to please notify MD of any changes in condition prior to scheduled appt's.   RN CM provided contact name  and # [phone] or main office # [phone] and 24-hour nurse line # [phone].  RN CM confirmed patient is aware of 911 services for urgent emergency needs.  Nathaneil Canary, BSN, RN, [place] Care Management Care Management Coordinator [phone] Direct [phone] Cell [phone] Office [phone] Fax [email or web address]

## 2016-03-27 ENCOUNTER — Telehealth: Payer: Self-pay | Admitting: Neurology

## 2016-03-27 ENCOUNTER — Ambulatory Visit: Payer: Medicare HMO | Admitting: Physical Therapy

## 2016-03-27 ENCOUNTER — Ambulatory Visit: Payer: Medicare HMO | Attending: Internal Medicine | Admitting: Occupational Therapy

## 2016-03-27 ENCOUNTER — Ambulatory Visit: Payer: Medicare HMO

## 2016-03-27 DIAGNOSIS — R2681 Unsteadiness on feet: Secondary | ICD-10-CM | POA: Insufficient documentation

## 2016-03-27 DIAGNOSIS — R1313 Dysphagia, pharyngeal phase: Secondary | ICD-10-CM | POA: Insufficient documentation

## 2016-03-27 DIAGNOSIS — R41842 Visuospatial deficit: Secondary | ICD-10-CM

## 2016-03-27 DIAGNOSIS — R41844 Frontal lobe and executive function deficit: Secondary | ICD-10-CM | POA: Insufficient documentation

## 2016-03-27 DIAGNOSIS — R278 Other lack of coordination: Secondary | ICD-10-CM | POA: Diagnosis not present

## 2016-03-27 DIAGNOSIS — R471 Dysarthria and anarthria: Secondary | ICD-10-CM | POA: Insufficient documentation

## 2016-03-27 DIAGNOSIS — R2689 Other abnormalities of gait and mobility: Secondary | ICD-10-CM

## 2016-03-27 DIAGNOSIS — G231 Progressive supranuclear ophthalmoplegia [Steele-Richardson-Olszewski]: Secondary | ICD-10-CM

## 2016-03-27 DIAGNOSIS — R29818 Other symptoms and signs involving the nervous system: Secondary | ICD-10-CM | POA: Insufficient documentation

## 2016-03-27 DIAGNOSIS — R293 Abnormal posture: Secondary | ICD-10-CM | POA: Insufficient documentation

## 2016-03-27 NOTE — Therapy (Signed)
[facility] [address] [place], [name], [address] Phone: [phone]   Fax:  [phone]  Physical Therapy Evaluation  Patient Details  Name: Tiffany [name] MRN: [id number] Date of Birth: [birth year] Referring Provider: Alonza Bogus, DO  Encounter Date: 03/27/2016      PT End of Session - 03/27/16 1254    Visit Number 1   Number of Visits 1   Authorization Type [employer] HMO Medicare   PT Start Time [id number]   PT Stop Time 1153   PT Time Calculation (min) 48 min   Equipment Utilized During Treatment Gait belt   Activity Tolerance Patient tolerated treatment well   Behavior During Therapy Impulsive      Past Medical History:  Diagnosis Date  . ALLERGIC RHINITIS 11/21/2006  . BACK PAIN 12/17/2006  . COLONIC POLYPS, HX OF 11/21/2006  . HYPERTENSION 11/21/2006  . OSTEOPENIA 08/11/2009  . OSTEOPOROSIS 11/21/2006  . PEDAL EDEMA 07/05/2008  . SYSTOLIC MURMUR [id number]    Past Surgical History:  Procedure Laterality Date  . BREAST SURGERY     bx  . CATARACT EXTRACTION Bilateral 2012  . HEMORRHOID SURGERY    . TONSILLECTOMY      There were no vitals filed for this visit.       Subjective Assessment - 03/27/16 1109    Subjective Pt reports mobility is about the same.  No falls since previous bout of therapy.  Pt uses rollator when outside, and inside the house, I hold onto the walls, but not often.  Pt does not use device in the house.   Pertinent History Likes to be called "Tiffany [name]".  PMH significant for: progressive supranuclear palsy (diagnosed 07/15/15), HTN, osteopenia, aortic stenosis, bilateral cataract extraction   Patient Stated Goals "No goal-I've learned to live with it."  Pt agrees to work on balance.   Currently in Pain? No/denies            [facility] PT Assessment - 03/27/16 1113      Assessment   Medical Diagnosis Progressive supranuclear palsy   Referring Provider Wells Guiles Tat, DO   Onset  Date/Surgical Date 12/22/15  last visit to Dr. Carles Collet     Precautions   Precautions Fall   Precaution Comments Impaired visual tracking, impaired downward gaze     Balance Screen   Has the patient fallen in the past 6 months No   How many times? 0   Has the patient had a decrease in activity level because of a fear of falling?  Yes   Is the patient reluctant to leave their home because of a fear of falling?  No     Home Social worker Private residence   Living Arrangements Alone   Available Help at Discharge --  Hired driver   Type of [place] to enter   Entrance Stairs-Number of Steps 1   [place] One level   [place] - 4 wheels     Prior Function   Level of Independence Independent with basic ADLs;Independent with community mobility with device   Vocation Retired   Leisure Enjoys watching TV, going to church; not currently exercising regularly     Cognition   Behaviors Impulsive  starts movement patterns before directions finished     Tone   Assessment Location Right Lower Extremity;Left Lower Extremity     ROM / Strength   AROM / PROM /  Strength PROM     PROM   Overall PROM  Within functional limits for tasks performed     Strength   Overall Strength Within functional limits for tasks performed     Transfers   Transfers Sit to Stand;Stand to Sit   Sit to Stand 5: Supervision;4: Min guard   Five time sit to stand comments  12.55  several episodes of posterior pull upon standing   Stand to Sit 5: Supervision;4: Min guard   Comments Pt reports difficulty getting up from lower surfaces     Ambulation/Gait   Ambulation/Gait Yes   Ambulation/Gait Assistance 6: Modified independent (Device/Increase time);5: Supervision   Ambulation Distance (Feet) 200 Feet   Assistive device Rollator;None   Gait Pattern Step-through pattern;Decreased stride length;Poor foot clearance -  right;Decreased arm swing - right;Decreased arm swing - left;Wide base of support;Decreased trunk rotation;Poor foot clearance - left   Ambulation Surface Level;Indoor   Gait velocity 10.29 sec = 3.19 ft/sec     Standardized Balance Assessment   Standardized Balance Assessment Timed Up and Go Test     Timed Up and Go Test   TUG Normal TUG;Manual TUG;Cognitive TUG   Normal TUG (seconds) 20.32  with rollator; 16.52 sec no device   Manual TUG (seconds) 18.13  no device   Cognitive TUG (seconds) 18.52  no device   TUG Comments Scores >13.5-15 seconds indicates increased fall risk.  >10% difference in TUG and cog/manual TUG indicates difficulty with dual tasking.     High Level Balance   High Level Balance Comments Retropulsion/pull test:  pt would fall if unaided-has no step reaction in posterior direction.       Functional Gait  Assessment   Gait assessed  Yes   Gait Level Surface Walks 20 ft, slow speed, abnormal gait pattern, evidence for imbalance or deviates 10-15 in outside of the 12 in walkway width. Requires more than 7 sec to ambulate 20 ft.  7.76   Change in Gait Speed Makes only minor adjustments to walking speed, or accomplishes a change in speed with significant gait deviations, deviates 10-15 in outside the 12 in walkway width, or changes speed but loses balance but is able to recover and continue walking.   Gait with Horizontal Head Turns Performs head turns with moderate changes in gait velocity, slows down, deviates 10-15 in outside 12 in walkway width but recovers, can continue to walk.   Gait with Vertical Head Turns Performs task with slight change in gait velocity (eg, minor disruption to smooth gait path), deviates 6 - 10 in outside 12 in walkway width or uses assistive device   Gait and Pivot Turn Pivot turns safely in greater than 3 sec and stops with no loss of balance, or pivot turns safely within 3 sec and stops with mild imbalance, requires small steps to catch  balance.  3.02   Step Over Obstacle Is able to step over one shoe box (4.5 in total height) but must slow down and adjust steps to clear box safely. May require verbal cueing.   Gait with Narrow Base of Support Ambulates less than 4 steps heel to toe or cannot perform without assistance.   Gait with Eyes Closed Walks 20 ft, uses assistive device, slower speed, mild gait deviations, deviates 6-10 in outside 12 in walkway width. Ambulates 20 ft in less than 9 sec but greater than 7 sec.  8.25   Ambulating Backwards Walks 20 ft, uses assistive device, slower speed, mild gait  deviations, deviates 6-10 in outside 12 in walkway width.  12.79   Steps Alternating feet, must use rail.   Total Score 14   FGA comment: Pt scored 21/30 at last bout of therapy.  Scores <22/30 indicate increased fall risk.Marland Kitchen     RLE Tone   RLE Tone Within Functional Limits     LLE Tone   LLE Tone Within Functional Limits                                    Plan - 2016/04/03 1254    Clinical Impression Statement Pt is an 83 year old female who presents to OP PT with diagnosis of Progressive supranuclear palsy, with history of falls (no falls reported in the past 6 months).  Pt demonstrates decreased safety awareness, impulsivity with movements, decreased balance, decreased gait independence, decreased transfer efficiency and safety.  Pt would benefit from skilled therapy services; however, at end of PT evaluation, pt requests home health therapy services.  PT discussed with OT and speech therapist and they are in agreement for home health services due to potential for increased safety and carryover in the home, and will follow up with physician to request orders.   PT Frequency --  Would recommend 2x/wk for 8 weeks, but pt requests home health   PT Next Visit Plan Eval only; will request home health PT, OT, speech evaluations from physician.   Consulted and Agree with Plan of Care Patient       Patient will benefit from skilled therapeutic intervention in order to improve the following deficits and impairments:  Abnormal gait, Decreased balance, Decreased mobility, Decreased safety awareness, Difficulty walking  Visit Diagnosis: Other abnormalities of gait and mobility  Unsteadiness on feet      G-Codes - 03-Apr-2016 1301    Functional Assessment Tool Used Functional Gait Assessment 14/30, TUG >16 seconds, TUG cog and TUG man >18 seconds; gait velocity 3.18 ft/sec with rollator   Functional Limitation Mobility: Walking and moving around   Mobility: Walking and Moving Around Current Status [id number]) At least 40 percent but less than 60 percent impaired, limited or restricted   Mobility: Walking and Moving Around Goal Status [phone]) At least 40 percent but less than 60 percent impaired, limited or restricted   Mobility: Walking and Moving Around Discharge Status [id number]) At least 40 percent but less than 60 percent impaired, limited or restricted       Problem List Patient Active Problem List   Diagnosis Date Noted  . PSP (progressive supranuclear palsy) ([place]) 07/15/2015  . Aortic stenosis 06/09/2015  . Dysphasia 06/09/2015  . OSTEOPENIA 08/11/2009  . PEDAL EDEMA 07/05/2008  . BACK PAIN 12/17/2006  . Essential hypertension 11/21/2006  . Allergic rhinitis 11/21/2006  . Osteoporosis 11/21/2006  . History of colonic polyps 11/21/2006    Stephen Turnbaugh W. 2016/04/03, 1:03 PM Frazier Butt., PT  [facility] [address] [place], [name], [address] Phone: [phone]   Fax:  [phone]  Name: SHARLITA FELTER MRN: [id number] Date of Birth: [birth year]

## 2016-03-27 NOTE — Therapy (Signed)
Tiffany [name] [address] [place], [name], [address] Phone: [phone]   Fax:  [phone]  Occupational Therapy Evaluation  Patient Details  Name: Tiffany [name] MRN: [id number] Date of Birth: 27-Oct-1932 Referring Provider: Dr. Wells Guiles Tat  Encounter Date: 03/27/2016      OT End of Session - 03/27/16 1707    Visit Number 1   Number of Visits 1   Date for OT Re-Evaluation 05/24/16   Authorization Type [employer] Medicare   Authorization - Visit Number 1   Authorization - Number of Visits 10   OT Start Time 1021   OT Stop Time 1100   OT Time Calculation (min) 39 min   Activity Tolerance Patient tolerated treatment well   Behavior During Therapy [facility] for tasks assessed/performed      Past Medical History:  Diagnosis Date  . ALLERGIC RHINITIS 11/21/2006  . BACK PAIN 12/17/2006  . COLONIC POLYPS, HX OF 11/21/2006  . HYPERTENSION 11/21/2006  . OSTEOPENIA 08/11/2009  . OSTEOPOROSIS 11/21/2006  . PEDAL EDEMA 07/05/2008  . SYSTOLIC MURMUR [id number]    Past Surgical History:  Procedure Laterality Date  . BREAST SURGERY     bx  . CATARACT EXTRACTION Bilateral 2012  . HEMORRHOID SURGERY    . TONSILLECTOMY      There were no vitals filed for this visit.      Subjective Assessment - 03/27/16 1026    Subjective  No falls since in last 6 months   Pertinent History see Epic   Patient Stated Goals improve ADLs   Currently in Pain? No/denies   Pain Onset [name] OT Assessment - 03/27/16 0001      Assessment   Diagnosis PSP   Referring Provider Dr. Wells Guiles Tat   Onset Date 07/15/15   Prior Therapy OT, PT, ST last d/c 6/17     Precautions   Precautions Fall   Precaution Comments impaired vision     Balance Screen   Has the patient fallen in the past 6 months No     Home  Environment   Family/patient expects to be discharged to: Private residence   Lives With Alone     Prior Function    Level of Independence Independent with basic ADLs;Independent with community mobility with device   Vocation Retired   Leisure enjoys watching tv, going to church.  Not currently exercising regularly     ADL   Eating/Feeding Modified independent   Grooming --  difficulty styling back of hair, no longer wears make-up   Grooming Patient Percentage --  min difficulty manipulating toothbrush   Upper Body Bathing --  difficulty reaching back   Lower Body Bathing --  difficulty    Upper Body Dressing Increased time   Lower Body Dressing Increased time   Toilet Tranfer Modified independent   Toileting -  Hygiene --  difficulty   Tub/Shower Transfer Modified independent   Tub/Shower Transfer Equipment Grab bars  shower stall with seat     IADL   Prior Level of Function Shopping has aide who does shopping from pt's list   Prior Level of Function Light Housekeeping pt has someone to help x1  does light tasks mod I   Prior Level of Function Meal Prep no longer cook   Meal Prep --  microwave only   Nurse, children's --  hired someone for transportation   Medication Management Is responsible for taking  medication in correct dosages at correct time     Mobility   Mobility Status History of falls   Mobility Status Comments Ambulates without device in the home and with 4 wheeled rolling walker in the community     Written Expression   Dominant Hand Right   Handwriting Increased time;75% legible  mod micrographia   Effective Techniques --  didn't do christmas cards this year due to writing difficult     Vision - History   Baseline Vision Wears glasses only for reading   Additional Comments denies diplopia or difficulty     Vision Assessment   Tracking/Visual Pursuits --  unable to track inferiorly and decr superior tracking   Diplopia Assessment --  denies   Comment decr awareness of functional impact of visual changes     Cognition   Area of Impairment  Safety/judgement;Awareness   Safety/Judgement Decreased awareness of safety;Decreased awareness of deficits   Safety and Judgement Comments impulsive, decr safety using RW during transfers   Awareness Intellectual   Behaviors Impulsive     Observation/Other Assessments   Observations rounded shoulders, forward lean   Standing Functional Reach Test RUE 6 ", LUE 7 "   Other Surveys  Select   Physical Performance Test   Yes   Simulated Eating Time (seconds) 18.15sec   Donning Doffing Jacket Time (seconds) 20.97   Donning Doffing Jacket Comments Fastening/unfastening 3 buttons on table in 30.15sec     Coordination   Right 9 Hole Peg Test 42.82   Left 9 Hole Peg Test 33.91   Box and Blocks R 38 blocks, L 43 blocks     Tone   Assessment Location Right Upper Extremity;Left Upper Extremity     ROM / Strength   AROM / PROM / Strength AROM     AROM   Overall AROM  Within functional limits for tasks performed  for BUEs, except mild decr R elbow ext     PROM   Overall PROM  Within functional limits for tasks performed     RUE Tone   RUE Tone Within Functional Limits  difficult to assess due to difficulty following directions     LUE Tone   LUE Tone Within Functional Limits  difficult to assess due to difficulty following directions                         OT Education - 03/27/16 1706    Education Details OT evaluation results   Person(s) Educated Patient   Methods Explanation   Comprehension Verbalized understanding                Plan - 03/27/16 1708    Clinical Impression Statement Pt is an 83 y.o. female with PSP.  Pt with PMH that includes:  hx of falls, osteopenia, dysphasia, aortic stenosis, back pain, HTN.  Pt presents today with decr balance for ADLs, decr safety/impulsivity, decr coordination, hypokinesia, timing deficits, decr posture, and cognitive deficits.  Pt would benefit from occupational therapy to address these deficits; however, pt  may be better served by home health therapies to address safety within the home   Rehab Potential Fair   Clinical Impairments Affecting Rehab Potential timing deficits with movement, difficulty sequencing movement, impulsivity, lives alone   OT Frequency --  eval only at this time as home health therapy recommended   OT Treatment/Interventions Self-care/ADL training   Plan Recommend home health occupational therapy (per pt request and to  better address safety within the home and allow for improved carryover).  If pt unable to qualify for home health therapies, note will be addended with goals added as pt would benefit from skilled OT services.   Consulted and Agree with Plan of Care Patient      Patient will benefit from skilled therapeutic intervention in order to improve the following deficits and impairments:  Decreased coordination, Decreased range of motion, Decreased safety awareness, Decreased endurance, Decreased activity tolerance, Impaired tone, Impaired UE functional use, Pain, Decreased knowledge of use of DME, Decreased balance, Decreased cognition, Decreased mobility, Decreased strength, Impaired vision/preception, Improper spinal/pelvic alignment, Difficulty walking, Abnormal gait  Visit Diagnosis: Other symptoms and signs involving the nervous system  Other lack of coordination  Other abnormalities of gait and mobility  Abnormal posture  Frontal lobe and executive function deficit  Visuospatial deficit      G-Codes - 04/11/2016 1725    Functional Assessment Tool Used impulsivity, decr safety, needs A for IADLs, clinical judgement   Functional Limitation Self care   Self Care Current Status [id number]) At least 40 percent but less than 60 percent impaired, limited or restricted   Self Care Goal Status [id number]) At least 40 percent but less than 60 percent impaired, limited or restricted   Self Care Discharge Status [phone]) At least 40 percent but less than 60 percent impaired,  limited or restricted      Problem List Patient Active Problem List   Diagnosis Date Noted  . PSP (progressive supranuclear palsy) ([place]) 07/15/2015  . Aortic stenosis 06/09/2015  . Dysphasia 06/09/2015  . OSTEOPENIA 08/11/2009  . PEDAL EDEMA 07/05/2008  . BACK PAIN 12/17/2006  . Essential hypertension 11/21/2006  . Allergic rhinitis 11/21/2006  . Osteoporosis 11/21/2006  . History of colonic polyps 11/21/2006    [facility] April 11, 2016, 5:27 PM  [facility] [address] [place], [name], [address] Phone: [phone]   Fax:  [phone]  Name: Tiffany [name] MRN: [id number] Date of Birth: [birth year]   Vianne Bulls, OTR/L [facility] [address] [place], [place]  [address] [phone] phone [phone] 11-Apr-2016 5:27 PM

## 2016-03-27 NOTE — Therapy (Signed)
[facility] [address] [place], [name], [address] Phone: [phone]   Fax:  [phone]  Speech Language Pathology Evaluation  Patient Details  Name: Tiffany [name] MRN: [id number] Date of Birth: [birth year] Referring Provider: Alonza Bogus  Encounter Date: 03/27/2016      End of Session - 03/27/16 1332    Visit Number 1   Number of Visits 1   Date for SLP Re-Evaluation 03/27/16  due to recommending home health   SLP Start Time 1150   SLP Stop Time  1230   SLP Time Calculation (min) 40 min   Activity Tolerance Patient tolerated treatment well      Past Medical History:  Diagnosis Date  . ALLERGIC RHINITIS 11/21/2006  . BACK PAIN 12/17/2006  . COLONIC POLYPS, HX OF 11/21/2006  . HYPERTENSION 11/21/2006  . OSTEOPENIA 08/11/2009  . OSTEOPOROSIS 11/21/2006  . PEDAL EDEMA 07/05/2008  . SYSTOLIC MURMUR [id number]    Past Surgical History:  Procedure Laterality Date  . BREAST SURGERY     bx  . CATARACT EXTRACTION Bilateral 2012  . HEMORRHOID SURGERY    . TONSILLECTOMY      There were no vitals filed for this visit.      Subjective Assessment - 03/27/16 1249    Subjective Pt put chin down spontaneously with sip thin without SLP reminding her. Pt reports getting choked with liquids, more than a couple times a day.            SLP Evaluation [facility] - 03/27/16 1200      SLP Visit Information   SLP Received On 03/27/16   Referring Provider Tat, Wells Guiles   Onset Date early 2017   Medical Diagnosis PSP     Subjective   Subjective Pt incorrectly recalled effortful swallow (without effort), but recalled chin/fist resistance as well as Masako and tongue base exercise (gargle). She has reportedly kept up with those exercises approx once-twice a week. Pt was told during her last session in summer 2017 to maintain all of her swallowing HEP until her re-evaluation today.     Pain Assessment   Currently in Pain?  No/denies     General Information   HPI Pt was present at this facility for a course of 15 ST sessions focusing on swallowing and speech intelligibility in spring 2017 to June 2017. Pt made most progress with compensations for dysarthria. Carryover of swallow precautions from [facility] in April 2017 and performance with swallowing HEP were difficult primarily due to pt's memory status. Pt reports she has remained free of PNA since last therapy course.     Prior Functional Status   Cognitive/Linguistic Baseline Baseline deficits   Baseline deficit details memory   Type of Home House    Lives With Alone   Available Support --  In June son said plan was to begin 24 hr care -not yet done   Vocation Retired     Associate Professor   Overall Cognitive Status History of cognitive impairments - at baseline   Area of Impairment Memory   Memory Decreased recall of precautions;Decreased short-term memory  Pt did not put chin down with a sip later in eval- coughed     Oral Motor/Sensory Function   Overall Oral Motor/Sensory Function Impaired   Labial ROM Within Functional Limits   Labial Symmetry Within Functional Limits   Labial Strength Reduced   Labial Coordination Reduced   Lingual ROM Reduced right;Reduced left   Lingual Symmetry Within Functional Limits  Lingual Strength Reduced Right   Lingual Coordination Reduced   Facial ROM Reduced right;Reduced left   Facial Symmetry Within Functional Limits   Velum Impaired left  less vealr rise on lt than rt     Motor Speech   Overall Motor Speech Impaired   Respiration Impaired   Level of Impairment Phrase   Phonation Low vocal intensity   Resonance Hypernasality  incr'd as utterance length incr'd   Articulation Impaired   Level of Impairment Phrase   Intelligibility Intelligibility reduced   Word --  100%   Phrase --  90%   Sentence --  85-90%   Conversation --  90%   Effective Techniques Over-articulate;Increased vocal intensity                          SLP Education - 04-18-2016 1230    Education provided Yes   Education Details Compensations for dysarthria ("Talk big and talk loud"), Chin down with liquids   Person(s) Educated Patient   Methods Explanation;Demonstration;Verbal cues   Comprehension Verbalized understanding;Verbal cues required;Need further instruction          Plan - Apr 18, 2016 1333    Clinical Impression Statement Pt presented with s/s dysarthria caused by PSP, as well as reports of dysphagia routinely during the day. Pt put chin down spontaneously once today without s/s aspiration, however did NOT put her chin down with a second sip of water, with an ensuing subtle cough. Pt had challenges with carryover of dysarthria strategies, dysphagia HEP, and dysphagia precautions due to decr'd memory last therapy course. Pt asked if home health therapy was an option for her. Due to pt safety and difficulty with carryover once again exhibited in eval session today (inconsistent carryover of chin tuck and of HEP for dysphagia), SLP recommends home health ST if this is an option. The patient will also be able to engage in therapy in her home environment. If pt does not qualify or referring MD does not agree with home health recommendation report will be amended and therapy goals can be added at a later date.   Treatment/Interventions Aspiration precaution training;Pharyngeal strengthening exercises;Diet toleration management by SLP;SLP instruction and feedback;Compensatory strategies;Internal/external aids;Patient/family education;Functional tasks;Cueing hierarchy   Potential to Achieve Goals Fair   Potential Considerations Severity of impairments;Ability to learn/carryover information   Consulted and Agree with Plan of Care Patient      Patient will benefit from skilled therapeutic intervention in order to improve the following deficits and impairments:   Dysarthria and anarthria  Pharyngeal  dysphagia      G-Codes - 04-18-16 1340    Functional Assessment Tool Used NOMS-5 (approx 30% impaired)   Functional Limitations Motor speech   Motor Speech Current Status [phone]) At least 20 percent but less than 40 percent impaired, limited or restricted   Motor Speech Goal Status [id number]) At least 1 percent but less than 20 percent impaired, limited or restricted      Problem List Patient Active Problem List   Diagnosis Date Noted  . PSP (progressive supranuclear palsy) ([place]) 07/15/2015  . Aortic stenosis 06/09/2015  . Dysphasia 06/09/2015  . OSTEOPENIA 08/11/2009  . PEDAL EDEMA 07/05/2008  . BACK PAIN 12/17/2006  . Essential hypertension 11/21/2006  . Allergic rhinitis 11/21/2006  . Osteoporosis 11/21/2006  . History of colonic polyps 11/21/2006    [facility] ,MS, CCC-SLP  04/18/16, 1:42 PM  [facility] [address] [place], [name],  [id number] Phone: [phone]   Fax:  [phone]  Name: Tiffany [name] MRN: [id number] Date of Birth: 1932/10/02

## 2016-03-27 NOTE — Telephone Encounter (Signed)
Home health order entered per Dr. Carles Collet.

## 2016-03-28 NOTE — Progress Notes (Signed)
Tiffany [name] was seen today in the movement disorders clinic for neurologic consultation at the request of Nyoka Cowden, MD.  The patient is seen today in neurologic consultation for the subacute evaluation of gait and speech changes for the last several months.  Looking back, she thinks that her handwriting began to change after Thanksgiving some time.  She remembers it being difficult to write her cards at thanksgiving, as the handwriting was smaller and less fluid.  She thinks that she began to have difficulty with word finding about 2 months ago.  She presented to the emergency room at [facility] in [place] on February 27 and I reviewed those records.  She had an MRI of the brain there and I do not have the films, but I do have the report.  It was nonacute and only revealed mild white matter changes.  She was told to follow up with ENT.  She did not have an ENT consult but she did follow up with her primary care physician on 06/03/2015 and was set up with a speech therapy evaluation, cardiology follow-up and was referred here.  She had a carotid ultrasound on 06/06/2015 which just demonstrated 1-39% stenosis.  I reviewed her cardiology follow-up from 06/09/2015 and they did not think that her new symptoms were related to her aortic stenosis.  Her repeat echocardiogram is pending.  I did get a note from her speech therapist and reviewed their records as well.  It was felt that she had both dysphasia as well as dysphagia and a modified barium swallow as well as traditional speech, occupational and physical therapy are being recommended.  Denies muscles jumping.  Is having trouble swallowing.  Only new medication is Lipitor and she started that a few weeks ago.    07/15/15 update:  The patient follows up today after having multiple tests performed.  She had an MRI of the cervical spine on April, 2017 that demonstrated degenerative changes and central canal stenosis and marked bilateral neural  foraminal stenosis at the C4-C5 level and C5-C6 level and significant left neural foraminal stenosis at the C3-C4 level.  She had an EMG performed that was normal, without evidence of motor neuron disease.  She had a modified barium swallow done on 06/29/2015.  There was moderate pharyngeal phase dysphagia.  Regular diet with thin liquids was recommended.  A chin tuck maneuver was helpful.  Home meds with pure was also recommended.  She had a dat scan done on 07/05/2015 with abnormal significant bilateral reduction and putamen uptake with activity largely confined to the caudate nuclei bilaterally.  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  She has been in physical therapy, speech therapy and occupational therapy consistently sent April 25.  Last visit, I started her on levodopa, 100 mg 3 times per day.  She thinks that she is doing better.    She has had 2 falls since her last visit. With one, she went to the bathroom in the middle of the night and fell backward getting in the bed.  She hurt her L shoulder especially when she lifts it.  Physical therapist did recommend a Rollator and I gave her a prescription for that.  I noted that her primary care physician has given her a handicap placard.  She was supposed to have neuropsych testing done but she didn't call to schedule it when messages were left.  She is not driving and relying on friends for transportation.  Feels that swallowing is better.  Having some monocular diplopia that is vertical.    12/22/15 update:  The patient follows up today.  She is accompanied by her daughter in law who supplements the history.  She is on carbidopa/levodopa 25/100, one tablet 3 times per day (5:30am/11am/5pm).  She does not think that the medication is helping.  No SE with the medication.  She had 1 fall at her daughter in laws house getting out of the bed.  Didn't get hurt.  Family helped her up.  No lightheadedness or near syncope.  No  hallucinations.  She does not drive.  She does continue to have some diplopia.  She had neuropsych testing with Dr. Si Raider on 10/26/2015.  There was evidence of mild cognitive impairment and depression.  Pt denies any depression.  There was no evidence of dementia.  Lives alone but staying with her family m-w and then son takes her to church on wed night and daughter in law comes thurs.  She is alone Friday night.  Son comes to her house some Saturday and sundays, her other son takes her to church.  Family concerned about her being alone but pt absolutely refuses to leave home or let someone come in.  No swallowing issues or problems with choking.  Daughter in law states that she has had some choking.  One choking episode in a restaurant on liquid this past week.    03/29/16 update:  Pt is on carbidopa/levodopa 25/100 which was increased last visit to 2 tablets in the morning, one in the afternoon and one in the evening.  [facility] and was following with the patient and I reviewed their notes.  She did go for her therapy screen, but ultimately requested that she have home health instead.  A referral was placed for this just 2 days ago.  She has some coughing episodes and I got a note from [facility] yesterday asking for an order for a repeat MBE.  Coughing is mostly with liquids.  No falls.  Uses walker some of time but admits not all of the time.  Lots of blurry vision but no diplopia but noting change.  She is not driving.  She is preparing her own pill box without trouble.  Notes trouble with putting on pants.  Admits to depression   ALLERGIES:   Allergies  Allergen Reactions  . Codeine Phosphate     CURRENT MEDICATIONS:  Outpatient Encounter Prescriptions as of 03/29/2016  Medication Sig  . aspirin 81 MG tablet Take 81 mg by mouth daily.    . benazepril (LOTENSIN) 40 MG tablet TAKE 1 TABLET EVERY DAY  . Calcium Carbonate (CALTRATE 600) 1500 MG TABS Take by mouth 2 (two) times daily.    . carbidopa-levodopa  (SINEMET IR) 25-100 MG tablet 2 in AM, 1 in afternoon, 1 in evening  . Cholecalciferol (VITAMIN D) 1000 UNITS capsule Take 1,000 Units by mouth daily.    . hydrochlorothiazide (HYDRODIURIL) 25 MG tablet TAKE 1 TABLET EVERY DAY  . Multiple Vitamin (MULTIVITAMIN) tablet Take 1 tablet by mouth daily.    . Omega-3 Fatty Acids (FISH OIL) 1200 MG CAPS Take 1 capsule by mouth daily.   No facility-administered encounter medications on file as of 03/29/2016.     PAST MEDICAL HISTORY:   Past Medical History:  Diagnosis Date  . ALLERGIC RHINITIS 11/21/2006  . BACK PAIN 12/17/2006  . COLONIC POLYPS, HX OF 11/21/2006  . HYPERTENSION 11/21/2006  . OSTEOPENIA 08/11/2009  . OSTEOPOROSIS  11/21/2006  . PEDAL EDEMA 07/05/2008  . SYSTOLIC MURMUR [id number]    PAST SURGICAL HISTORY:   Past Surgical History:  Procedure Laterality Date  . BREAST SURGERY     bx  . CATARACT EXTRACTION Bilateral 2012  . HEMORRHOID SURGERY    . TONSILLECTOMY      SOCIAL HISTORY:   Social History   Social History  . Marital status: Single    Spouse name: N/A  . Number of children: N/A  . Years of education: N/A   Occupational History  . retired     Press photographer   Social History Main Topics  . Smoking status: Former Smoker    Quit date: 03/27/1995  . Smokeless tobacco: Never Used  . Alcohol use No  . Drug use: No  . Sexual activity: Not on file   Other Topics Concern  . Not on file   Social History Narrative  . No narrative on file    FAMILY HISTORY:   Family Status  Relation Status  . Mother Deceased at age 74   ovarian ca  . Father Deceased at age 1   cad  . Sister Alive   3, one with breast CA  . Sister Deceased   50, pancreatic CA, brain tumor  . Brother Alive   1, CAD  . Brother Deceased   60  . Child Alive   2 sons, alive and well    ROS:  A complete 10 system review of systems was obtained and was unremarkable apart from what is mentioned above.  PHYSICAL EXAMINATION:    VITALS:   Vitals:    03/29/16 1530  BP: 124/70  Pulse: 95  SpO2: 97%  Weight: 155 lb (70.3 kg)  Height: 5\' 4"  (1.626 m)    GEN:  The patient appears stated age and is in NAD. HEENT:  Normocephalic, atraumatic.  The mucous membranes are moist. The superficial temporal arteries are without ropiness or tenderness. CV:  RRR with 3/6 SEM that radiates to the right carotid. Lungs:  CTAB but she has some laryngeal stridor Neck/HEME:  There are no carotid bruits bilaterally.  Neurological examination:  Orientation: The patient is alert and oriented x3.  Cranial nerves: There is good facial symmetry. She has facial hypomimia.  She has complete downgaze paresis and near complete upgaze paresis.  She has significant square wave jerks.  The visual fields are full to confrontational testing. The speech is nonfluent but it is fairly clear. She has intermittent grunting.  The soft palate rises symmetrically and there is no tongue deviation. Hearing is intact to conversational tone. Sensation: Sensation is intact to light touch throughout Motor: Strength is 5/5 in the bilateral upper and lower extremities.   Tongue strength is good.  Shoulder shrug is equal and symmetric.  There is no pronator drift.  No fasciculations noted anywhere, including the tongue.   Movement examination: Tone: There is no rigidity Abnormal movements: no dyskinesia today Coordination:  There is good RAMs today Gait and Station: The patient has mild difficulty arising out of a deep-seated chair without the use of the hands. Stride length was much better and she swings the arms but festinates.  Labs:    Chemistry      Component Value Date/Time   NA 140 06/13/2015 1404   K 4.3 06/13/2015 1404   CL 99 06/13/2015 1404   CO2 30 06/13/2015 1404   BUN 16 06/13/2015 1404   CREATININE 1.10 (H) 06/13/2015 1404  Component Value Date/Time   CALCIUM 10.2 06/13/2015 1404   CALCIUM 10.2 06/13/2015 1404   ALKPHOS 63 06/13/2015 1404   AST 18  06/13/2015 1404   ALT 15 06/13/2015 1404   BILITOT 0.6 06/13/2015 1404     Lab Results  Component Value Date   TSH 1.35 06/13/2015      ASSESSMENT/PLAN:  1.  Probable PSP  -She had a dat scan done on 07/05/2015 with abnormal significant bilateral reduction and putamen uptake with activity largely confined to the caudate nuclei bilaterally.  - She had a modified barium swallow done on 06/29/2015.  There was moderate pharyngeal phase dysphagia.  Regular diet with thin liquids was recommended.  A chin tuck maneuver was helpful.  She is having more choking and speech therapy recommending a repeat MBE.  Will order.  - did well with PT  -Continue carbidopa/levodopa 25/100 to 2/1/1.  Clinically looks better although pt not sure that it helps  -talked to her again about living alone.  Very resistant to moving or having someone is in the home.  Her family is doing a great job trying to caregivers, but are not with her all of the time.  We talked about various options.  We will get a [facility] consult as well. 2.  Depression  -start lexapro 10 mg daily and will call her in 6 weeks.  If no help will increase to 20 mg daily 3.  HTN  -may not need as much med or med at all.  Will ask PCP to keep an eye on this.  Pt already noting a drop in BP 4.  Mild cognitive impairment  -She had neuropsych testing with Dr. Si Raider on 10/26/2015.  There was evidence of mild cognitive impairment and depression.  There was no evidence of dementia.  -Concerned about living alone, primarily because of physical status, but also mental status. 5.  Spent 40 minutes in the room with the patient today.  Much greater than 50% of this visit was spent in counseling.

## 2016-03-29 ENCOUNTER — Encounter: Payer: Self-pay | Admitting: Neurology

## 2016-03-29 ENCOUNTER — Ambulatory Visit (INDEPENDENT_AMBULATORY_CARE_PROVIDER_SITE_OTHER): Payer: Commercial Managed Care - HMO | Admitting: Neurology

## 2016-03-29 VITALS — BP 124/70 | HR 95 | Ht 64.0 in | Wt 155.0 lb

## 2016-03-29 DIAGNOSIS — R1319 Other dysphagia: Secondary | ICD-10-CM | POA: Diagnosis not present

## 2016-03-29 DIAGNOSIS — F330 Major depressive disorder, recurrent, mild: Secondary | ICD-10-CM

## 2016-03-29 DIAGNOSIS — G231 Progressive supranuclear ophthalmoplegia [Steele-Richardson-Olszewski]: Secondary | ICD-10-CM

## 2016-03-29 MED ORDER — ESCITALOPRAM OXALATE 10 MG PO TABS: 10.0000 mg | ORAL_TABLET | Freq: Every day | ORAL | 1 refills | Status: DC

## 2016-03-29 NOTE — Patient Instructions (Addendum)
1.We have scheduled you at [facility] for your modified barium swallow on 04/10/16 at 11:30 am. Please arrive 15 minutes prior and go to 1st floor radiology. If you need to reschedule for any reason please call [phone].  2.  Look into betabrand clothing and even maternity clothing that is easier to get on and off.  Tiffany [name] is Administrator, sports   3. Start Lexapro 10 mg once daily. We will call you in 6 weeks to see how you are doing. We may need to increase this dosage.

## 2016-03-30 ENCOUNTER — Other Ambulatory Visit (HOSPITAL_COMMUNITY): Payer: Self-pay | Admitting: Neurology

## 2016-03-30 DIAGNOSIS — R1319 Other dysphagia: Secondary | ICD-10-CM

## 2016-04-04 DIAGNOSIS — G231 Progressive supranuclear ophthalmoplegia [Steele-Richardson-Olszewski]: Secondary | ICD-10-CM | POA: Diagnosis not present

## 2016-04-04 DIAGNOSIS — I10 Essential (primary) hypertension: Secondary | ICD-10-CM | POA: Diagnosis not present

## 2016-04-04 DIAGNOSIS — G3184 Mild cognitive impairment, so stated: Secondary | ICD-10-CM | POA: Diagnosis not present

## 2016-04-04 DIAGNOSIS — F329 Major depressive disorder, single episode, unspecified: Secondary | ICD-10-CM | POA: Diagnosis not present

## 2016-04-04 DIAGNOSIS — M8580 Other specified disorders of bone density and structure, unspecified site: Secondary | ICD-10-CM | POA: Diagnosis not present

## 2016-04-04 DIAGNOSIS — Z7982 Long term (current) use of aspirin: Secondary | ICD-10-CM | POA: Diagnosis not present

## 2016-04-04 DIAGNOSIS — M810 Age-related osteoporosis without current pathological fracture: Secondary | ICD-10-CM | POA: Diagnosis not present

## 2016-04-04 DIAGNOSIS — R1313 Dysphagia, pharyngeal phase: Secondary | ICD-10-CM | POA: Diagnosis not present

## 2016-04-04 DIAGNOSIS — R4701 Aphasia: Secondary | ICD-10-CM | POA: Diagnosis not present

## 2016-04-05 DIAGNOSIS — R1313 Dysphagia, pharyngeal phase: Secondary | ICD-10-CM | POA: Diagnosis not present

## 2016-04-05 DIAGNOSIS — Z7982 Long term (current) use of aspirin: Secondary | ICD-10-CM | POA: Diagnosis not present

## 2016-04-05 DIAGNOSIS — G3184 Mild cognitive impairment, so stated: Secondary | ICD-10-CM | POA: Diagnosis not present

## 2016-04-05 DIAGNOSIS — F329 Major depressive disorder, single episode, unspecified: Secondary | ICD-10-CM | POA: Diagnosis not present

## 2016-04-05 DIAGNOSIS — R4701 Aphasia: Secondary | ICD-10-CM | POA: Diagnosis not present

## 2016-04-05 DIAGNOSIS — I10 Essential (primary) hypertension: Secondary | ICD-10-CM | POA: Diagnosis not present

## 2016-04-05 DIAGNOSIS — G231 Progressive supranuclear ophthalmoplegia [Steele-Richardson-Olszewski]: Secondary | ICD-10-CM | POA: Diagnosis not present

## 2016-04-05 DIAGNOSIS — M8580 Other specified disorders of bone density and structure, unspecified site: Secondary | ICD-10-CM | POA: Diagnosis not present

## 2016-04-05 DIAGNOSIS — M810 Age-related osteoporosis without current pathological fracture: Secondary | ICD-10-CM | POA: Diagnosis not present

## 2016-04-06 ENCOUNTER — Telehealth: Payer: Self-pay | Admitting: Neurology

## 2016-04-06 NOTE — Telephone Encounter (Signed)
Need orders for home health PT -  1 week  = 1 2 times for 4 weeks 1 week = 1  Please call

## 2016-04-06 NOTE — Telephone Encounter (Signed)
PT given verbal orders for patient to continue therapy.

## 2016-04-10 ENCOUNTER — Ambulatory Visit (INDEPENDENT_AMBULATORY_CARE_PROVIDER_SITE_OTHER)
Admission: RE | Admit: 2016-04-10 | Discharge: 2016-04-10 | Disposition: A | Discharge status: Home / Self Care | Payer: Medicare HMO | Source: Ambulatory Visit | Attending: Internal Medicine | Admitting: Internal Medicine

## 2016-04-10 ENCOUNTER — Encounter: Payer: Self-pay | Admitting: Internal Medicine

## 2016-04-10 ENCOUNTER — Ambulatory Visit (INDEPENDENT_AMBULATORY_CARE_PROVIDER_SITE_OTHER): Payer: Medicare HMO | Admitting: Internal Medicine

## 2016-04-10 ENCOUNTER — Ambulatory Visit (HOSPITAL_COMMUNITY)
Admission: RE | Admit: 2016-04-10 | Discharge: 2016-04-10 | Disposition: A | Discharge status: Home / Self Care | Payer: Medicare HMO | Source: Ambulatory Visit | Attending: Neurology | Admitting: Neurology

## 2016-04-10 ENCOUNTER — Ambulatory Visit (HOSPITAL_COMMUNITY)
Admission: RE | Admit: 2016-04-10 | Discharge: 2016-04-10 | Disposition: A | Discharge status: Home / Self Care | Payer: Medicare HMO | Source: Ambulatory Visit | Attending: Internal Medicine | Admitting: Internal Medicine

## 2016-04-10 VITALS — BP 140/78 | HR 77 | Temp 97.8°F | Ht 64.0 in | Wt 149.8 lb

## 2016-04-10 DIAGNOSIS — M810 Age-related osteoporosis without current pathological fracture: Secondary | ICD-10-CM | POA: Insufficient documentation

## 2016-04-10 DIAGNOSIS — R1310 Dysphagia, unspecified: Secondary | ICD-10-CM | POA: Diagnosis not present

## 2016-04-10 DIAGNOSIS — M818 Other osteoporosis without current pathological fracture: Secondary | ICD-10-CM

## 2016-04-10 DIAGNOSIS — G231 Progressive supranuclear ophthalmoplegia [Steele-Richardson-Olszewski]: Secondary | ICD-10-CM

## 2016-04-10 DIAGNOSIS — R1319 Other dysphagia: Secondary | ICD-10-CM

## 2016-04-10 DIAGNOSIS — M545 Low back pain, unspecified: Secondary | ICD-10-CM

## 2016-04-10 DIAGNOSIS — S3992XA Unspecified injury of lower back, initial encounter: Secondary | ICD-10-CM | POA: Diagnosis not present

## 2016-04-10 DIAGNOSIS — Z7982 Long term (current) use of aspirin: Secondary | ICD-10-CM | POA: Diagnosis not present

## 2016-04-10 DIAGNOSIS — I10 Essential (primary) hypertension: Secondary | ICD-10-CM | POA: Diagnosis not present

## 2016-04-10 DIAGNOSIS — R4701 Aphasia: Secondary | ICD-10-CM | POA: Diagnosis not present

## 2016-04-10 DIAGNOSIS — R05 Cough: Secondary | ICD-10-CM | POA: Diagnosis not present

## 2016-04-10 DIAGNOSIS — R011 Cardiac murmur, unspecified: Secondary | ICD-10-CM | POA: Diagnosis not present

## 2016-04-10 DIAGNOSIS — M8580 Other specified disorders of bone density and structure, unspecified site: Secondary | ICD-10-CM | POA: Diagnosis not present

## 2016-04-10 DIAGNOSIS — G3184 Mild cognitive impairment, so stated: Secondary | ICD-10-CM | POA: Diagnosis not present

## 2016-04-10 DIAGNOSIS — F329 Major depressive disorder, single episode, unspecified: Secondary | ICD-10-CM | POA: Diagnosis not present

## 2016-04-10 DIAGNOSIS — R1313 Dysphagia, pharyngeal phase: Secondary | ICD-10-CM | POA: Diagnosis not present

## 2016-04-10 NOTE — Patient Instructions (Addendum)
X-rays of the lumbar spine as discussed  Neurology follow-up as scheduled  Take [phone] mg of Tylenol every 6 hours as needed for pain relief or fever.  Avoid taking more than 3000 mg in a 24-hour period (  This may cause liver damage).  Take 400-600 mg of ibuprofen ( Advil, Motrin) with food every 4 to 6 hours as needed for pain relief or control of fever  Treatment for this condition may include:  Putting heat and cold on the affected area.  Medicines to help relieve pain and relax your muscles (muscle relaxants).  NSAIDs to help reduce swelling and discomfort. When your symptoms improve, it is important to gradually return to your normal routine as soon as possible to reduce pain, avoid stiffness, and avoid loss of muscle strength. Generally, symptoms should improve within 6 weeks of treatment. However, recovery time varies.

## 2016-04-10 NOTE — Progress Notes (Signed)
Modified Barium Swallow Progress Note  Patient Details  Name: Tiffany [name] MRN: [id number] Date of Birth: December 19, 1932  Today's Date: 04/10/2016  Modified Barium Swallow completed.  Full report located under Chart Review in the Imaging Section.  Brief recommendations include the following:  Clinical Impression  Mild oral dysphagia due to mildly prolonged mastication and transit with solid. Moderate pharygneal dysphagia marked by motor impairments including reduced tongue based retraction, decreased laryngeal elevation and closure leading to penetration to vocal cords with thin and chin tuck strategy. Nectar thick liquids aspirated and utilization of chin tuck ineffecitve. Min-mild vallecular and pyriform sinus residue given reduced tongue based retraction and decreased laryngeal elevation. Recommend Dys 3 texture and nectar thick liquids, tuck chin liquids. Thorougly educated pt and caregiver re: where to purchase thickener and strategies. Due to the amount of information, recommendations and possible mild cognitive abilites, highly recommend Ren[facility] to observe pt and assist with strategies/mixing liquids etc.        Swallow Evaluation Recommendations       SLP Diet Recommendations: Dysphagia 3 (Mech soft) solids;Nectar thick liquid   Liquid Administration via: Cup;No straw   Medication Administration: Whole meds with puree   Supervision: Patient able to self feed   Compensations: Chin tuck (chin tuck with nectar liquids)   Postural Changes: Seated upright at 90 degrees   Oral Care Recommendations: Oral care BID        Houston Siren 04/10/2016,4:24 PM   Tiffany [name].[employer] [phone]

## 2016-04-10 NOTE — Progress Notes (Signed)
   Subjective:    Patient ID: Tiffany [name], female    DOB: September 07, 1932, 83 y.o.   MRN: [id number]  HPI  83 year old patient who has a history of PSP and unsteady gait.  9 days ago while attempting to make her bed, she fell backwards and has had lumbar pain since this time. She is scheduled for a swallowing study later today. Pain is aggravated by movement.  She also has a history of osteoporosis   Review of Systems  Constitutional: Negative.   HENT: Positive for hearing loss. Negative for congestion, dental problem, rhinorrhea, sinus pressure, sore throat and tinnitus.   Eyes: Negative for pain, discharge and visual disturbance.  Respiratory: Negative for cough and shortness of breath.   Cardiovascular: Negative for chest pain, palpitations and leg swelling.  Gastrointestinal: Negative for abdominal distention, abdominal pain, blood in stool, constipation, diarrhea, nausea and vomiting.  Genitourinary: Negative for difficulty urinating, dysuria, flank pain, frequency, hematuria, pelvic pain, urgency, vaginal bleeding, vaginal discharge and vaginal pain.  Musculoskeletal: Positive for back pain and gait problem. Negative for arthralgias and joint swelling.  Skin: Negative for rash.  Neurological: Positive for speech difficulty. Negative for dizziness, syncope, weakness, numbness and headaches.  Hematological: Negative for adenopathy.  Psychiatric/Behavioral: Negative for agitation, behavioral problems and dysphoric mood. The patient is not nervous/anxious.        Objective:   Physical Exam  Constitutional: She appears well-developed and well-nourished. No distress.  HENT:  Hearing aid present on the right  Cardiovascular:  Murmur heard. Murmur transmitted to the carotid distribution  Musculoskeletal:  No lumbar tenderness to palpation Negative straight leg test Full range of motion without pain involving both hip areas  Neurological:  Unsteady gait          Assessment &  Plan:   Posttraumatic lumbar pain.  Will check radiographs to rule out acute fracture PSP.  Swallow study as scheduled.  Neurology follow-up Aortic stenosis  Nyoka Cowden

## 2016-04-10 NOTE — Progress Notes (Signed)
Pre visit review using our clinic review tool, if applicable. No additional management support is needed unless otherwise documented below in the visit note. 

## 2016-04-13 ENCOUNTER — Telehealth: Payer: Self-pay | Admitting: Neurology

## 2016-04-13 DIAGNOSIS — F329 Major depressive disorder, single episode, unspecified: Secondary | ICD-10-CM | POA: Diagnosis not present

## 2016-04-13 DIAGNOSIS — I10 Essential (primary) hypertension: Secondary | ICD-10-CM | POA: Diagnosis not present

## 2016-04-13 DIAGNOSIS — G231 Progressive supranuclear ophthalmoplegia [Steele-Richardson-Olszewski]: Secondary | ICD-10-CM | POA: Diagnosis not present

## 2016-04-13 DIAGNOSIS — R4701 Aphasia: Secondary | ICD-10-CM | POA: Diagnosis not present

## 2016-04-13 DIAGNOSIS — M810 Age-related osteoporosis without current pathological fracture: Secondary | ICD-10-CM | POA: Diagnosis not present

## 2016-04-13 DIAGNOSIS — R1313 Dysphagia, pharyngeal phase: Secondary | ICD-10-CM | POA: Diagnosis not present

## 2016-04-13 DIAGNOSIS — Z7982 Long term (current) use of aspirin: Secondary | ICD-10-CM | POA: Diagnosis not present

## 2016-04-13 DIAGNOSIS — M8580 Other specified disorders of bone density and structure, unspecified site: Secondary | ICD-10-CM | POA: Diagnosis not present

## 2016-04-13 DIAGNOSIS — G3184 Mild cognitive impairment, so stated: Secondary | ICD-10-CM | POA: Diagnosis not present

## 2016-04-13 NOTE — Telephone Encounter (Signed)
Tiffany [name] 25-Aug-2032. Sharyn Lull OT from [facility] called regarding having to move her OT Evaluation to next week. She was needing verbal orders. Thank you

## 2016-04-14 DIAGNOSIS — R1313 Dysphagia, pharyngeal phase: Secondary | ICD-10-CM | POA: Diagnosis not present

## 2016-04-14 DIAGNOSIS — R4701 Aphasia: Secondary | ICD-10-CM | POA: Diagnosis not present

## 2016-04-14 DIAGNOSIS — G3184 Mild cognitive impairment, so stated: Secondary | ICD-10-CM | POA: Diagnosis not present

## 2016-04-14 DIAGNOSIS — M810 Age-related osteoporosis without current pathological fracture: Secondary | ICD-10-CM | POA: Diagnosis not present

## 2016-04-14 DIAGNOSIS — F329 Major depressive disorder, single episode, unspecified: Secondary | ICD-10-CM | POA: Diagnosis not present

## 2016-04-14 DIAGNOSIS — Z7982 Long term (current) use of aspirin: Secondary | ICD-10-CM | POA: Diagnosis not present

## 2016-04-14 DIAGNOSIS — M8580 Other specified disorders of bone density and structure, unspecified site: Secondary | ICD-10-CM | POA: Diagnosis not present

## 2016-04-14 DIAGNOSIS — I10 Essential (primary) hypertension: Secondary | ICD-10-CM | POA: Diagnosis not present

## 2016-04-14 DIAGNOSIS — G231 Progressive supranuclear ophthalmoplegia [Steele-Richardson-Olszewski]: Secondary | ICD-10-CM | POA: Diagnosis not present

## 2016-04-16 ENCOUNTER — Telehealth: Payer: Self-pay | Admitting: Neurology

## 2016-04-16 NOTE — Telephone Encounter (Signed)
Called and spoke with Sharyn Lull, [place], and gave verbal orders for her to see patient this week from last week (cancelled due to weather). She also confirmed that the patient is seeing ST.

## 2016-04-16 NOTE — Telephone Encounter (Signed)
No number to call back to give orders. Will await another call.

## 2016-04-16 NOTE — Telephone Encounter (Signed)
-----   Message from [place], DO sent at 04/10/2016  4:37 PM EST ----- Please let pt know that home ST recommended.  If agreeable, will need to be in conjunction with her PT.  Also make sure that you send copy of swallow eval if agreeable

## 2016-04-16 NOTE — Telephone Encounter (Signed)
Tiffany [name] called in regards to PT and would like a call back/Dawn CB# [phone]

## 2016-04-17 DIAGNOSIS — M810 Age-related osteoporosis without current pathological fracture: Secondary | ICD-10-CM | POA: Diagnosis not present

## 2016-04-17 DIAGNOSIS — R4701 Aphasia: Secondary | ICD-10-CM | POA: Diagnosis not present

## 2016-04-17 DIAGNOSIS — Z7982 Long term (current) use of aspirin: Secondary | ICD-10-CM | POA: Diagnosis not present

## 2016-04-17 DIAGNOSIS — R1313 Dysphagia, pharyngeal phase: Secondary | ICD-10-CM | POA: Diagnosis not present

## 2016-04-17 DIAGNOSIS — G3184 Mild cognitive impairment, so stated: Secondary | ICD-10-CM | POA: Diagnosis not present

## 2016-04-17 DIAGNOSIS — F329 Major depressive disorder, single episode, unspecified: Secondary | ICD-10-CM | POA: Diagnosis not present

## 2016-04-17 DIAGNOSIS — M8580 Other specified disorders of bone density and structure, unspecified site: Secondary | ICD-10-CM | POA: Diagnosis not present

## 2016-04-17 DIAGNOSIS — G231 Progressive supranuclear ophthalmoplegia [Steele-Richardson-Olszewski]: Secondary | ICD-10-CM | POA: Diagnosis not present

## 2016-04-17 DIAGNOSIS — I10 Essential (primary) hypertension: Secondary | ICD-10-CM | POA: Diagnosis not present

## 2016-04-18 DIAGNOSIS — M8580 Other specified disorders of bone density and structure, unspecified site: Secondary | ICD-10-CM | POA: Diagnosis not present

## 2016-04-18 DIAGNOSIS — G231 Progressive supranuclear ophthalmoplegia [Steele-Richardson-Olszewski]: Secondary | ICD-10-CM | POA: Diagnosis not present

## 2016-04-18 DIAGNOSIS — R1313 Dysphagia, pharyngeal phase: Secondary | ICD-10-CM | POA: Diagnosis not present

## 2016-04-18 DIAGNOSIS — I10 Essential (primary) hypertension: Secondary | ICD-10-CM | POA: Diagnosis not present

## 2016-04-18 DIAGNOSIS — Z7982 Long term (current) use of aspirin: Secondary | ICD-10-CM | POA: Diagnosis not present

## 2016-04-18 DIAGNOSIS — F329 Major depressive disorder, single episode, unspecified: Secondary | ICD-10-CM | POA: Diagnosis not present

## 2016-04-18 DIAGNOSIS — M810 Age-related osteoporosis without current pathological fracture: Secondary | ICD-10-CM | POA: Diagnosis not present

## 2016-04-18 DIAGNOSIS — G3184 Mild cognitive impairment, so stated: Secondary | ICD-10-CM | POA: Diagnosis not present

## 2016-04-18 DIAGNOSIS — R4701 Aphasia: Secondary | ICD-10-CM | POA: Diagnosis not present

## 2016-04-19 DIAGNOSIS — R4701 Aphasia: Secondary | ICD-10-CM | POA: Diagnosis not present

## 2016-04-19 DIAGNOSIS — F329 Major depressive disorder, single episode, unspecified: Secondary | ICD-10-CM | POA: Diagnosis not present

## 2016-04-19 DIAGNOSIS — M8580 Other specified disorders of bone density and structure, unspecified site: Secondary | ICD-10-CM | POA: Diagnosis not present

## 2016-04-19 DIAGNOSIS — R1313 Dysphagia, pharyngeal phase: Secondary | ICD-10-CM | POA: Diagnosis not present

## 2016-04-19 DIAGNOSIS — G231 Progressive supranuclear ophthalmoplegia [Steele-Richardson-Olszewski]: Secondary | ICD-10-CM | POA: Diagnosis not present

## 2016-04-19 DIAGNOSIS — Z7982 Long term (current) use of aspirin: Secondary | ICD-10-CM | POA: Diagnosis not present

## 2016-04-19 DIAGNOSIS — M810 Age-related osteoporosis without current pathological fracture: Secondary | ICD-10-CM | POA: Diagnosis not present

## 2016-04-19 DIAGNOSIS — I10 Essential (primary) hypertension: Secondary | ICD-10-CM | POA: Diagnosis not present

## 2016-04-19 DIAGNOSIS — G3184 Mild cognitive impairment, so stated: Secondary | ICD-10-CM | POA: Diagnosis not present

## 2016-04-20 ENCOUNTER — Telehealth: Payer: Self-pay | Admitting: Neurology

## 2016-04-20 DIAGNOSIS — M810 Age-related osteoporosis without current pathological fracture: Secondary | ICD-10-CM | POA: Diagnosis not present

## 2016-04-20 DIAGNOSIS — G3184 Mild cognitive impairment, so stated: Secondary | ICD-10-CM | POA: Diagnosis not present

## 2016-04-20 DIAGNOSIS — M8580 Other specified disorders of bone density and structure, unspecified site: Secondary | ICD-10-CM | POA: Diagnosis not present

## 2016-04-20 DIAGNOSIS — F329 Major depressive disorder, single episode, unspecified: Secondary | ICD-10-CM | POA: Diagnosis not present

## 2016-04-20 DIAGNOSIS — R4701 Aphasia: Secondary | ICD-10-CM | POA: Diagnosis not present

## 2016-04-20 DIAGNOSIS — I10 Essential (primary) hypertension: Secondary | ICD-10-CM | POA: Diagnosis not present

## 2016-04-20 DIAGNOSIS — G231 Progressive supranuclear ophthalmoplegia [Steele-Richardson-Olszewski]: Secondary | ICD-10-CM | POA: Diagnosis not present

## 2016-04-20 DIAGNOSIS — R1313 Dysphagia, pharyngeal phase: Secondary | ICD-10-CM | POA: Diagnosis not present

## 2016-04-20 DIAGNOSIS — Z7982 Long term (current) use of aspirin: Secondary | ICD-10-CM | POA: Diagnosis not present

## 2016-04-20 NOTE — Telephone Encounter (Signed)
Verbal orders given to Michelle. 

## 2016-04-20 NOTE — Telephone Encounter (Signed)
Needing Clarification orders to see this patient. Sharyn Lull from [facility] called # [phone]. She went to the patients home but unable to see her due to no clarification orders? She would like you to please call her. Thank you

## 2016-04-23 ENCOUNTER — Other Ambulatory Visit: Payer: Self-pay | Admitting: Internal Medicine

## 2016-04-23 DIAGNOSIS — M8580 Other specified disorders of bone density and structure, unspecified site: Secondary | ICD-10-CM | POA: Diagnosis not present

## 2016-04-23 DIAGNOSIS — R1313 Dysphagia, pharyngeal phase: Secondary | ICD-10-CM | POA: Diagnosis not present

## 2016-04-23 DIAGNOSIS — G3184 Mild cognitive impairment, so stated: Secondary | ICD-10-CM | POA: Diagnosis not present

## 2016-04-23 DIAGNOSIS — Z7982 Long term (current) use of aspirin: Secondary | ICD-10-CM | POA: Diagnosis not present

## 2016-04-23 DIAGNOSIS — F329 Major depressive disorder, single episode, unspecified: Secondary | ICD-10-CM | POA: Diagnosis not present

## 2016-04-23 DIAGNOSIS — G231 Progressive supranuclear ophthalmoplegia [Steele-Richardson-Olszewski]: Secondary | ICD-10-CM | POA: Diagnosis not present

## 2016-04-23 DIAGNOSIS — R4701 Aphasia: Secondary | ICD-10-CM | POA: Diagnosis not present

## 2016-04-23 DIAGNOSIS — M810 Age-related osteoporosis without current pathological fracture: Secondary | ICD-10-CM | POA: Diagnosis not present

## 2016-04-23 DIAGNOSIS — I10 Essential (primary) hypertension: Secondary | ICD-10-CM | POA: Diagnosis not present

## 2016-04-24 DIAGNOSIS — M8580 Other specified disorders of bone density and structure, unspecified site: Secondary | ICD-10-CM | POA: Diagnosis not present

## 2016-04-24 DIAGNOSIS — G231 Progressive supranuclear ophthalmoplegia [Steele-Richardson-Olszewski]: Secondary | ICD-10-CM | POA: Diagnosis not present

## 2016-04-24 DIAGNOSIS — M810 Age-related osteoporosis without current pathological fracture: Secondary | ICD-10-CM | POA: Diagnosis not present

## 2016-04-24 DIAGNOSIS — R4701 Aphasia: Secondary | ICD-10-CM | POA: Diagnosis not present

## 2016-04-24 DIAGNOSIS — Z7982 Long term (current) use of aspirin: Secondary | ICD-10-CM | POA: Diagnosis not present

## 2016-04-24 DIAGNOSIS — R1313 Dysphagia, pharyngeal phase: Secondary | ICD-10-CM | POA: Diagnosis not present

## 2016-04-24 DIAGNOSIS — I10 Essential (primary) hypertension: Secondary | ICD-10-CM | POA: Diagnosis not present

## 2016-04-24 DIAGNOSIS — F329 Major depressive disorder, single episode, unspecified: Secondary | ICD-10-CM | POA: Diagnosis not present

## 2016-04-24 DIAGNOSIS — G3184 Mild cognitive impairment, so stated: Secondary | ICD-10-CM | POA: Diagnosis not present

## 2016-04-26 DIAGNOSIS — M8580 Other specified disorders of bone density and structure, unspecified site: Secondary | ICD-10-CM | POA: Diagnosis not present

## 2016-04-26 DIAGNOSIS — F329 Major depressive disorder, single episode, unspecified: Secondary | ICD-10-CM | POA: Diagnosis not present

## 2016-04-26 DIAGNOSIS — G231 Progressive supranuclear ophthalmoplegia [Steele-Richardson-Olszewski]: Secondary | ICD-10-CM | POA: Diagnosis not present

## 2016-04-26 DIAGNOSIS — Z7982 Long term (current) use of aspirin: Secondary | ICD-10-CM | POA: Diagnosis not present

## 2016-04-26 DIAGNOSIS — G3184 Mild cognitive impairment, so stated: Secondary | ICD-10-CM | POA: Diagnosis not present

## 2016-04-26 DIAGNOSIS — R4701 Aphasia: Secondary | ICD-10-CM | POA: Diagnosis not present

## 2016-04-26 DIAGNOSIS — M810 Age-related osteoporosis without current pathological fracture: Secondary | ICD-10-CM | POA: Diagnosis not present

## 2016-04-26 DIAGNOSIS — R1313 Dysphagia, pharyngeal phase: Secondary | ICD-10-CM | POA: Diagnosis not present

## 2016-04-26 DIAGNOSIS — I10 Essential (primary) hypertension: Secondary | ICD-10-CM | POA: Diagnosis not present

## 2016-04-27 DIAGNOSIS — G3184 Mild cognitive impairment, so stated: Secondary | ICD-10-CM | POA: Diagnosis not present

## 2016-04-27 DIAGNOSIS — M810 Age-related osteoporosis without current pathological fracture: Secondary | ICD-10-CM | POA: Diagnosis not present

## 2016-04-27 DIAGNOSIS — G231 Progressive supranuclear ophthalmoplegia [Steele-Richardson-Olszewski]: Secondary | ICD-10-CM | POA: Diagnosis not present

## 2016-04-27 DIAGNOSIS — I10 Essential (primary) hypertension: Secondary | ICD-10-CM | POA: Diagnosis not present

## 2016-04-27 DIAGNOSIS — R4701 Aphasia: Secondary | ICD-10-CM | POA: Diagnosis not present

## 2016-04-27 DIAGNOSIS — M8580 Other specified disorders of bone density and structure, unspecified site: Secondary | ICD-10-CM | POA: Diagnosis not present

## 2016-04-27 DIAGNOSIS — F329 Major depressive disorder, single episode, unspecified: Secondary | ICD-10-CM | POA: Diagnosis not present

## 2016-04-27 DIAGNOSIS — Z7982 Long term (current) use of aspirin: Secondary | ICD-10-CM | POA: Diagnosis not present

## 2016-04-27 DIAGNOSIS — R1313 Dysphagia, pharyngeal phase: Secondary | ICD-10-CM | POA: Diagnosis not present

## 2016-04-30 DIAGNOSIS — Z7982 Long term (current) use of aspirin: Secondary | ICD-10-CM | POA: Diagnosis not present

## 2016-04-30 DIAGNOSIS — R4701 Aphasia: Secondary | ICD-10-CM | POA: Diagnosis not present

## 2016-04-30 DIAGNOSIS — G231 Progressive supranuclear ophthalmoplegia [Steele-Richardson-Olszewski]: Secondary | ICD-10-CM | POA: Diagnosis not present

## 2016-04-30 DIAGNOSIS — F329 Major depressive disorder, single episode, unspecified: Secondary | ICD-10-CM | POA: Diagnosis not present

## 2016-04-30 DIAGNOSIS — G3184 Mild cognitive impairment, so stated: Secondary | ICD-10-CM | POA: Diagnosis not present

## 2016-04-30 DIAGNOSIS — M8580 Other specified disorders of bone density and structure, unspecified site: Secondary | ICD-10-CM | POA: Diagnosis not present

## 2016-04-30 DIAGNOSIS — I10 Essential (primary) hypertension: Secondary | ICD-10-CM | POA: Diagnosis not present

## 2016-04-30 DIAGNOSIS — M810 Age-related osteoporosis without current pathological fracture: Secondary | ICD-10-CM | POA: Diagnosis not present

## 2016-04-30 DIAGNOSIS — R1313 Dysphagia, pharyngeal phase: Secondary | ICD-10-CM | POA: Diagnosis not present

## 2016-05-01 DIAGNOSIS — Z7982 Long term (current) use of aspirin: Secondary | ICD-10-CM | POA: Diagnosis not present

## 2016-05-01 DIAGNOSIS — G231 Progressive supranuclear ophthalmoplegia [Steele-Richardson-Olszewski]: Secondary | ICD-10-CM | POA: Diagnosis not present

## 2016-05-01 DIAGNOSIS — R4701 Aphasia: Secondary | ICD-10-CM | POA: Diagnosis not present

## 2016-05-01 DIAGNOSIS — F329 Major depressive disorder, single episode, unspecified: Secondary | ICD-10-CM | POA: Diagnosis not present

## 2016-05-01 DIAGNOSIS — M8580 Other specified disorders of bone density and structure, unspecified site: Secondary | ICD-10-CM | POA: Diagnosis not present

## 2016-05-01 DIAGNOSIS — G3184 Mild cognitive impairment, so stated: Secondary | ICD-10-CM | POA: Diagnosis not present

## 2016-05-01 DIAGNOSIS — M810 Age-related osteoporosis without current pathological fracture: Secondary | ICD-10-CM | POA: Diagnosis not present

## 2016-05-01 DIAGNOSIS — I10 Essential (primary) hypertension: Secondary | ICD-10-CM | POA: Diagnosis not present

## 2016-05-01 DIAGNOSIS — R1313 Dysphagia, pharyngeal phase: Secondary | ICD-10-CM | POA: Diagnosis not present

## 2016-05-02 DIAGNOSIS — M8580 Other specified disorders of bone density and structure, unspecified site: Secondary | ICD-10-CM | POA: Diagnosis not present

## 2016-05-02 DIAGNOSIS — I10 Essential (primary) hypertension: Secondary | ICD-10-CM | POA: Diagnosis not present

## 2016-05-02 DIAGNOSIS — Z7982 Long term (current) use of aspirin: Secondary | ICD-10-CM | POA: Diagnosis not present

## 2016-05-02 DIAGNOSIS — F329 Major depressive disorder, single episode, unspecified: Secondary | ICD-10-CM | POA: Diagnosis not present

## 2016-05-02 DIAGNOSIS — G3184 Mild cognitive impairment, so stated: Secondary | ICD-10-CM | POA: Diagnosis not present

## 2016-05-02 DIAGNOSIS — R1313 Dysphagia, pharyngeal phase: Secondary | ICD-10-CM | POA: Diagnosis not present

## 2016-05-02 DIAGNOSIS — R4701 Aphasia: Secondary | ICD-10-CM | POA: Diagnosis not present

## 2016-05-02 DIAGNOSIS — G231 Progressive supranuclear ophthalmoplegia [Steele-Richardson-Olszewski]: Secondary | ICD-10-CM | POA: Diagnosis not present

## 2016-05-02 DIAGNOSIS — M810 Age-related osteoporosis without current pathological fracture: Secondary | ICD-10-CM | POA: Diagnosis not present

## 2016-05-03 DIAGNOSIS — M8580 Other specified disorders of bone density and structure, unspecified site: Secondary | ICD-10-CM | POA: Diagnosis not present

## 2016-05-03 DIAGNOSIS — R1313 Dysphagia, pharyngeal phase: Secondary | ICD-10-CM | POA: Diagnosis not present

## 2016-05-03 DIAGNOSIS — G3184 Mild cognitive impairment, so stated: Secondary | ICD-10-CM | POA: Diagnosis not present

## 2016-05-03 DIAGNOSIS — G231 Progressive supranuclear ophthalmoplegia [Steele-Richardson-Olszewski]: Secondary | ICD-10-CM | POA: Diagnosis not present

## 2016-05-03 DIAGNOSIS — F329 Major depressive disorder, single episode, unspecified: Secondary | ICD-10-CM | POA: Diagnosis not present

## 2016-05-03 DIAGNOSIS — R4701 Aphasia: Secondary | ICD-10-CM | POA: Diagnosis not present

## 2016-05-03 DIAGNOSIS — I10 Essential (primary) hypertension: Secondary | ICD-10-CM | POA: Diagnosis not present

## 2016-05-03 DIAGNOSIS — Z7982 Long term (current) use of aspirin: Secondary | ICD-10-CM | POA: Diagnosis not present

## 2016-05-03 DIAGNOSIS — M810 Age-related osteoporosis without current pathological fracture: Secondary | ICD-10-CM | POA: Diagnosis not present

## 2016-05-07 DIAGNOSIS — M810 Age-related osteoporosis without current pathological fracture: Secondary | ICD-10-CM | POA: Diagnosis not present

## 2016-05-07 DIAGNOSIS — Z7982 Long term (current) use of aspirin: Secondary | ICD-10-CM | POA: Diagnosis not present

## 2016-05-07 DIAGNOSIS — I10 Essential (primary) hypertension: Secondary | ICD-10-CM | POA: Diagnosis not present

## 2016-05-07 DIAGNOSIS — G231 Progressive supranuclear ophthalmoplegia [Steele-Richardson-Olszewski]: Secondary | ICD-10-CM | POA: Diagnosis not present

## 2016-05-07 DIAGNOSIS — F329 Major depressive disorder, single episode, unspecified: Secondary | ICD-10-CM | POA: Diagnosis not present

## 2016-05-07 DIAGNOSIS — M8580 Other specified disorders of bone density and structure, unspecified site: Secondary | ICD-10-CM | POA: Diagnosis not present

## 2016-05-07 DIAGNOSIS — G3184 Mild cognitive impairment, so stated: Secondary | ICD-10-CM | POA: Diagnosis not present

## 2016-05-07 DIAGNOSIS — R4701 Aphasia: Secondary | ICD-10-CM | POA: Diagnosis not present

## 2016-05-07 DIAGNOSIS — R1313 Dysphagia, pharyngeal phase: Secondary | ICD-10-CM | POA: Diagnosis not present

## 2016-05-09 DIAGNOSIS — M810 Age-related osteoporosis without current pathological fracture: Secondary | ICD-10-CM | POA: Diagnosis not present

## 2016-05-09 DIAGNOSIS — F329 Major depressive disorder, single episode, unspecified: Secondary | ICD-10-CM | POA: Diagnosis not present

## 2016-05-09 DIAGNOSIS — I10 Essential (primary) hypertension: Secondary | ICD-10-CM | POA: Diagnosis not present

## 2016-05-09 DIAGNOSIS — Z7982 Long term (current) use of aspirin: Secondary | ICD-10-CM | POA: Diagnosis not present

## 2016-05-09 DIAGNOSIS — G3184 Mild cognitive impairment, so stated: Secondary | ICD-10-CM | POA: Diagnosis not present

## 2016-05-09 DIAGNOSIS — R4701 Aphasia: Secondary | ICD-10-CM | POA: Diagnosis not present

## 2016-05-09 DIAGNOSIS — R1313 Dysphagia, pharyngeal phase: Secondary | ICD-10-CM | POA: Diagnosis not present

## 2016-05-09 DIAGNOSIS — G231 Progressive supranuclear ophthalmoplegia [Steele-Richardson-Olszewski]: Secondary | ICD-10-CM | POA: Diagnosis not present

## 2016-05-09 DIAGNOSIS — M8580 Other specified disorders of bone density and structure, unspecified site: Secondary | ICD-10-CM | POA: Diagnosis not present

## 2016-05-10 DIAGNOSIS — M810 Age-related osteoporosis without current pathological fracture: Secondary | ICD-10-CM | POA: Diagnosis not present

## 2016-05-10 DIAGNOSIS — F329 Major depressive disorder, single episode, unspecified: Secondary | ICD-10-CM | POA: Diagnosis not present

## 2016-05-10 DIAGNOSIS — M8580 Other specified disorders of bone density and structure, unspecified site: Secondary | ICD-10-CM | POA: Diagnosis not present

## 2016-05-10 DIAGNOSIS — I10 Essential (primary) hypertension: Secondary | ICD-10-CM | POA: Diagnosis not present

## 2016-05-10 DIAGNOSIS — G231 Progressive supranuclear ophthalmoplegia [Steele-Richardson-Olszewski]: Secondary | ICD-10-CM | POA: Diagnosis not present

## 2016-05-10 DIAGNOSIS — G3184 Mild cognitive impairment, so stated: Secondary | ICD-10-CM | POA: Diagnosis not present

## 2016-05-10 DIAGNOSIS — R4701 Aphasia: Secondary | ICD-10-CM | POA: Diagnosis not present

## 2016-05-10 DIAGNOSIS — Z7982 Long term (current) use of aspirin: Secondary | ICD-10-CM | POA: Diagnosis not present

## 2016-05-10 DIAGNOSIS — R1313 Dysphagia, pharyngeal phase: Secondary | ICD-10-CM | POA: Diagnosis not present

## 2016-05-15 ENCOUNTER — Telehealth: Payer: Self-pay | Admitting: Neurology

## 2016-05-15 NOTE — Telephone Encounter (Signed)
Left message on machine for patient to call back.  To see how she is doing on Lexapro 10 mg tablets. Awaiting call back to see if she needs to increase to 20 mg tablets.

## 2016-05-15 NOTE — Telephone Encounter (Signed)
Spoke with patient and she states Lexapro is not making any difference. She doesn't understand why she is supposed to take it and I advised it is for mood/depression. She states there is no difference in her mood. I advised her to increase to 20 mg and she does not want to increase medication. She does not want to continue taking medication. She will call if she changes her mind.

## 2016-05-23 ENCOUNTER — Other Ambulatory Visit: Payer: Self-pay | Admitting: Internal Medicine

## 2016-06-03 ENCOUNTER — Other Ambulatory Visit: Payer: Self-pay | Admitting: Neurology

## 2016-06-20 ENCOUNTER — Other Ambulatory Visit: Payer: Self-pay | Admitting: Internal Medicine

## 2016-06-20 DIAGNOSIS — Z1231 Encounter for screening mammogram for malignant neoplasm of breast: Secondary | ICD-10-CM

## 2016-07-10 DIAGNOSIS — H35033 Hypertensive retinopathy, bilateral: Secondary | ICD-10-CM | POA: Diagnosis not present

## 2016-07-10 DIAGNOSIS — H353132 Nonexudative age-related macular degeneration, bilateral, intermediate dry stage: Secondary | ICD-10-CM | POA: Diagnosis not present

## 2016-07-10 DIAGNOSIS — H43393 Other vitreous opacities, bilateral: Secondary | ICD-10-CM | POA: Diagnosis not present

## 2016-07-10 DIAGNOSIS — Z961 Presence of intraocular lens: Secondary | ICD-10-CM | POA: Diagnosis not present

## 2016-07-13 ENCOUNTER — Other Ambulatory Visit: Payer: Self-pay | Admitting: Neurology

## 2016-07-13 ENCOUNTER — Ambulatory Visit
Admission: RE | Admit: 2016-07-13 | Discharge: 2016-07-13 | Disposition: A | Discharge status: Home / Self Care | Payer: Medicare HMO | Source: Ambulatory Visit | Attending: Internal Medicine | Admitting: Internal Medicine

## 2016-07-13 DIAGNOSIS — Z1231 Encounter for screening mammogram for malignant neoplasm of breast: Secondary | ICD-10-CM | POA: Diagnosis not present

## 2016-07-13 MED ORDER — CARBIDOPA-LEVODOPA 25-100 MG PO TABS: ORAL_TABLET | ORAL | 1 refills | Status: DC

## 2016-07-25 NOTE — Progress Notes (Signed)
Tiffany [name] was seen today in the movement disorders clinic for neurologic consultation at the request of Tiffany Cowden, MD.  The patient is seen today in neurologic consultation for the subacute evaluation of gait and speech changes for the last several months.  Looking back, she thinks that her handwriting began to change after Thanksgiving some time.  She remembers it being difficult to write her cards at thanksgiving, as the handwriting was smaller and less fluid.  She thinks that she began to have difficulty with word finding about 2 months ago.  She presented to the emergency room at [facility] in [place] on February 27 and I reviewed those records.  She had an MRI of the brain there and I do not have the films, but I do have the report.  It was nonacute and only revealed mild white matter changes.  She was told to follow up with ENT.  She did not have an ENT consult but she did follow up with her primary care physician on 06/03/2015 and was set up with a speech therapy evaluation, cardiology follow-up and was referred here.  She had a carotid ultrasound on 06/06/2015 which just demonstrated 1-39% stenosis.  I reviewed her cardiology follow-up from 06/09/2015 and they did not think that her new symptoms were related to her aortic stenosis.  Her repeat echocardiogram is pending.  I did get a note from her speech therapist and reviewed their records as well.  It was felt that she had both dysphasia as well as dysphagia and a modified barium swallow as well as traditional speech, occupational and physical therapy are being recommended.  Denies muscles jumping.  Is having trouble swallowing.  Only new medication is Lipitor and she started that a few weeks ago.    07/15/15 update:  The patient follows up today after having multiple tests performed.  She had an MRI of the cervical spine on April, 2017 that demonstrated degenerative changes and central canal stenosis and marked bilateral neural  foraminal stenosis at the C4-C5 level and C5-C6 level and significant left neural foraminal stenosis at the C3-C4 level.  She had an EMG performed that was normal, without evidence of motor neuron disease.  She had a modified barium swallow done on 06/29/2015.  There was moderate pharyngeal phase dysphagia.  Regular diet with thin liquids was recommended.  A chin tuck maneuver was helpful.  Home meds with pure was also recommended.  She had a dat scan done on 07/05/2015 with abnormal significant bilateral reduction and putamen uptake with activity largely confined to the caudate nuclei bilaterally.  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  She has been in physical therapy, speech therapy and occupational therapy consistently sent April 25.  Last visit, I started her on levodopa, 100 mg 3 times per day.  She thinks that she is doing better.    She has had 2 falls since her last visit. With one, she went to the bathroom in the middle of the night and fell backward getting in the bed.  She hurt her L shoulder especially when she lifts it.  Physical therapist did recommend a Rollator and I gave her a prescription for that.  I noted that her primary care physician has given her a handicap placard.  She was supposed to have neuropsych testing done but she didn't call to schedule it when messages were left.  She is not driving and relying on friends for transportation.  Feels that swallowing is better.  Having some monocular diplopia that is vertical.    12/22/15 update:  The patient follows up today.  She is accompanied by her daughter in law who supplements the history.  She is on carbidopa/levodopa 25/100, one tablet 3 times per day (5:30am/11am/5pm).  She does not think that the medication is helping.  No SE with the medication.  She had 1 fall at her daughter in laws house getting out of the bed.  Didn't get hurt.  Family helped her up.  No lightheadedness or near syncope.  No  hallucinations.  She does not drive.  She does continue to have some diplopia.  She had neuropsych testing with Tiffany [name] on 10/26/2015.  There was evidence of mild cognitive impairment and depression.  Pt denies any depression.  There was no evidence of dementia.  Lives alone but staying with her family m-w and then son takes her to church on wed night and daughter in law comes thurs.  She is alone Friday night.  Son comes to her house some Saturday and sundays, her other son takes her to church.  Family concerned about her being alone but pt absolutely refuses to leave home or let someone come in.  No swallowing issues or problems with choking.  Daughter in law states that she has had some choking.  One choking episode in a restaurant on liquid this past week.    03/29/16 update:  Pt is on carbidopa/levodopa 25/100 which was increased last visit to 2 tablets in the morning, one in the afternoon and one in the evening.  [facility] and was following with the patient and I reviewed their notes.  She did go for her therapy screen, but ultimately requested that she have home health instead.  A referral was placed for this just 2 days ago.  She has some coughing episodes and I got a note from [facility] yesterday asking for an order for a repeat MBE.  Coughing is mostly with liquids.  No falls.  Uses walker some of time but admits not all of the time.  Lots of blurry vision but no diplopia but noting change.  She is not driving.  She is preparing her own pill box without trouble.  Notes trouble with putting on pants.  Admits to depression  07/25/16 update: Patient seen today in follow-up, accompanied by her son who supplements the history.  She is on carbidopa/levodopa 25/100, 2 tablets in the morning, one in the afternoon and when the evening.  Last visit, Lexapro, 10 mg was added.  We called her about 6 weeks after we started it and she said it didn't help so we increased it to 20 mg.  States that she d/c lexapro because "it wasn't  doing me any good."  States that she feels good off of it.   she had a modified barium swallow on 04/10/2016.  This demonstrated mild oral phrase and moderate pharyngeal phase dysphagia.  Dysphagia 3 diet with nectar thick liquids was recommended.  Meals on wheels is coming to the house.  Caregiver comes by for an hour 5 days per week to make sure patient is okay.      ALLERGIES:   Allergies  Allergen Reactions  . Codeine Phosphate     CURRENT MEDICATIONS:  Outpatient Encounter Prescriptions as of 07/26/2016  Medication Sig  . aspirin 81 MG tablet Take 81 mg by mouth daily.    Marland Kitchen atorvastatin (LIPITOR) 10 MG tablet TAKE 1 TABLET  EVERY DAY  . benazepril (LOTENSIN) 40 MG tablet TAKE 1 TABLET EVERY DAY  . Calcium Carbonate (CALTRATE 600) 1500 MG TABS Take by mouth 2 (two) times daily.    . carbidopa-levodopa (SINEMET IR) 25-100 MG tablet 2 in AM, 1 in afternoon, 1 in evening  . Cholecalciferol (VITAMIN D) 1000 UNITS capsule Take 1,000 Units by mouth daily.    . hydrochlorothiazide (HYDRODIURIL) 25 MG tablet TAKE 1 TABLET EVERY DAY  . Multiple Vitamin (MULTIVITAMIN) tablet Take 1 tablet by mouth daily.    . Omega-3 Fatty Acids (FISH OIL) 1200 MG CAPS Take 1 capsule by mouth daily.   No facility-administered encounter medications on file as of 07/26/2016.     PAST MEDICAL HISTORY:   Past Medical History:  Diagnosis Date  . ALLERGIC RHINITIS 11/21/2006  . BACK PAIN 12/17/2006  . COLONIC POLYPS, HX OF 11/21/2006  . HYPERTENSION 11/21/2006  . OSTEOPENIA 08/11/2009  . OSTEOPOROSIS 11/21/2006  . PEDAL EDEMA 07/05/2008  . SYSTOLIC MURMUR 9/38/1829    PAST SURGICAL HISTORY:   Past Surgical History:  Procedure Laterality Date  . BREAST EXCISIONAL BIOPSY Right    benign  . BREAST SURGERY     bx  . CATARACT EXTRACTION Bilateral 2012  . HEMORRHOID SURGERY    . TONSILLECTOMY      SOCIAL HISTORY:   Social History   Social History  . Marital status: Single    Spouse name: N/A  . Number of  children: N/A  . Years of education: N/A   Occupational History  . retired     Press photographer   Social History Main Topics  . Smoking status: Former Smoker    Quit date: 03/27/1995  . Smokeless tobacco: Never Used  . Alcohol use No  . Drug use: No  . Sexual activity: Not on file   Other Topics Concern  . Not on file   Social History Narrative  . No narrative on file    FAMILY HISTORY:   Family Status  Relation Status  . Mother Deceased at age 32   ovarian ca  . Father Deceased at age 70   cad  . Sister Alive   3, one with breast CA  . Sister Deceased   48, pancreatic CA, brain tumor  . Brother Alive   1, CAD  . Brother Deceased   97  . Child Alive   2 sons, alive and well    ROS:  A complete 10 system review of systems was obtained and was unremarkable apart from what is mentioned above.  PHYSICAL EXAMINATION:    VITALS:   Vitals:   07/26/16 1537  BP: 140/80  Pulse: 88  SpO2: 98%  Weight: 148 lb (67.1 kg)  Height: 5\' 4"  (1.626 m)    GEN:  The patient appears stated age and is in NAD. HEENT:  Normocephalic, atraumatic.  The mucous membranes are moist. The superficial temporal arteries are without ropiness or tenderness. CV:  RRR with 3/6 SEM that radiates to the right carotid. Lungs:  CTAB but she has some laryngeal stridor Neck/HEME:  There are no carotid bruits bilaterally.  Neurological examination:  Orientation: The patient is alert and oriented x3.  Cranial nerves: There is good facial symmetry. She has facial hypomimia.  She has complete downgaze and upgaze paresis.  She has significant square wave jerks.  The visual fields are full to confrontational testing. The speech is nonfluent but it is fairly clear. She has intermittent grunting.  The soft palate  rises symmetrically and there is no tongue deviation. Hearing is intact to conversational tone. Sensation: Sensation is intact to light touch throughout Motor: Strength is 5/5 in the bilateral upper and lower  extremities.   Tongue strength is good.  Shoulder shrug is equal and symmetric.  There is no pronator drift.  No fasciculations noted anywhere, including the tongue.   Movement examination: Tone: There is no rigidity Abnormal movements: no dyskinesia today Coordination:  There is good RAMs today Gait and Station: The patient has mild difficulty arising out of a deep-seated chair without the use of the hands. She walks well with the walker  Labs:    Chemistry      Component Value Date/Time   NA 140 06/13/2015 1404   K 4.3 06/13/2015 1404   CL 99 06/13/2015 1404   CO2 30 06/13/2015 1404   BUN 16 06/13/2015 1404   CREATININE 1.10 (H) 06/13/2015 1404      Component Value Date/Time   CALCIUM 10.2 06/13/2015 1404   CALCIUM 10.2 06/13/2015 1404   ALKPHOS 63 06/13/2015 1404   AST 18 06/13/2015 1404   ALT 15 06/13/2015 1404   BILITOT 0.6 06/13/2015 1404     Lab Results  Component Value Date   TSH 1.35 06/13/2015      ASSESSMENT/PLAN:  1.  PSP  -She had a dat scan done on 07/05/2015 with abnormal significant bilateral reduction and putamen uptake with activity largely confined to the caudate nuclei bilaterally.  - did well with PT  -Continue carbidopa/levodopa 25/100 to 2/1/1.  Clinically looks better although pt not sure that it helps  -talked to her again about living alone again.  Son and patient think managing well in home.  Talked extensively about home safety.  2.  Depression  -pt didn't think lexapro helped and d/c it.  Feels good off of it. 3.  HTN  -may not need as much med or med at all.  Will ask PCP to keep an eye on this.  Pt already noting a drop in BP 4.  Mild cognitive impairment  -She had neuropsych testing with Tiffany [name] on 10/26/2015.  There was evidence of mild cognitive impairment and depression.  There was no evidence of dementia.  -Concerned about living alone, primarily because of physical status, but also mental status. 5.  Dysphagia  she had a  modified barium swallow on 04/10/2016.  This demonstrated mild oral phrase and moderate pharyngeal phase dysphagia.  Dysphagia 3 diet with nectar thick liquids was recommended.  Pt following this diet 6.  Follow up is anticipated in the next few months, sooner should new neurologic issues arise.  Much greater than 50% of this visit was spent in counseling and coordinating care.  Total face to face time:  40 min

## 2016-07-26 ENCOUNTER — Encounter: Payer: Self-pay | Admitting: Neurology

## 2016-07-26 ENCOUNTER — Ambulatory Visit (INDEPENDENT_AMBULATORY_CARE_PROVIDER_SITE_OTHER): Payer: Medicare HMO | Admitting: Neurology

## 2016-07-26 VITALS — BP 140/80 | HR 88 | Ht 64.0 in | Wt 148.0 lb

## 2016-07-26 DIAGNOSIS — G231 Progressive supranuclear ophthalmoplegia [Steele-Richardson-Olszewski]: Secondary | ICD-10-CM | POA: Diagnosis not present

## 2016-07-26 DIAGNOSIS — R1319 Other dysphagia: Secondary | ICD-10-CM | POA: Insufficient documentation

## 2016-09-03 ENCOUNTER — Ambulatory Visit (INDEPENDENT_AMBULATORY_CARE_PROVIDER_SITE_OTHER): Payer: Medicare HMO | Admitting: Internal Medicine

## 2016-09-03 ENCOUNTER — Encounter: Payer: Self-pay | Admitting: Internal Medicine

## 2016-09-03 VITALS — BP 120/76 | HR 84 | Temp 97.6°F | Ht 64.0 in | Wt 144.4 lb

## 2016-09-03 DIAGNOSIS — I350 Nonrheumatic aortic (valve) stenosis: Secondary | ICD-10-CM

## 2016-09-03 DIAGNOSIS — I10 Essential (primary) hypertension: Secondary | ICD-10-CM

## 2016-09-03 DIAGNOSIS — G231 Progressive supranuclear ophthalmoplegia [Steele-Richardson-Olszewski]: Secondary | ICD-10-CM | POA: Diagnosis not present

## 2016-09-03 NOTE — Progress Notes (Signed)
Subjective:    Patient ID: Tiffany [name], female    DOB: Apr 27, 1932, 83 y.o.   MRN: [id number]  HPI  83 year old patient who is followed closely by neurology due to PSP.  She has a history of moderate left ear which has been stable.  Her activity is limited due to gait abnormality, but denies any chest pain or shortness of breath.  She has essential hypertension No new concerns or complaints She is accompanied by a friend/helper today.  Still lives alone with assistance and meals on wheels.  Does have a son who lives close by  Needs follow-up 2-D echocardiogram  Past Medical History:  Diagnosis Date  . ALLERGIC RHINITIS 11/21/2006  . BACK PAIN 12/17/2006  . COLONIC POLYPS, HX OF 11/21/2006  . HYPERTENSION 11/21/2006  . OSTEOPENIA 08/11/2009  . OSTEOPOROSIS 11/21/2006  . PEDAL EDEMA 07/05/2008  . SYSTOLIC MURMUR 2/63/3354     Social History   Social History  . Marital status: Single    Spouse name: N/A  . Number of children: N/A  . Years of education: N/A   Occupational History  . retired     Press photographer   Social History Main Topics  . Smoking status: Former Smoker    Quit date: 03/27/1995  . Smokeless tobacco: Never Used  . Alcohol use No  . Drug use: No  . Sexual activity: Not on file   Other Topics Concern  . Not on file   Social History Narrative  . No narrative on file    Past Surgical History:  Procedure Laterality Date  . BREAST EXCISIONAL BIOPSY Right    benign  . BREAST SURGERY     bx  . CATARACT EXTRACTION Bilateral 2012  . HEMORRHOID SURGERY    . TONSILLECTOMY      No family history on file.  Allergies  Allergen Reactions  . Codeine Phosphate     Current Outpatient Prescriptions on File Prior to Visit  Medication Sig Dispense Refill  . aspirin 81 MG tablet Take 81 mg by mouth daily.      Marland Kitchen atorvastatin (LIPITOR) 10 MG tablet TAKE 1 TABLET EVERY DAY 90 tablet 3  . benazepril (LOTENSIN) 40 MG tablet TAKE 1 TABLET EVERY DAY 90 tablet 1  . Calcium  Carbonate (CALTRATE 600) 1500 MG TABS Take by mouth 2 (two) times daily.      . carbidopa-levodopa (SINEMET IR) 25-100 MG tablet 2 in AM, 1 in afternoon, 1 in evening 360 tablet 1  . Cholecalciferol (VITAMIN D) 1000 UNITS capsule Take 1,000 Units by mouth daily.      . hydrochlorothiazide (HYDRODIURIL) 25 MG tablet TAKE 1 TABLET EVERY DAY 90 tablet 1  . Multiple Vitamin (MULTIVITAMIN) tablet Take 1 tablet by mouth daily.      . Omega-3 Fatty Acids (FISH OIL) 1200 MG CAPS Take 1 capsule by mouth daily.     No current facility-administered medications on file prior to visit.     BP 120/76 (BP Location: Left Arm, Patient Position: Sitting, Cuff Size: Normal)   Pulse 84   Temp 97.6 F (36.4 C) (Oral)   Ht 5\' 4"  (1.626 m)   Wt 144 lb 6.4 oz (65.5 kg)   SpO2 96%   BMI 24.79 kg/m     Review of Systems  Constitutional: Negative.   HENT: Negative for congestion, dental problem, hearing loss, rhinorrhea, sinus pressure, sore throat and tinnitus.   Eyes: Negative for pain, discharge and visual disturbance.  Respiratory: Negative for  cough and shortness of breath.   Cardiovascular: Negative for chest pain, palpitations and leg swelling.  Gastrointestinal: Negative for abdominal distention, abdominal pain, blood in stool, constipation, diarrhea, nausea and vomiting.  Genitourinary: Negative for difficulty urinating, dysuria, flank pain, frequency, hematuria, pelvic pain, urgency, vaginal bleeding, vaginal discharge and vaginal pain.  Musculoskeletal: Positive for gait problem. Negative for arthralgias and joint swelling.  Skin: Negative for rash.  Neurological: Positive for dizziness and speech difficulty. Negative for syncope, weakness, numbness and headaches.  Hematological: Negative for adenopathy.  Psychiatric/Behavioral: Negative for agitation, behavioral problems and dysphoric mood. The patient is not nervous/anxious.        Objective:   Physical Exam  Constitutional: She is oriented  to person, place, and time. She appears well-developed and well-nourished.  HENT:  Head: Normocephalic.  Right Ear: External ear normal.  Left Ear: External ear normal.  Mouth/Throat: Oropharynx is clear and moist.  Eyes: Conjunctivae and EOM are normal. Pupils are equal, round, and reactive to light.  Neck: Normal range of motion. Neck supple. No thyromegaly present.  Cardiovascular: Normal rate, regular rhythm, normal heart sounds and intact distal pulses.   Grade 5-9-9 systolic murmur loudest at the primary aortic area with radiation to the carotid distribution  Pulmonary/Chest: Effort normal and breath sounds normal.  Abdominal: Soft. Bowel sounds are normal. She exhibits no mass. There is no tenderness.  Musculoskeletal: Normal range of motion.  Lymphadenopathy:    She has no cervical adenopathy.  Neurological: She is alert and oriented to person, place, and time.  Speech impairment Unsteady Requires assistance with ambulation and transfers  Skin: Skin is warm and dry. No rash noted.  Psychiatric: She has a normal mood and affect. Her behavior is normal.          Assessment & Plan:   Moderate aortic stenosis.  Will schedule follow-up 2-D echocardiogram PSP.  Follow-up neurology.  No change in regimen Essential hypertension, stable Dyslipidemia.  Continue statin therapy  Follow-up 6 months  Juliene Kirsh Pilar Plate

## 2016-09-03 NOTE — Progress Notes (Signed)
   Subjective:    Patient ID: Tiffany [name], female    DOB: 03-Nov-1932, 83 y.o.   MRN: [id number]  HPI    Review of Systems     Objective:   Physical Exam        Assessment & Plan:

## 2016-09-03 NOTE — Patient Instructions (Addendum)
WE NOW OFFER   [facility] FAST TRACK!!!  SAME DAY Appointments for ACUTE CARE  Such as: Sprains, Injuries, cuts, abrasions, rashes, muscle pain, joint pain, back pain Colds, flu, sore throats, headache, allergies, cough, fever  Ear pain, sinus and eye infections Abdominal pain, nausea, vomiting, diarrhea, upset stomach Animal/insect bites  3 Easy Ways to Schedule: Walk-In Scheduling Call in scheduling Mychart Sign-up: [email or web address]   Limit your sodium (Salt) intake  Please check your blood pressure on a regular basis.  If it is consistently greater than 150/90, please make an office appointment.    2-D echocardiogram as discussed

## 2016-09-14 ENCOUNTER — Other Ambulatory Visit: Payer: Self-pay | Admitting: Internal Medicine

## 2016-10-08 ENCOUNTER — Other Ambulatory Visit (HOSPITAL_COMMUNITY): Payer: Medicare HMO

## 2016-11-26 ENCOUNTER — Other Ambulatory Visit: Payer: Self-pay | Admitting: Internal Medicine

## 2016-11-28 NOTE — Progress Notes (Signed)
Tiffany [name] was seen today in the movement disorders clinic for neurologic consultation at the request of Marletta Lor, MD.  The patient is seen today in neurologic consultation for the subacute evaluation of gait and speech changes for the last several months.  Looking back, she thinks that her handwriting began to change after Thanksgiving some time.  She remembers it being difficult to write her cards at thanksgiving, as the handwriting was smaller and less fluid.  She thinks that she began to have difficulty with word finding about 2 months ago.  She presented to the emergency room at [facility] in [place] on February 27 and I reviewed those records.  She had an MRI of the brain there and I do not have the films, but I do have the report.  It was nonacute and only revealed mild white matter changes.  She was told to follow up with ENT.  She did not have an ENT consult but she did follow up with her primary care physician on 06/03/2015 and was set up with a speech therapy evaluation, cardiology follow-up and was referred here.  She had a carotid ultrasound on 06/06/2015 which just demonstrated 1-39% stenosis.  I reviewed her cardiology follow-up from 06/09/2015 and they did not think that her new symptoms were related to her aortic stenosis.  Her repeat echocardiogram is pending.  I did get a note from her speech therapist and reviewed their records as well.  It was felt that she had both dysphasia as well as dysphagia and a modified barium swallow as well as traditional speech, occupational and physical therapy are being recommended.  Denies muscles jumping.  Is having trouble swallowing.  Only new medication is Lipitor and she started that a few weeks ago.    07/15/15 update:  The patient follows up today after having multiple tests performed.  She had an MRI of the cervical spine on April, 2017 that demonstrated degenerative changes and central canal stenosis and marked bilateral neural  foraminal stenosis at the C4-C5 level and C5-C6 level and significant left neural foraminal stenosis at the C3-C4 level.  She had an EMG performed that was normal, without evidence of motor neuron disease.  She had a modified barium swallow done on 06/29/2015.  There was moderate pharyngeal phase dysphagia.  Regular diet with thin liquids was recommended.  A chin tuck maneuver was helpful.  Home meds with pure was also recommended.  She had a dat scan done on 07/05/2015 with abnormal significant bilateral reduction and putamen uptake with activity largely confined to the caudate nuclei bilaterally.  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  She has been in physical therapy, speech therapy and occupational therapy consistently sent April 25.  Last visit, I started her on levodopa, 100 mg 3 times per day.  She thinks that she is doing better.    She has had 2 falls since her last visit. With one, she went to the bathroom in the middle of the night and fell backward getting in the bed.  She hurt her L shoulder especially when she lifts it.  Physical therapist did recommend a Rollator and I gave her a prescription for that.  I noted that her primary care physician has given her a handicap placard.  She was supposed to have neuropsych testing done but she didn't call to schedule it when messages were left.  She is not driving and relying on friends for transportation.  Feels that swallowing is better.  Having some monocular diplopia that is vertical.    12/22/15 update:  The patient follows up today.  She is accompanied by her daughter in law who supplements the history.  She is on carbidopa/levodopa 25/100, one tablet 3 times per day (5:30am/11am/5pm).  She does not think that the medication is helping.  No SE with the medication.  She had 1 fall at her daughter in laws house getting out of the bed.  Didn't get hurt.  Family helped her up.  No lightheadedness or near syncope.  No  hallucinations.  She does not drive.  She does continue to have some diplopia.  She had neuropsych testing with Dr. Si Raider on 10/26/2015.  There was evidence of mild cognitive impairment and depression.  Pt denies any depression.  There was no evidence of dementia.  Lives alone but staying with her family m-w and then son takes her to church on wed night and daughter in law comes thurs.  She is alone Friday night.  Son comes to her house some Saturday and sundays, her other son takes her to church.  Family concerned about her being alone but pt absolutely refuses to leave home or let someone come in.  No swallowing issues or problems with choking.  Daughter in law states that she has had some choking.  One choking episode in a restaurant on liquid this past week.    03/29/16 update:  Pt is on carbidopa/levodopa 25/100 which was increased last visit to 2 tablets in the morning, one in the afternoon and one in the evening.  [facility] and was following with the patient and I reviewed their notes.  She did go for her therapy screen, but ultimately requested that she have home health instead.  A referral was placed for this just 2 days ago.  She has some coughing episodes and I got a note from [place] yesterday asking for an order for a repeat MBE.  Coughing is mostly with liquids.  No falls.  Uses walker some of time but admits not all of the time.  Lots of blurry vision but no diplopia but noting change.  She is not driving.  She is preparing her own pill box without trouble.  Notes trouble with putting on pants.  Admits to depression  07/25/16 update: Patient seen today in follow-up, accompanied by her son who supplements the history.  She is on carbidopa/levodopa 25/100, 2 tablets in the morning, one in the afternoon and when the evening.  Last visit, Lexapro, 10 mg was added.  We called her about 6 weeks after we started it and she said it didn't help so we increased it to 20 mg.  States that she d/c lexapro because "it wasn't  doing me any good."  States that she feels good off of it.   she had a modified barium swallow on 04/10/2016.  This demonstrated mild oral phrase and moderate pharyngeal phase dysphagia.  Dysphagia 3 diet with nectar thick liquids was recommended.  Meals on wheels is coming to the house.  Caregiver comes by for an hour 5 days per week to make sure patient is okay.     11/29/16 update:  Patient seen today in follow-up for her progressive supranuclear palsy.  She is accompanied by her son who supplements the history.  She remains on carbidopa/levodopa 25/100, 2 tablets in the morning, one in the afternoon and one in the evening.    She denies choking.  She  did have a swallowing study earlier this year which recommended dysphagia 3 diet with nectar thick liquids.  She denies following this.  She does have Meals on Wheels.  She lives alone.  She has a caregiver 5 days per week, but only for an hour per day.  She thinks that she is doing well with this level of caregiving.  Her son thinks that perhaps it is time for more caregiving.  Reports several falls since last visit.  With one fall she had the walker and fell backward.  With the others, she didn't have the walker   ALLERGIES:   Allergies  Allergen Reactions  . Codeine Phosphate     CURRENT MEDICATIONS:  Outpatient Encounter Prescriptions as of 11/29/2016  Medication Sig  . amLODipine (NORVASC) 2.5 MG tablet TAKE 1 TABLET EVERY DAY  . aspirin 81 MG tablet Take 81 mg by mouth daily.    Marland Kitchen atorvastatin (LIPITOR) 10 MG tablet TAKE 1 TABLET EVERY DAY  . benazepril (LOTENSIN) 40 MG tablet TAKE 1 TABLET EVERY DAY  . Calcium Carbonate (CALTRATE 600) 1500 MG TABS Take by mouth 2 (two) times daily.    . carbidopa-levodopa (SINEMET IR) 25-100 MG tablet 2 in AM, 1 in afternoon, 1 in evening  . Cholecalciferol (VITAMIN D) 1000 UNITS capsule Take 1,000 Units by mouth daily.    . hydrochlorothiazide (HYDRODIURIL) 25 MG tablet TAKE 1 TABLET EVERY DAY  . Multiple  Vitamin (MULTIVITAMIN) tablet Take 1 tablet by mouth daily.    . Omega-3 Fatty Acids (FISH OIL) 1200 MG CAPS Take 1 capsule by mouth daily.   No facility-administered encounter medications on file as of 11/29/2016.     PAST MEDICAL HISTORY:   Past Medical History:  Diagnosis Date  . ALLERGIC RHINITIS 11/21/2006  . BACK PAIN 12/17/2006  . COLONIC POLYPS, HX OF 11/21/2006  . HYPERTENSION 11/21/2006  . OSTEOPENIA 08/11/2009  . OSTEOPOROSIS 11/21/2006  . PEDAL EDEMA 07/05/2008  . SYSTOLIC MURMUR 3/87/5643    PAST SURGICAL HISTORY:   Past Surgical History:  Procedure Laterality Date  . BREAST EXCISIONAL BIOPSY Right    benign  . BREAST SURGERY     bx  . CATARACT EXTRACTION Bilateral 2012  . HEMORRHOID SURGERY    . TONSILLECTOMY      SOCIAL HISTORY:   Social History   Social History  . Marital status: Single    Spouse name: N/A  . Number of children: N/A  . Years of education: N/A   Occupational History  . retired     Press photographer   Social History Main Topics  . Smoking status: Former Smoker    Quit date: 03/27/1995  . Smokeless tobacco: Never Used  . Alcohol use No  . Drug use: No  . Sexual activity: Not on file   Other Topics Concern  . Not on file   Social History Narrative  . No narrative on file    FAMILY HISTORY:   Family Status  Relation Status  . Mother Deceased at age 65       ovarian ca  . Father Deceased at age 46       cad  . Sister Alive       3, one with breast CA  . Sister Deceased       29, pancreatic CA, brain tumor  . Brother Alive       1, CAD  . Brother Deceased       15  . Child Alive  2 sons, alive and well    ROS:  A complete 10 system review of systems was obtained and was unremarkable apart from what is mentioned above.  PHYSICAL EXAMINATION:    VITALS:   Vitals:   11/29/16 1532  BP: (!) 148/80  Pulse: 82  SpO2: 93%  Height: 5\' 4"  (1.626 m)    GEN:  The patient appears stated age and is in NAD. HEENT:  Normocephalic,  atraumatic.  The mucous membranes are moist. The superficial temporal arteries are without ropiness or tenderness. CV:  RRR with 3/6 SEM that radiates to the right carotid. Lungs:  CTAB but she has some laryngeal stridor Neck/HEME:  There are no carotid bruits bilaterally.  Neurological examination:  Orientation: The patient is alert and oriented x3.  Cranial nerves: There is good facial symmetry. She has facial hypomimia.  She has complete downgaze and upgaze paresis.  She has significant square wave jerks.  The visual fields are full to confrontational testing. The speech is nonfluent and it is somewhat stuttering in quality. She has intermittent grunting.  The soft palate rises symmetrically and there is no tongue deviation. Hearing is intact to conversational tone. Sensation: Sensation is intact to light touch throughout Motor: Strength is 5/5 in the bilateral upper and lower extremities.   Tongue strength is good.  Shoulder shrug is equal and symmetric.  There is no pronator drift.  No fasciculations noted anywhere, including the tongue.   Movement examination: Tone: There is no rigidity Abnormal movements: no dyskinesia today Coordination:  There is good RAMs today Gait and Station: The patient pushes off of the chair to arise.  Once up, she is mildly unsteady until she holds onto the walker and then she walks very well with it.  Labs:    Chemistry      Component Value Date/Time   NA 140 06/13/2015 1404   K 4.3 06/13/2015 1404   CL 99 06/13/2015 1404   CO2 30 06/13/2015 1404   BUN 16 06/13/2015 1404   CREATININE 1.10 (H) 06/13/2015 1404      Component Value Date/Time   CALCIUM 10.2 06/13/2015 1404   CALCIUM 10.2 06/13/2015 1404   ALKPHOS 63 06/13/2015 1404   AST 18 06/13/2015 1404   ALT 15 06/13/2015 1404   BILITOT 0.6 06/13/2015 1404     Lab Results  Component Value Date   TSH 1.35 06/13/2015      ASSESSMENT/PLAN:  1.  PSP  -She had a dat scan done on  07/05/2015 with abnormal significant bilateral reduction and putamen uptake with activity largely confined to the caudate nuclei bilaterally.  -Continue carbidopa/levodopa 25/100 2/1/1.  Clinically looks better although pt not sure that it helps  -Discussed living situation again.  The patient thinks that she is still doing good in this situation.  This likely will not be sustainable long-term.  I spent most of the visit discussing this issue with her.  Her son agrees with me.  I told her I needed her to discuss long-term sustainability with her family and what her wishes are should she need 8 hour per day care versus 10 hour per day care versus 24-hour per day care.  I told her that it is no longer an option to say that she would just want to stay independently at home as her caregiving needs really are growing.  She was agreeable to talk this over with her son in more detail.  I told them that we will get a  new Parkinson Education officer, museum next week and date certainly can discuss this with her in the near future, but they will need to discuss their personal finances and their ability to afford care as a family. 2.  Depression  -Currently off of her Lexapro and thinks that she is doing well.  She will let me know if she feels differently. 3.  Mild cognitive impairment  -She had neuropsych testing with Dr. Si Raider on 10/26/2015.  There was evidence of mild cognitive impairment and depression.  There was no evidence of dementia.  -Concerned about living alone, primarily because of physical status, but also mental status. 4.  Dysphagia  she had a modified barium swallow on 04/10/2016.  This demonstrated mild oral phrase and moderate pharyngeal phase dysphagia.  Dysphagia 3 diet with nectar thick liquids was recommended.   5.  Follow up is anticipated in the next few months, sooner should new neurologic issues arise.  Much greater than 50% of this visit was spent in counseling and coordinating care.  Total face to  face time:  45 min

## 2016-11-29 ENCOUNTER — Ambulatory Visit (INDEPENDENT_AMBULATORY_CARE_PROVIDER_SITE_OTHER): Payer: Medicare HMO | Admitting: Neurology

## 2016-11-29 ENCOUNTER — Encounter: Payer: Self-pay | Admitting: Neurology

## 2016-11-29 VITALS — BP 148/80 | HR 82 | Ht 64.0 in

## 2016-11-29 DIAGNOSIS — R1319 Other dysphagia: Secondary | ICD-10-CM | POA: Diagnosis not present

## 2016-11-29 DIAGNOSIS — G231 Progressive supranuclear ophthalmoplegia [Steele-Richardson-Olszewski]: Secondary | ICD-10-CM

## 2016-12-17 ENCOUNTER — Telehealth: Payer: Self-pay | Admitting: Psychology

## 2016-12-17 NOTE — Telephone Encounter (Signed)
I contacted Ms. Tiffany [name] to introduce myself as the new clinic Education officer, museum. I shared that I would like to make an appointment with her  And her son to see if I can help provide resources that may be needed now or in the future. She gave me permission to contact her son. I contacted him and left a message indicating that I am more than willing to meet with him and his mother to discuss any anticipated needs and provide information on resources.

## 2017-01-09 ENCOUNTER — Other Ambulatory Visit: Payer: Self-pay | Admitting: Internal Medicine

## 2017-01-12 ENCOUNTER — Other Ambulatory Visit: Payer: Self-pay | Admitting: Neurology

## 2017-01-31 DIAGNOSIS — H04123 Dry eye syndrome of bilateral lacrimal glands: Secondary | ICD-10-CM | POA: Diagnosis not present

## 2017-01-31 DIAGNOSIS — H353131 Nonexudative age-related macular degeneration, bilateral, early dry stage: Secondary | ICD-10-CM | POA: Diagnosis not present

## 2017-01-31 DIAGNOSIS — H26491 Other secondary cataract, right eye: Secondary | ICD-10-CM | POA: Diagnosis not present

## 2017-01-31 DIAGNOSIS — Z961 Presence of intraocular lens: Secondary | ICD-10-CM | POA: Diagnosis not present

## 2017-02-11 ENCOUNTER — Other Ambulatory Visit: Payer: Self-pay | Admitting: Neurology

## 2017-03-05 ENCOUNTER — Ambulatory Visit: Payer: Medicare HMO | Admitting: Internal Medicine

## 2017-03-14 ENCOUNTER — Ambulatory Visit: Payer: Medicare HMO | Admitting: Internal Medicine

## 2017-03-28 ENCOUNTER — Encounter: Payer: Self-pay | Admitting: Internal Medicine

## 2017-03-28 ENCOUNTER — Ambulatory Visit (INDEPENDENT_AMBULATORY_CARE_PROVIDER_SITE_OTHER): Payer: Medicare HMO | Admitting: Internal Medicine

## 2017-03-28 VITALS — BP 124/62 | HR 74 | Temp 98.2°F | Ht 64.0 in | Wt 137.4 lb

## 2017-03-28 DIAGNOSIS — I350 Nonrheumatic aortic (valve) stenosis: Secondary | ICD-10-CM | POA: Diagnosis not present

## 2017-03-28 DIAGNOSIS — R1319 Other dysphagia: Secondary | ICD-10-CM | POA: Diagnosis not present

## 2017-03-28 DIAGNOSIS — I10 Essential (primary) hypertension: Secondary | ICD-10-CM

## 2017-03-28 DIAGNOSIS — G231 Progressive supranuclear ophthalmoplegia [Steele-Richardson-Olszewski]: Secondary | ICD-10-CM | POA: Diagnosis not present

## 2017-03-28 DIAGNOSIS — E785 Hyperlipidemia, unspecified: Secondary | ICD-10-CM | POA: Diagnosis not present

## 2017-03-28 DIAGNOSIS — Z86010 Personal history of colonic polyps: Secondary | ICD-10-CM

## 2017-03-28 NOTE — Progress Notes (Signed)
Subjective:    Patient ID: Tiffany [name], female    DOB: 07-10-1932, 84 y.o.   MRN: [id number]  HPI 84 year old patient who is seen today for follow-up.  She is followed closely by neurology with a history of PSP. She has essential hypertension and also history of aortic stenosis. She has a history of gait abnormality and dysphagia secondary to PSP.  She has a Actuary from 8 PM to 8 AM but basically is unattended through most of the day.  She does have family close by  No new concerns or complaints.  No recent falls  Past Medical History:  Diagnosis Date  . ALLERGIC RHINITIS 11/21/2006  . BACK PAIN 12/17/2006  . COLONIC POLYPS, HX OF 11/21/2006  . HYPERTENSION 11/21/2006  . OSTEOPENIA 08/11/2009  . OSTEOPOROSIS 11/21/2006  . PEDAL EDEMA 07/05/2008  . SYSTOLIC MURMUR 9/37/1696     Social History   Socioeconomic History  . Marital status: Single    Spouse name: Not on file  . Number of children: Not on file  . Years of education: Not on file  . Highest education level: Not on file  Social Needs  . Financial resource strain: Not on file  . Food insecurity - worry: Not on file  . Food insecurity - inability: Not on file  . Transportation needs - medical: Not on file  . Transportation needs - non-medical: Not on file  Occupational History  . Occupation: retired    Comment: Press photographer  Tobacco Use  . Smoking status: Former Smoker    Last attempt to quit: 03/27/1995    Years since quitting: 22.0  . Smokeless tobacco: Never Used  Substance and Sexual Activity  . Alcohol use: No    Alcohol/week: 0.0 oz  . Drug use: No  . Sexual activity: Not on file  Other Topics Concern  . Not on file  Social History Narrative  . Not on file    Past Surgical History:  Procedure Laterality Date  . BREAST EXCISIONAL BIOPSY Right    benign  . BREAST SURGERY     bx  . CATARACT EXTRACTION Bilateral 2012  . HEMORRHOID SURGERY    . TONSILLECTOMY      History reviewed. No pertinent family  history.  Allergies  Allergen Reactions  . Codeine Phosphate     Current Outpatient Medications on File Prior to Visit  Medication Sig Dispense Refill  . amLODipine (NORVASC) 2.5 MG tablet TAKE 1 TABLET EVERY DAY 90 tablet 3  . aspirin 81 MG tablet Take 81 mg by mouth daily.      Marland Kitchen atorvastatin (LIPITOR) 10 MG tablet TAKE 1 TABLET EVERY DAY 90 tablet 3  . benazepril (LOTENSIN) 40 MG tablet TAKE 1 TABLET EVERY DAY 90 tablet 1  . Calcium Carbonate (CALTRATE 600) 1500 MG TABS Take by mouth 2 (two) times daily.      . carbidopa-levodopa (SINEMET IR) 25-100 MG tablet TAKE 2 TABLETS IN THE MORNING  , TAKE 1 TABLET  IN  THE  AFTERNOON  AND TAKE 1 TABLET IN THE EVENING 360 tablet 1  . Cholecalciferol (VITAMIN D) 1000 UNITS capsule Take 1,000 Units by mouth daily.      . hydrochlorothiazide (HYDRODIURIL) 25 MG tablet TAKE 1 TABLET EVERY DAY 90 tablet 1  . Multiple Vitamin (MULTIVITAMIN) tablet Take 1 tablet by mouth daily.      . Omega-3 Fatty Acids (FISH OIL) 1200 MG CAPS Take 1 capsule by mouth daily.     No  current facility-administered medications on file prior to visit.     BP 124/62 (BP Location: Left Arm, Patient Position: Sitting, Cuff Size: Normal)   Pulse 74   Temp 98.2 F (36.8 C) (Oral)   Ht 5\' 4"  (1.626 m)   Wt 137 lb 6.4 oz (62.3 kg)   SpO2 93%   BMI 23.58 kg/m      Review of Systems  HENT: Negative for congestion, dental problem, hearing loss, rhinorrhea, sinus pressure, sore throat and tinnitus.   Eyes: Negative for pain, discharge and visual disturbance.  Respiratory: Negative for cough and shortness of breath.   Cardiovascular: Negative for chest pain, palpitations and leg swelling.  Gastrointestinal: Negative for abdominal distention, abdominal pain, blood in stool, constipation, diarrhea, nausea and vomiting.  Genitourinary: Negative for difficulty urinating, dysuria, flank pain, frequency, hematuria, pelvic pain, urgency, vaginal bleeding, vaginal discharge and  vaginal pain.  Musculoskeletal: Positive for gait problem. Negative for arthralgias and joint swelling.  Skin: Negative for rash.  Neurological: Positive for speech difficulty and weakness. Negative for dizziness, syncope, numbness and headaches.  Hematological: Negative for adenopathy.  Psychiatric/Behavioral: Negative for agitation, behavioral problems and dysphoric mood. The patient is not nervous/anxious.        Objective:   Physical Exam  Constitutional: She is oriented to person, place, and time. She appears well-developed and well-nourished.  HENT:  Head: Normocephalic.  Right Ear: External ear normal.  Left Ear: External ear normal.  Mouth/Throat: Oropharynx is clear and moist.  Eyes: Conjunctivae and EOM are normal. Pupils are equal, round, and reactive to light.  Neck: Normal range of motion. Neck supple. No thyromegaly present.  Cardiovascular: Normal rate, regular rhythm and intact distal pulses.  Murmur heard. Grade 2-6/2 systolic murmur loudest at the primary aortic area  Pulmonary/Chest: Effort normal and breath sounds normal.  Abdominal: Soft. Bowel sounds are normal. She exhibits no mass. There is no tenderness.  Musculoskeletal: Normal range of motion.  Lymphadenopathy:    She has no cervical adenopathy.  Neurological: She is alert and oriented to person, place, and time.  Alert Slow slightly stuttering nonfluent speech  Unsteady gait.  Requires one person's assistance to ambulate  Skin: Skin is warm and dry. No rash noted.  Psychiatric: She has a normal mood and affect. Her behavior is normal.          Assessment & Plan:   Essential hypertension stable Aortic stenosis.  We will schedule follow-up 2D echocardiogram PSP.  Follow-up neurology  Will check updated lab Follow-up 6 months or as needed  Nyoka Cowden

## 2017-03-28 NOTE — Patient Instructions (Signed)
Limit your sodium (Salt) intake  Echocardiogram as discussed  Neurology follow-up as scheduled  Return in 6 months for follow-up or as needed

## 2017-03-29 ENCOUNTER — Telehealth (HOSPITAL_COMMUNITY): Payer: Self-pay | Admitting: Internal Medicine

## 2017-03-29 LAB — LIPID PANEL
CHOLESTEROL: 135 mg/dL (ref 0–200)
HDL: 63.1 mg/dL (ref >39.00)
LDL CALC: 46 mg/dL (ref 0–99)
NonHDL: 72.04
TRIGLYCERIDES: 129 mg/dL (ref 0.0–149.0)
Total CHOL/HDL Ratio: 2
VLDL: 25.8 mg/dL (ref 0.0–40.0)

## 2017-03-29 LAB — COMPREHENSIVE METABOLIC PANEL
ALBUMIN: 4.3 g/dL (ref 3.5–5.2)
ALK PHOS: 80 U/L (ref 39–117)
ALT: 8 U/L (ref 0–35)
AST: 16 U/L (ref 0–37)
BUN: 23 mg/dL (ref 6–23)
CALCIUM: 9.8 mg/dL (ref 8.4–10.5)
CHLORIDE: 100 meq/L (ref 96–112)
CO2: 34 mEq/L — ABNORMAL HIGH (ref 19–32)
Creatinine, Ser: 1.16 mg/dL (ref 0.40–1.20)
GFR: 47.46 mL/min — ABNORMAL LOW (ref >60.00)
Glucose, Bld: 113 mg/dL — ABNORMAL HIGH (ref 70–99)
POTASSIUM: 3.8 meq/L (ref 3.5–5.1)
Sodium: 142 mEq/L (ref 135–145)
Total Bilirubin: 0.5 mg/dL (ref 0.2–1.2)
Total Protein: 7 g/dL (ref 6.0–8.3)

## 2017-03-29 LAB — CBC WITH DIFFERENTIAL/PLATELET
Basophils Absolute: 0.1 10*3/uL (ref 0.0–0.1)
Basophils Relative: 0.5 % (ref 0.0–3.0)
EOS PCT: 1.4 % (ref 0.0–5.0)
Eosinophils Absolute: 0.1 10*3/uL (ref 0.0–0.7)
HEMATOCRIT: 40.5 % (ref 36.0–46.0)
HEMOGLOBIN: 13.7 g/dL (ref 12.0–15.0)
LYMPHS PCT: 18.2 % (ref 12.0–46.0)
Lymphs Abs: 1.7 10*3/uL (ref 0.7–4.0)
MCHC: 33.8 g/dL (ref 30.0–36.0)
MCV: 90.4 fl (ref 78.0–100.0)
MONO ABS: 0.6 10*3/uL (ref 0.1–1.0)
MONOS PCT: 6.3 % (ref 3.0–12.0)
Neutro Abs: 6.9 10*3/uL (ref 1.4–7.7)
Neutrophils Relative %: 73.6 % (ref 43.0–77.0)
Platelets: 203 10*3/uL (ref 150.0–400.0)
RBC: 4.48 Mil/uL (ref 3.87–5.11)
RDW: 13.2 % (ref 11.5–15.5)
WBC: 9.3 10*3/uL (ref 4.0–10.5)

## 2017-03-29 LAB — TSH: TSH: 1.95 u[IU]/mL (ref 0.35–4.50)

## 2017-03-29 NOTE — Telephone Encounter (Signed)
User: Cherie Dark A Date/time: 03/29/17 10:05 AM  Comment: Called pt and lmsg for her to CB to sch echo  Context:  Outcome: Left Message  Phone number: [phone] Phone Type: Home Phone  Comm. type: Telephone Call type: Outgoing  Contact: Sloan Leiter G Relation to patient: Self

## 2017-04-02 ENCOUNTER — Other Ambulatory Visit (HOSPITAL_COMMUNITY): Payer: Medicare HMO

## 2017-04-04 ENCOUNTER — Ambulatory Visit: Payer: Medicare HMO | Admitting: Neurology

## 2017-04-08 ENCOUNTER — Other Ambulatory Visit (HOSPITAL_COMMUNITY): Payer: Medicare HMO

## 2017-04-15 ENCOUNTER — Ambulatory Visit (HOSPITAL_COMMUNITY): Payer: Medicare HMO | Attending: Cardiology

## 2017-04-15 DIAGNOSIS — I10 Essential (primary) hypertension: Secondary | ICD-10-CM | POA: Diagnosis not present

## 2017-04-15 DIAGNOSIS — I350 Nonrheumatic aortic (valve) stenosis: Secondary | ICD-10-CM

## 2017-04-15 HISTORY — PX: TRANSTHORACIC ECHOCARDIOGRAM: SHX275

## 2017-05-14 ENCOUNTER — Other Ambulatory Visit: Payer: Self-pay | Admitting: Internal Medicine

## 2017-05-23 NOTE — Progress Notes (Signed)
Memory Sheppard Plumber was seen today in the movement disorders clinic for neurologic consultation at the request of Marletta Lor, MD.  The patient is seen today in neurologic consultation for the subacute evaluation of gait and speech changes for the last several months.  Looking back, she thinks that her handwriting began to change after Thanksgiving some time.  She remembers it being difficult to write her cards at thanksgiving, as the handwriting was smaller and less fluid.  She thinks that she began to have difficulty with word finding about 2 months ago.  She presented to the emergency room at [facility] in [place] on February 27 and I reviewed those records.  She had an MRI of the brain there and I do not have the films, but I do have the report.  It was nonacute and only revealed mild white matter changes.  She was told to follow up with ENT.  She did not have an ENT consult but she did follow up with her primary care physician on 06/03/2015 and was set up with a speech therapy evaluation, cardiology follow-up and was referred here.  She had a carotid ultrasound on 06/06/2015 which just demonstrated 1-39% stenosis.  I reviewed her cardiology follow-up from 06/09/2015 and they did not think that her new symptoms were related to her aortic stenosis.  Her repeat echocardiogram is pending.  I did get a note from her speech therapist and reviewed their records as well.  It was felt that she had both dysphasia as well as dysphagia and a modified barium swallow as well as traditional speech, occupational and physical therapy are being recommended.  Denies muscles jumping.  Is having trouble swallowing.  Only new medication is Lipitor and she started that a few weeks ago.    07/15/15 update:  The patient follows up today after having multiple tests performed.  She had an MRI of the cervical spine on April, 2017 that demonstrated degenerative changes and central canal stenosis and marked bilateral neural  foraminal stenosis at the C4-C5 level and C5-C6 level and significant left neural foraminal stenosis at the C3-C4 level.  She had an EMG performed that was normal, without evidence of motor neuron disease.  She had a modified barium swallow done on 06/29/2015.  There was moderate pharyngeal phase dysphagia.  Regular diet with thin liquids was recommended.  A chin tuck maneuver was helpful.  Home meds with pure was also recommended.  She had a dat scan done on 07/05/2015 with abnormal significant bilateral reduction and putamen uptake with activity largely confined to the caudate nuclei bilaterally.  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  She has been in physical therapy, speech therapy and occupational therapy consistently sent April 25.  Last visit, I started her on levodopa, 100 mg 3 times per day.  She thinks that she is doing better.    She has had 2 falls since her last visit. With one, she went to the bathroom in the middle of the night and fell backward getting in the bed.  She hurt her L shoulder especially when she lifts it.  Physical therapist did recommend a Rollator and I gave her a prescription for that.  I noted that her primary care physician has given her a handicap placard.  She was supposed to have neuropsych testing done but she didn't call to schedule it when messages were left.  She is not driving and relying on friends for transportation.  Feels that swallowing is better.  Having some monocular diplopia that is vertical.    12/22/15 update:  The patient follows up today.  She is accompanied by her daughter in law who supplements the history.  She is on carbidopa/levodopa 25/100, one tablet 3 times per day (5:30am/11am/5pm).  She does not think that the medication is helping.  No SE with the medication.  She had 1 fall at her daughter in laws house getting out of the bed.  Didn't get hurt.  Family helped her up.  No lightheadedness or near syncope.  No  hallucinations.  She does not drive.  She does continue to have some diplopia.  She had neuropsych testing with Dr. Si Raider on 10/26/2015.  There was evidence of mild cognitive impairment and depression.  Pt denies any depression.  There was no evidence of dementia.  Lives alone but staying with her family m-w and then son takes her to church on wed night and daughter in law comes thurs.  She is alone Friday night.  Son comes to her house some Saturday and sundays, her other son takes her to church.  Family concerned about her being alone but pt absolutely refuses to leave home or let someone come in.  No swallowing issues or problems with choking.  Daughter in law states that she has had some choking.  One choking episode in a restaurant on liquid this past week.    03/29/16 update:  Pt is on carbidopa/levodopa 25/100 which was increased last visit to 2 tablets in the morning, one in the afternoon and one in the evening.  [facility] and was following with the patient and I reviewed their notes.  She did go for her therapy screen, but ultimately requested that she have home health instead.  A referral was placed for this just 2 days ago.  She has some coughing episodes and I got a note from [place] yesterday asking for an order for a repeat MBE.  Coughing is mostly with liquids.  No falls.  Uses walker some of time but admits not all of the time.  Lots of blurry vision but no diplopia but noting change.  She is not driving.  She is preparing her own pill box without trouble.  Notes trouble with putting on pants.  Admits to depression  07/25/16 update: Patient seen today in follow-up, accompanied by her son who supplements the history.  She is on carbidopa/levodopa 25/100, 2 tablets in the morning, one in the afternoon and when the evening.  Last visit, Lexapro, 10 mg was added.  We called her about 6 weeks after we started it and she said it didn't help so we increased it to 20 mg.  States that she d/c lexapro because "it wasn't  doing me any good."  States that she feels good off of it.   she had a modified barium swallow on 04/10/2016.  This demonstrated mild oral phrase and moderate pharyngeal phase dysphagia.  Dysphagia 3 diet with nectar thick liquids was recommended.  Meals on wheels is coming to the house.  Caregiver comes by for an hour 5 days per week to make sure patient is okay.     11/29/16 update:  Patient seen today in follow-up for her progressive supranuclear palsy.  She is accompanied by her son who supplements the history.  She remains on carbidopa/levodopa 25/100, 2 tablets in the morning, one in the afternoon and one in the evening.    She denies choking.  She  did have a swallowing study earlier this year which recommended dysphagia 3 diet with nectar thick liquids.  She denies following this.  She does have Meals on Wheels.  She lives alone.  She has a caregiver 5 days per week, but only for an hour per day.  She thinks that she is doing well with this level of caregiving.  Her son thinks that perhaps it is time for more caregiving.  Reports several falls since last visit.  With one fall she had the walker and fell backward.  With the others, she didn't have the walker  05/24/17 update: Patient is seen today back in follow-up for her PSP.  She is accompanied by her son, Leonor Liv, who supplements the history (last visit it was her son Liliane Channel).  She is on carbidopa/levodopa 25/100, 2 tablets in the morning, 1 in the afternoon and 1 in the evening.  She has had no falls.  She has had no choking spells, but notes that she coughs a lot with eating, as does her son.  I had trouble ascertaining if she was still using the thickener (she uses "yes" to mean "no" at times and vice versa).  Last visit, I spent a significant amount time with the patient and her son discussing her living situation and they were going to try to come up with other options for long-term feasibility.  Patient does report that she has a caregiver at night, 7  days/week.  She is alone during the day.  She has had multiple falls, including one fall in the shower.  With the fall in the shower, her caregiver was present.   ALLERGIES:   Allergies  Allergen Reactions  . Codeine Phosphate     CURRENT MEDICATIONS:  Outpatient Encounter Medications as of 05/24/2017  Medication Sig  . amLODipine (NORVASC) 2.5 MG tablet TAKE 1 TABLET EVERY DAY  . aspirin 81 MG tablet Take 81 mg by mouth daily.    Marland Kitchen atorvastatin (LIPITOR) 10 MG tablet TAKE 1 TABLET EVERY DAY  . benazepril (LOTENSIN) 40 MG tablet TAKE 1 TABLET EVERY DAY  . Calcium Carbonate (CALTRATE 600) 1500 MG TABS Take by mouth 2 (two) times daily.    . carbidopa-levodopa (SINEMET IR) 25-100 MG tablet TAKE 2 TABLETS IN THE MORNING  , TAKE 1 TABLET  IN  THE  AFTERNOON  AND TAKE 1 TABLET IN THE EVENING  . Cholecalciferol (VITAMIN D) 1000 UNITS capsule Take 1,000 Units by mouth daily.    . hydrochlorothiazide (HYDRODIURIL) 25 MG tablet TAKE 1 TABLET EVERY DAY  . Multiple Vitamin (MULTIVITAMIN) tablet Take 1 tablet by mouth daily.    . Omega-3 Fatty Acids (FISH OIL) 1200 MG CAPS Take 1 capsule by mouth daily.   No facility-administered encounter medications on file as of 05/24/2017.     PAST MEDICAL HISTORY:   Past Medical History:  Diagnosis Date  . ALLERGIC RHINITIS 11/21/2006  . BACK PAIN 12/17/2006  . COLONIC POLYPS, HX OF 11/21/2006  . HYPERTENSION 11/21/2006  . OSTEOPENIA 08/11/2009  . OSTEOPOROSIS 11/21/2006  . PEDAL EDEMA 07/05/2008  . SYSTOLIC MURMUR 9/48/5462    PAST SURGICAL HISTORY:   Past Surgical History:  Procedure Laterality Date  . BREAST EXCISIONAL BIOPSY Right    benign  . BREAST SURGERY     bx  . CATARACT EXTRACTION Bilateral 2012  . HEMORRHOID SURGERY    . TONSILLECTOMY      SOCIAL HISTORY:   Social History   Socioeconomic History  . Marital  status: Single    Spouse name: Not on file  . Number of children: Not on file  . Years of education: Not on file  . Highest  education level: Not on file  Social Needs  . Financial resource strain: Not on file  . Food insecurity - worry: Not on file  . Food insecurity - inability: Not on file  . Transportation needs - medical: Not on file  . Transportation needs - non-medical: Not on file  Occupational History  . Occupation: retired    Comment: Press photographer  Tobacco Use  . Smoking status: Former Smoker    Last attempt to quit: 03/27/1995    Years since quitting: 22.1  . Smokeless tobacco: Never Used  Substance and Sexual Activity  . Alcohol use: No    Alcohol/week: 0.0 oz  . Drug use: No  . Sexual activity: Not on file  Other Topics Concern  . Not on file  Social History Narrative  . Not on file    FAMILY HISTORY:   Family Status  Relation Name Status  . Mother  Deceased at age 64       ovarian ca  . Father  Deceased at age 59       cad  . Sister  Alive       3, one with breast CA  . Sister  Deceased       32, pancreatic CA, brain tumor  . Brother  Alive       1, CAD  . Brother  Deceased       44  . Child  Alive       2 sons, alive and well    ROS:  A complete 10 system review of systems was obtained and was unremarkable apart from what is mentioned above.  PHYSICAL EXAMINATION:    VITALS:   Vitals:   05/24/17 1542  BP: 132/90  Pulse: 76  Weight: 135 lb (61.2 kg)  Height: '5\' 4"'$  (1.626 m)    GEN:  The patient appears stated age and is in NAD. HEENT:  Normocephalic, atraumatic.  The mucous membranes are moist. The superficial temporal arteries are without ropiness or tenderness. CV:  RRR with 3/6 SEM that radiates to the right carotid. Lungs:  CTAB but she has some laryngeal stridor Neck/HEME:  There are no carotid bruits bilaterally.  Neurological examination:  Orientation: The patient is alert and oriented x3.  Cranial nerves: There is good facial symmetry. She has facial hypomimia.  She has complete downgaze and upgaze paresis.  She has significant square wave jerks.  The visual  fields are full to confrontational testing. The speech is nonfluent and has dysphasia.  She has some pseudobulbar laughter.  She has intermittent grunting.  The soft palate rises symmetrically and there is no tongue deviation. Hearing is intact to conversational tone. Sensation: Sensation is intact to light touch throughout Motor: Strength is 5/5 in the bilateral upper and lower extremities.   Tongue strength is good.  Shoulder shrug is equal and symmetric.  There is no pronator drift.  No fasciculations noted anywhere, including the tongue.   Movement examination: Tone: There is no rigidity Abnormal movements: no dyskinesia today Coordination:  There is good RAMs today Gait and Station: The patient pushes off of the chair to arise.  Once up, she is mildly unsteady until she holds onto the walker and then she walks very well with it (same as previous visit)  Labs:    Chemistry  Component Value Date/Time   NA 142 03/28/2017 1610   K 3.8 03/28/2017 1610   CL 100 03/28/2017 1610   CO2 34 (H) 03/28/2017 1610   BUN 23 03/28/2017 1610   CREATININE 1.16 03/28/2017 1610   CREATININE 1.10 (H) 06/13/2015 1404      Component Value Date/Time   CALCIUM 9.8 03/28/2017 1610   ALKPHOS 80 03/28/2017 1610   AST 16 03/28/2017 1610   ALT 8 03/28/2017 1610   BILITOT 0.5 03/28/2017 1610     Lab Results  Component Value Date   TSH 1.95 03/28/2017      ASSESSMENT/PLAN:  1.  PSP  -She had a dat scan done on 07/05/2015 with abnormal significant bilateral reduction and putamen uptake with activity largely confined to the caudate nuclei bilaterally.  -Continue carbidopa/levodopa 25/100 2/1/1.   -invited to atypical parkinsonism support group.  -Long discussion with patient and her son Leonor Liv, since he has not been present previously.  Discussed my concern that she needs 24 hour/day care.  Discussed that they need to figure out whether this is home care or nursing facility care.  She would likely be  a candidate for assisted living at max assist  -Multiple falls since last visit.  Most of these were retropulsion, which is common with this disease.  Discussed the merry walker.  She refused that.  Discussed that she should only be walking if someone is walking with her.  She likely should be in a wheelchair, but does not want to do that right now.  Discussed consequences of such.  -Briefly met with our social worker today to provide support.  -Patient does have Meals on Wheels 5 days/week. 2.  Depression  -Currently off of her Lexapro and thinks that she is doing well.  She will let me know if she feels differently. 3.  Mild cognitive impairment  -She had neuropsych testing with Dr. Si Raider on 10/26/2015.  There was evidence of mild cognitive impairment and depression.  There was no evidence of dementia. 4.  Dysphagia  she had a modified barium swallow on 04/10/2016.  This demonstrated mild oral phrase and moderate pharyngeal phase dysphagia.  Dysphagia 3 diet with nectar thick liquids was recommended.  We will repeat this.  She has had lots of coughing with swallowing, and likely is having aspiration. 5.  I will plan on seeing the patient back in the next 4 months, sooner should new neurologic issues arise.  Much greater than 50% of this 45-minute visit was spent in counseling with the patient and her son.

## 2017-05-24 ENCOUNTER — Encounter: Payer: Self-pay | Admitting: Neurology

## 2017-05-24 ENCOUNTER — Encounter: Payer: Self-pay | Admitting: Psychology

## 2017-05-24 ENCOUNTER — Other Ambulatory Visit: Payer: Self-pay | Admitting: Internal Medicine

## 2017-05-24 ENCOUNTER — Ambulatory Visit: Payer: Medicare HMO | Admitting: Neurology

## 2017-05-24 VITALS — BP 132/90 | HR 76 | Ht 64.0 in | Wt 135.0 lb

## 2017-05-24 DIAGNOSIS — Z515 Encounter for palliative care: Secondary | ICD-10-CM | POA: Diagnosis not present

## 2017-05-24 DIAGNOSIS — R1319 Other dysphagia: Secondary | ICD-10-CM | POA: Diagnosis not present

## 2017-05-24 DIAGNOSIS — G231 Progressive supranuclear ophthalmoplegia [Steele-Richardson-Olszewski]: Secondary | ICD-10-CM

## 2017-05-24 NOTE — Progress Notes (Signed)
I met with the patient and her son, Tiffany [name].  It is important to note that patient's son Tiffany [name] is the one that is on the healthcare release forms.  We identified that the patient needs a care plan created.  Due to time limitations we will have to schedule a time to do this on another day.  I would like both Robert and react to be involved in this meeting.  The plan is that I will call Tiffany [name] on Monday to schedule a time to meet with all 3 of them in my office.  Patient currently does not have 24-hour care but is in need of 24-hour care due to her disease and fall risk.  In addition, I also gave the patient and her son a brochure on the atypical parkinsonian support group.  I will be in touch with the patient's son on Monday to schedule a time to for a meeting.

## 2017-05-27 ENCOUNTER — Telehealth: Payer: Self-pay | Admitting: Psychology

## 2017-05-27 ENCOUNTER — Other Ambulatory Visit (HOSPITAL_COMMUNITY): Payer: Self-pay | Admitting: Neurology

## 2017-05-27 ENCOUNTER — Telehealth: Payer: Self-pay | Admitting: Neurology

## 2017-05-27 DIAGNOSIS — R1310 Dysphagia, unspecified: Secondary | ICD-10-CM

## 2017-05-27 NOTE — Telephone Encounter (Signed)
We have scheduled you at [facility] for your modified barium swallow on 06/05/17 at 11:30 am. Please arrive 15 minutes prior and go to 1st floor radiology. If you need to reschedule for any reason please call [phone].   LMOM for patient's son with the above information.

## 2017-05-27 NOTE — Telephone Encounter (Signed)
Telephone call to son Liliane Channel and left message pertaining to scheduling a meeting with patient and 2 sons to develop a care plan for patient's mother.

## 2017-05-28 ENCOUNTER — Telehealth: Payer: Self-pay | Admitting: Psychology

## 2017-05-28 NOTE — Telephone Encounter (Signed)
Telephone call with the patient's son.  We talked about the need to have a family meeting to talk about a care plan for the patient.  He is on board with this meeting.  We tentatively scheduled for the patient and her 2 sons to meet with me  at 3 PM on March 21.

## 2017-05-29 ENCOUNTER — Other Ambulatory Visit: Payer: Self-pay | Admitting: Internal Medicine

## 2017-06-05 ENCOUNTER — Ambulatory Visit (HOSPITAL_COMMUNITY)
Admission: RE | Admit: 2017-06-05 | Discharge: 2017-06-05 | Disposition: A | Discharge status: Home / Self Care | Payer: Medicare HMO | Source: Ambulatory Visit | Attending: Neurology | Admitting: Neurology

## 2017-06-05 DIAGNOSIS — R05 Cough: Secondary | ICD-10-CM | POA: Diagnosis not present

## 2017-06-05 DIAGNOSIS — R1312 Dysphagia, oropharyngeal phase: Secondary | ICD-10-CM | POA: Diagnosis not present

## 2017-06-05 DIAGNOSIS — R1310 Dysphagia, unspecified: Secondary | ICD-10-CM | POA: Diagnosis not present

## 2017-06-05 DIAGNOSIS — R1319 Other dysphagia: Secondary | ICD-10-CM | POA: Insufficient documentation

## 2017-06-05 NOTE — Progress Notes (Signed)
Modified Barium Swallow Progress Note  Patient Details  Name: Tiffany [name] MRN: [id number] Date of Birth: Sep 19, 1932  Today's Date: 06/05/2017  Modified Barium Swallow completed.  Full report located under Chart Review in the Imaging Section.  Brief recommendations include the following:  Clinical Impression  Pt continues to demonstrate a mild oropharyngeal dysphagia. With a large initial sip of nectar, pt swallowed 1/2 the sip successfully, held the second 1/2 orally and then aspirated slightly before the second swallow with sensation. After cueing for small sips no further significant aspiration before the swallow occurred with nectar or thin. However, there was fairly consistent sensed trace frank penetration/aspiration during the swallow with thin liquids, regardless of chin tuck or head turn postures (possibly greater aspiration with head turn). Breath hold also unsuccessful. A preventative cued throat clear ejected most penetrate/aspirate. Strongly suspect reduced laryngeal closure given appearance of aspiration via the posterior commisure/posterior contact point of VF as well as hoarse vocal quality and poorly explosive cough mechanism. Recommend pt consume a regular diet and thin liquids with increased attention to small single sips and a throat clear after sips. Also suggest evaluation with ENT for assessment of laryngeal function. Depending on results and interventions, f/u with SLP may be needed to target vocal fold adduction exercises.    Swallow Evaluation Recommendations       SLP Diet Recommendations: Dysphagia 3 (Mech soft) solids;Thin liquid   Liquid Administration via: Cup   Medication Administration: Whole meds with puree   Supervision: Patient able to self feed   Compensations: Slow rate;Small sips/bites;Clear throat after each swallow   Postural Changes: Remain semi-upright after after feeds/meals (Comment)           Herbie Baltimore, MA CCC-SLP  [phone]  Lynann Beaver 06/05/2017,3:44 PM

## 2017-06-06 ENCOUNTER — Other Ambulatory Visit: Payer: Self-pay | Admitting: Internal Medicine

## 2017-06-06 DIAGNOSIS — Z1231 Encounter for screening mammogram for malignant neoplasm of breast: Secondary | ICD-10-CM

## 2017-06-07 ENCOUNTER — Other Ambulatory Visit: Payer: Self-pay | Admitting: Internal Medicine

## 2017-06-13 ENCOUNTER — Other Ambulatory Visit: Payer: Medicare HMO

## 2017-06-19 ENCOUNTER — Other Ambulatory Visit: Payer: Self-pay | Admitting: Internal Medicine

## 2017-06-20 ENCOUNTER — Other Ambulatory Visit: Payer: Self-pay | Admitting: Neurology

## 2017-07-01 ENCOUNTER — Other Ambulatory Visit: Payer: Self-pay

## 2017-07-01 ENCOUNTER — Encounter (HOSPITAL_BASED_OUTPATIENT_CLINIC_OR_DEPARTMENT_OTHER): Payer: Self-pay | Admitting: Emergency Medicine

## 2017-07-01 ENCOUNTER — Emergency Department (HOSPITAL_BASED_OUTPATIENT_CLINIC_OR_DEPARTMENT_OTHER)
Admission: EM | Admit: 2017-07-01 | Discharge: 2017-07-01 | Disposition: A | Discharge status: Home / Self Care | Payer: Medicare HMO | Attending: Emergency Medicine | Admitting: Emergency Medicine

## 2017-07-01 DIAGNOSIS — Z87891 Personal history of nicotine dependence: Secondary | ICD-10-CM | POA: Diagnosis not present

## 2017-07-01 DIAGNOSIS — I10 Essential (primary) hypertension: Secondary | ICD-10-CM | POA: Insufficient documentation

## 2017-07-01 DIAGNOSIS — Z79899 Other long term (current) drug therapy: Secondary | ICD-10-CM | POA: Diagnosis not present

## 2017-07-01 DIAGNOSIS — W2209XA Striking against other stationary object, initial encounter: Secondary | ICD-10-CM | POA: Diagnosis not present

## 2017-07-01 DIAGNOSIS — Y998 Other external cause status: Secondary | ICD-10-CM | POA: Diagnosis not present

## 2017-07-01 DIAGNOSIS — S99922A Unspecified injury of left foot, initial encounter: Secondary | ICD-10-CM

## 2017-07-01 DIAGNOSIS — Z7982 Long term (current) use of aspirin: Secondary | ICD-10-CM | POA: Insufficient documentation

## 2017-07-01 DIAGNOSIS — Y92009 Unspecified place in unspecified non-institutional (private) residence as the place of occurrence of the external cause: Secondary | ICD-10-CM | POA: Insufficient documentation

## 2017-07-01 DIAGNOSIS — S90932A Unspecified superficial injury of left great toe, initial encounter: Secondary | ICD-10-CM | POA: Diagnosis not present

## 2017-07-01 DIAGNOSIS — Y9389 Activity, other specified: Secondary | ICD-10-CM | POA: Insufficient documentation

## 2017-07-01 NOTE — ED Provider Notes (Signed)
Patient with minimally avulsed left great toenail.  Toenail is attached firmly.  It is slightly raised.  It is not loose.  Toe with good capillary refill   Tiffany Dakin, MD 07/01/17 1730

## 2017-07-01 NOTE — ED Triage Notes (Signed)
Reports injury to left great toenail on Saturday.  States she needs the toenail removed.  No active bleeding at present.  Erythema and dried blood noted.

## 2017-07-01 NOTE — ED Provider Notes (Signed)
[place] EMERGENCY DEPARTMENT Provider Note   CSN: [id number] Arrival date & time: 07/01/17  1531   History   Chief Complaint Chief Complaint  Patient presents with  . Nail Problem    HPI Tiffany [name] is a 84 y.o. female who presents with requests for left toenail removal. PMH significant for PSP. She states that she stubbed her toe on her washer 2 days ago. The nail was hanging off and she pushed it back on to the toe. It has been intermittently bleeding but she has not had a lot of pain. She has been able to walk without difficulty. Her caregiver is at bedside and states that the patient thought it may need to be removed so they came here. No aggravating/allieviating factors.  HPI  Past Medical History:  Diagnosis Date  . ALLERGIC RHINITIS 11/21/2006  . BACK PAIN 12/17/2006  . COLONIC POLYPS, HX OF 11/21/2006  . HYPERTENSION 11/21/2006  . OSTEOPENIA 08/11/2009  . OSTEOPOROSIS 11/21/2006  . PEDAL EDEMA 07/05/2008  . SYSTOLIC MURMUR 2/40/9735    Patient Active Problem List   Diagnosis Date Noted  . Dysphagia, neurologic 07/26/2016  . PSP (progressive supranuclear palsy) ([place]) 07/15/2015  . Aortic stenosis 06/09/2015  . Dysphasia 06/09/2015  . OSTEOPENIA 08/11/2009  . PEDAL EDEMA 07/05/2008  . BACK PAIN 12/17/2006  . Essential hypertension 11/21/2006  . Allergic rhinitis 11/21/2006  . Osteoporosis 11/21/2006  . History of colonic polyps 11/21/2006    Past Surgical History:  Procedure Laterality Date  . BREAST EXCISIONAL BIOPSY Right    benign  . BREAST SURGERY     bx  . CATARACT EXTRACTION Bilateral 2012  . HEMORRHOID SURGERY    . TONSILLECTOMY       OB History   None      Home Medications    Prior to Admission medications   Medication Sig Start Date End Date Taking? Authorizing Provider  amLODipine (NORVASC) 2.5 MG tablet TAKE 1 TABLET EVERY DAY 09/14/16   Marletta Lor, MD  aspirin 81 MG tablet Take 81 mg by mouth daily.       [provider]  atorvastatin (LIPITOR) 10 MG tablet TAKE 1 TABLET EVERY DAY 06/19/17   Marletta Lor, MD  benazepril (LOTENSIN) 40 MG tablet TAKE 1 TABLET EVERY DAY 05/14/17   Marletta Lor, MD  Calcium Carbonate (CALTRATE 600) 1500 MG TABS Take by mouth 2 (two) times daily.      [provider]  carbidopa-levodopa (SINEMET IR) 25-100 MG tablet TAKE 2 TABLETS IN THE MORNING, 1 TABLET  IN  THE  AFTERNOON AND 1 TABLET IN THE EVENING 06/20/17   Tat, Eustace Quail, DO  Cholecalciferol (VITAMIN D) 1000 UNITS capsule Take 1,000 Units by mouth daily.      [provider]  hydrochlorothiazide (HYDRODIURIL) 25 MG tablet TAKE 1 TABLET EVERY DAY 06/19/17   Marletta Lor, MD  Multiple Vitamin (MULTIVITAMIN) tablet Take 1 tablet by mouth daily.      [provider]  Omega-3 Fatty Acids (FISH OIL) 1200 MG CAPS Take 1 capsule by mouth daily.    [provider]    Family History History reviewed. No pertinent family history.  Social History Social History   Tobacco Use  . Smoking status: Former Smoker    Last attempt to quit: 03/27/1995    Years since quitting: 22.2  . Smokeless tobacco: Never Used  Substance Use Topics  . Alcohol use: No    Alcohol/week: 0.0  oz  . Drug use: No     Allergies   Codeine phosphate   Review of Systems Review of Systems  Musculoskeletal: Positive for arthralgias.  Skin: Positive for wound.     Physical Exam Updated Vital Signs BP (!) 143/57 (BP Location: Left Arm)   Pulse 76   Temp 98.1 F (36.7 C) (Oral)   Resp 16   Ht 5\' 4"  (1.626 m)   Wt 62.1 kg (137 lb)   SpO2 98%   BMI 23.52 kg/m   Physical Exam  Constitutional: She is oriented to person, place, and time. She appears well-developed and well-nourished. No distress.  Dysarthria due to PSP. Calm, cooperative  HENT:  Head: Normocephalic and atraumatic.  Eyes: Pupils are equal, round, and reactive to light. Conjunctivae are normal. Right  eye exhibits no discharge. Left eye exhibits no discharge. No scleral icterus.  Neck: Normal range of motion.  Cardiovascular: Normal rate.  Pulmonary/Chest: Effort normal. No respiratory distress.  Abdominal: She exhibits no distension.  Musculoskeletal:  Left great toenail is painted. There is dried blood underneath the nail but no active bleeding. There is no redness or drainage around the nail. The nail is firmly intact with the nailbed. 2+ DP pulse  Neurological: She is alert and oriented to person, place, and time.  Skin: Skin is warm and dry.  Psychiatric: She has a normal mood and affect. Her behavior is normal.  Nursing note and vitals reviewed.    ED Treatments / Results  Labs (all labs ordered are listed, but only abnormal results are displayed) Labs Reviewed - No data to display  EKG None  Radiology No results found.  Procedures Procedures (including critical care time)  Medications Ordered in ED Medications - No data to display   Initial Impression / Assessment and Plan / ED Course  I have reviewed the triage vital signs and the nursing notes.  Pertinent labs & imaging results that were available during my care of the patient were reviewed by me and considered in my medical decision making (see chart for details).  84 year old female with requests for nail removal after a left great toenail injury. Her nail is firmly attached to the nailbed and there is no obvious signs of trauma or pain. Shared visit with Dr. Winfred Leeds. Advised the patient that it will likely fall off on its own but that removal would not be very beneficial for her in the ED today. She verbalized understanding. Podiatry follow up with given.  Final Clinical Impressions(s) / ED Diagnoses   Final diagnoses:  Injury of left great toe, initial encounter    ED Discharge Orders    None       Recardo Evangelist, PA-C 07/01/17 1722    Orlie Dakin, MD 07/01/17 2324

## 2017-07-01 NOTE — Discharge Instructions (Signed)
Please follow up with podiatry Your toenail will likely fall off on it's own If you toenail is bleeding, please place a bandage over it

## 2017-07-08 ENCOUNTER — Ambulatory Visit
Admission: RE | Admit: 2017-07-08 | Discharge: 2017-07-08 | Disposition: A | Discharge status: Home / Self Care | Payer: Medicare HMO | Source: Ambulatory Visit | Attending: Internal Medicine | Admitting: Internal Medicine

## 2017-07-08 DIAGNOSIS — Z1231 Encounter for screening mammogram for malignant neoplasm of breast: Secondary | ICD-10-CM

## 2017-07-16 ENCOUNTER — Other Ambulatory Visit: Payer: Self-pay | Admitting: Internal Medicine

## 2017-07-22 ENCOUNTER — Emergency Department (HOSPITAL_BASED_OUTPATIENT_CLINIC_OR_DEPARTMENT_OTHER): Payer: Medicare HMO

## 2017-07-22 ENCOUNTER — Encounter (HOSPITAL_BASED_OUTPATIENT_CLINIC_OR_DEPARTMENT_OTHER): Payer: Self-pay | Admitting: *Deleted

## 2017-07-22 ENCOUNTER — Emergency Department (HOSPITAL_BASED_OUTPATIENT_CLINIC_OR_DEPARTMENT_OTHER)
Admission: EM | Admit: 2017-07-22 | Discharge: 2017-07-22 | Disposition: A | Discharge status: Home / Self Care | Payer: Medicare HMO | Attending: Emergency Medicine | Admitting: Emergency Medicine

## 2017-07-22 ENCOUNTER — Other Ambulatory Visit: Payer: Self-pay

## 2017-07-22 DIAGNOSIS — S4991XA Unspecified injury of right shoulder and upper arm, initial encounter: Secondary | ICD-10-CM | POA: Diagnosis not present

## 2017-07-22 DIAGNOSIS — I10 Essential (primary) hypertension: Secondary | ICD-10-CM | POA: Insufficient documentation

## 2017-07-22 DIAGNOSIS — Z7982 Long term (current) use of aspirin: Secondary | ICD-10-CM | POA: Diagnosis not present

## 2017-07-22 DIAGNOSIS — G8929 Other chronic pain: Secondary | ICD-10-CM | POA: Insufficient documentation

## 2017-07-22 DIAGNOSIS — Y999 Unspecified external cause status: Secondary | ICD-10-CM | POA: Diagnosis not present

## 2017-07-22 DIAGNOSIS — G8911 Acute pain due to trauma: Secondary | ICD-10-CM

## 2017-07-22 DIAGNOSIS — G20 Parkinson's disease: Secondary | ICD-10-CM | POA: Insufficient documentation

## 2017-07-22 DIAGNOSIS — M25511 Pain in right shoulder: Secondary | ICD-10-CM | POA: Diagnosis not present

## 2017-07-22 DIAGNOSIS — Y929 Unspecified place or not applicable: Secondary | ICD-10-CM | POA: Diagnosis not present

## 2017-07-22 DIAGNOSIS — W010XXA Fall on same level from slipping, tripping and stumbling without subsequent striking against object, initial encounter: Secondary | ICD-10-CM | POA: Insufficient documentation

## 2017-07-22 DIAGNOSIS — Y939 Activity, unspecified: Secondary | ICD-10-CM | POA: Diagnosis not present

## 2017-07-22 DIAGNOSIS — Z79899 Other long term (current) drug therapy: Secondary | ICD-10-CM | POA: Insufficient documentation

## 2017-07-22 DIAGNOSIS — Z87891 Personal history of nicotine dependence: Secondary | ICD-10-CM | POA: Insufficient documentation

## 2017-07-22 MED ORDER — HYDROCODONE-ACETAMINOPHEN 5-325 MG PO TABS
1.0000 | ORAL_TABLET | Freq: Once | ORAL | Status: DC
  Filled 2017-07-22: qty 1

## 2017-07-22 NOTE — ED Triage Notes (Signed)
Pt brought to ER by her caregiver reporting fall yesterday with right shoulder pain. Pt states "I tripped and fell." caregiver gave one aleve this am, pt denies any relief of pain. Pt denies hitting head or any other injury.

## 2017-07-22 NOTE — ED Provider Notes (Signed)
[facility] [facility] EMERGENCY DEPARTMENT Provider Note   CSN: [id number] Arrival date & time: 07/22/17  1019     History   Chief Complaint Chief Complaint  Patient presents with  . Fall    HPI Tiffany [name] is a 84 y.o. female.  HPI Patient is an 84 year old female presents to the emergency department with ongoing pain in her right shoulder and right scapular region since a fall yesterday.  She tripped and fell.  She was able to get herself dressed and go to church.  She continues to complain of pain today and pain with range of motion of her right shoulder.  She tried Aleve this morning without significant improvement in her pain.  No head injury.  No neck pain.  No chest pain or shortness of breath.  She reports pain with range of motion of her right shoulder which is moderate in severity.  No obvious deformity at this time   Past Medical History:  Diagnosis Date  . ALLERGIC RHINITIS 11/21/2006  . BACK PAIN 12/17/2006  . COLONIC POLYPS, HX OF 11/21/2006  . HYPERTENSION 11/21/2006  . OSTEOPENIA 08/11/2009  . OSTEOPOROSIS 11/21/2006  . Parkinson's disease ([place])   . PEDAL EDEMA 07/05/2008  . SYSTOLIC MURMUR 08/30/3708    Patient Active Problem List   Diagnosis Date Noted  . Dysphagia, neurologic 07/26/2016  . PSP (progressive supranuclear palsy) ([place]) 07/15/2015  . Aortic stenosis 06/09/2015  . Dysphasia 06/09/2015  . OSTEOPENIA 08/11/2009  . PEDAL EDEMA 07/05/2008  . BACK PAIN 12/17/2006  . Essential hypertension 11/21/2006  . Allergic rhinitis 11/21/2006  . Osteoporosis 11/21/2006  . History of colonic polyps 11/21/2006    Past Surgical History:  Procedure Laterality Date  . BREAST EXCISIONAL BIOPSY Right    benign  . BREAST SURGERY     bx  . CATARACT EXTRACTION Bilateral 2012  . HEMORRHOID SURGERY    . TONSILLECTOMY       OB History   None      Home Medications    Prior to Admission medications   Medication Sig Start Date End Date Taking?  Authorizing Provider  amLODipine (NORVASC) 2.5 MG tablet TAKE 1 TABLET EVERY DAY 07/17/17   Marletta Lor, MD  aspirin 81 MG tablet Take 81 mg by mouth daily.      [provider]  atorvastatin (LIPITOR) 10 MG tablet TAKE 1 TABLET EVERY DAY 06/19/17   Marletta Lor, MD  benazepril (LOTENSIN) 40 MG tablet TAKE 1 TABLET EVERY DAY 05/14/17   Marletta Lor, MD  Calcium Carbonate (CALTRATE 600) 1500 MG TABS Take by mouth 2 (two) times daily.      [provider]  carbidopa-levodopa (SINEMET IR) 25-100 MG tablet TAKE 2 TABLETS IN THE MORNING, 1 TABLET  IN  THE  AFTERNOON AND 1 TABLET IN THE EVENING 06/20/17   Tat, Eustace Quail, DO  Cholecalciferol (VITAMIN D) 1000 UNITS capsule Take 1,000 Units by mouth daily.      [provider]  hydrochlorothiazide (HYDRODIURIL) 25 MG tablet TAKE 1 TABLET EVERY DAY 06/19/17   Marletta Lor, MD  Multiple Vitamin (MULTIVITAMIN) tablet Take 1 tablet by mouth daily.      [provider]  Omega-3 Fatty Acids (FISH OIL) 1200 MG CAPS Take 1 capsule by mouth daily.    [provider]    Family History History reviewed. No pertinent family history.  Social History Social History   Tobacco Use  . Smoking status: Former Smoker  Last attempt to quit: 03/27/1995    Years since quitting: 22.3  . Smokeless tobacco: Never Used  Substance Use Topics  . Alcohol use: No    Alcohol/week: 0.0 oz  . Drug use: No     Allergies   Codeine phosphate   Review of Systems Review of Systems  All other systems reviewed and are negative.    Physical Exam Updated Vital Signs BP (!) 155/73 (BP Location: Right Arm)   Pulse 72   Temp 98 F (36.7 C) (Oral)   Resp 18   Physical Exam  Constitutional: She is oriented to person, place, and time. She appears well-developed and well-nourished. No distress.  HENT:  Head: Normocephalic and atraumatic.  Eyes: EOM are normal.  Neck: Normal range of motion.    Cardiovascular: Normal rate.  Pulmonary/Chest: Effort normal.  Abdominal: Soft. She exhibits no distension. There is no tenderness.  Musculoskeletal: Normal range of motion.  No chest tenderness.  Mild pain with range of motion of the right shoulder passive and active range of motion.  No obvious deformity of the right shoulder noted.  Some tenderness of the right lateral shoulder as well as the lateral aspect of the right scapula.  No bruising noted.  No tenderness over the right AC joint.  Normal right clavicle on examination.  Normal right radial pulse.  Normal grip strength right hand.  No swelling of the right upper extremity as compared to left  Neurological: She is alert and oriented to person, place, and time.  Skin: Skin is warm and dry.  Psychiatric: She has a normal mood and affect. Judgment normal.  Nursing note and vitals reviewed.    ED Treatments / Results  Labs (all labs ordered are listed, but only abnormal results are displayed) Labs Reviewed - No data to display  EKG None  Radiology Dg Scapula Right  Result Date: 07/22/2017 CLINICAL DATA:  Patient was unable to tolerate positioning for the scapular series. A single AP view is reviewed. EXAM: RIGHT SCAPULA - 2+ VIEWS COMPARISON:  Right shoulder series of today's date FINDINGS: The scapula is intact where visualized. There is mild narrowing of the glenohumeral joint. There is spurring of the humeral head inferiorly. There is calcification in the the subacromial subdeltoid space. IMPRESSION: The visualized portions of the scapula are normal. There are degenerative changes of the glenohumeral joint and probable calcific tendinosis of the supraspinatus tendon. Electronically Signed   By: David  Martinique M.D.   On: 07/22/2017 11:03   Dg Shoulder Right  Result Date: 07/22/2017 CLINICAL DATA:  Status post fall yesterday with right shoulder and scapular pain. Unable to abduct the arm for the axillary view or AP scapular image.  EXAM: RIGHT SHOULDER - 2+ VIEW COMPARISON:  No recent studies in [facility] FINDINGS: The bones are subjectively adequately mineralized. There is a spur arising from the inferior articular margin of the humeral head. There is minimal narrowing of the glenohumeral joint. The [facility] joint is grossly normal. The subacromial subdeltoid space is normal. There is calcification within the substance of the distal portion of the supraspinatus tendon. IMPRESSION: Mild osteoarthritic spurring of the glenohumeral joint and narrowing of the joint space. Probable calcific tendinosis. No acute fracture. Electronically Signed   By: David  Martinique M.D.   On: 07/22/2017 11:02    Procedures Procedures (including critical care time)  Medications Ordered in ED Medications  HYDROcodone-acetaminophen (NORCO/VICODIN) 5-325 MG per tablet 1 tablet (1 tablet Oral Refused 07/22/17 1112)  Initial Impression / Assessment and Plan / ED Course  I have reviewed the triage vital signs and the nursing notes.  Pertinent labs & imaging results that were available during my care of the patient were reviewed by me and considered in my medical decision making (see chart for details).     I personally reviewed the patient's x-ray which demonstrates no acute osseous abnormalities  Could represent ligamentous injury.  Sling for comfort.  Sports medicine follow-up.  No signs to suggest DVT.  Normal arterial flow.  No signs of infection.  No signs of zoster.  Discharged home with recommendations for anti-inflammatories and primary care follow-up as well as subspecialist follow-up.  Patient and family understand to return to the ER for new or worsening symptoms  Final Clinical Impressions(s) / ED Diagnoses   Final diagnoses:  Acute shoulder pain due to trauma, right    ED Discharge Orders    None       Jola Schmidt, MD 07/22/17 1131

## 2017-07-24 ENCOUNTER — Telehealth: Payer: Self-pay | Admitting: Internal Medicine

## 2017-07-24 NOTE — Telephone Encounter (Signed)
[place] [employer] form to be filled out.  Placed in Dr's folder.  Call [phone] upon completion.

## 2017-07-25 ENCOUNTER — Telehealth: Payer: Self-pay | Admitting: Neurology

## 2017-07-25 NOTE — Telephone Encounter (Signed)
Pt's son Delfino Lovett left a VM message asking for some progress reports on pt because she is moving into assisted living

## 2017-07-25 NOTE — Telephone Encounter (Signed)
Formed filled out and ready for pick up at front desk.

## 2017-07-26 ENCOUNTER — Telehealth: Payer: Self-pay | Admitting: Neurology

## 2017-07-26 NOTE — Telephone Encounter (Signed)
Pt needs progress reports for an assisted living facility that pt will or wants to go to

## 2017-07-26 NOTE — Telephone Encounter (Signed)
Left message on machine for patient's son to call back. [phone].

## 2017-07-26 NOTE — Telephone Encounter (Signed)
Spoke with son who needs patient's visit notes. I gave him the phone number to medical records. [phone].

## 2017-08-06 ENCOUNTER — Telehealth: Payer: Self-pay | Admitting: Neurology

## 2017-08-06 DIAGNOSIS — G231 Progressive supranuclear ophthalmoplegia [Steele-Richardson-Olszewski]: Secondary | ICD-10-CM

## 2017-08-06 MED ORDER — AMBULATORY NON FORMULARY MEDICATION: 0 refills | Status: DC

## 2017-08-06 NOTE — Telephone Encounter (Signed)
RX mailed.

## 2017-08-06 NOTE — Telephone Encounter (Signed)
Tiffany [name] pt's daughter in law wanted to know if Dr tat can order a wheel chair for pt, pt's daughter in law said pt's son wanted her to call and see if that can be done   CB# [phone]

## 2017-08-06 NOTE — Telephone Encounter (Signed)
Spoke with patient's daughter-in-law. They just need a standard wheelchair. Will write RX and mail to them.

## 2017-08-09 ENCOUNTER — Encounter: Payer: Self-pay | Admitting: Family Medicine

## 2017-08-09 ENCOUNTER — Ambulatory Visit: Payer: Medicare HMO | Admitting: Family Medicine

## 2017-08-09 ENCOUNTER — Ambulatory Visit: Payer: Self-pay

## 2017-08-09 VITALS — BP 147/71 | HR 70 | Ht 64.0 in | Wt 137.0 lb

## 2017-08-09 DIAGNOSIS — S4991XA Unspecified injury of right shoulder and upper arm, initial encounter: Secondary | ICD-10-CM

## 2017-08-09 NOTE — Patient Instructions (Signed)
Your rotator cuff looks great - you have a very small tear in your infraspinatus but your major problem is a medial scapular body fracture with an overlying hematoma. Use a sling as needed for comfort. Ice or heat (whichever feels better at this point). Aleve 1-2 tabs twice a day with food for pain and inflammation. Tylenol 500 mg 1-2 tabs three times a day as needed for pain. Topical biofreeze or capsaicin up to 4 times a day. Salon pas patches will also help with pain. Follow up with me in 3 weeks. At that time we will reevaluate you and likely start you in physical therapy. It's ok for you to do some simple motion exercises of this shoulder though starting now (arm circles, swings).

## 2017-08-11 ENCOUNTER — Encounter: Payer: Self-pay | Admitting: Family Medicine

## 2017-08-11 DIAGNOSIS — S4991XA Unspecified injury of right shoulder and upper arm, initial encounter: Secondary | ICD-10-CM | POA: Insufficient documentation

## 2017-08-11 NOTE — Progress Notes (Signed)
PCP: Marletta Lor, MD  Subjective:   HPI: Patient is a 84 y.o. female here for right shoulder injury.  Patient fell onto her right side on 4/28. She landed directly onto right shoulder. Since that time she's continued to have sharp pain at 5/10 level posterior right shoulder. Pain wakes her up at night. Worse with coughing. Taking aleve 1-2 tablets a day and using heat. No prior injuries to this shoulder. Right handed. No skin changes, numbness.  Past Medical History:  Diagnosis Date  . ALLERGIC RHINITIS 11/21/2006  . BACK PAIN 12/17/2006  . COLONIC POLYPS, HX OF 11/21/2006  . HYPERTENSION 11/21/2006  . OSTEOPENIA 08/11/2009  . OSTEOPOROSIS 11/21/2006  . Parkinson's disease ([place])   . PEDAL EDEMA 07/05/2008  . SYSTOLIC MURMUR 3/71/6967    Current Outpatient Medications on File Prior to Visit  Medication Sig Dispense Refill  . AMBULATORY NON FORMULARY MEDICATION Standard Wheelchair DX: G23.1 1 Device 0  . amLODipine (NORVASC) 2.5 MG tablet TAKE 1 TABLET EVERY DAY 90 tablet 3  . aspirin 81 MG tablet Take 81 mg by mouth daily.      Marland Kitchen atorvastatin (LIPITOR) 10 MG tablet TAKE 1 TABLET EVERY DAY 90 tablet 3  . benazepril (LOTENSIN) 40 MG tablet TAKE 1 TABLET EVERY DAY 90 tablet 1  . Calcium Carbonate (CALTRATE 600) 1500 MG TABS Take by mouth 2 (two) times daily.      . carbidopa-levodopa (SINEMET IR) 25-100 MG tablet TAKE 2 TABLETS IN THE MORNING, 1 TABLET  IN  THE  AFTERNOON AND 1 TABLET IN THE EVENING 360 tablet 1  . Cholecalciferol (VITAMIN D) 1000 UNITS capsule Take 1,000 Units by mouth daily.      . hydrochlorothiazide (HYDRODIURIL) 25 MG tablet TAKE 1 TABLET EVERY DAY 90 tablet 1  . Multiple Vitamin (MULTIVITAMIN) tablet Take 1 tablet by mouth daily.      . Omega-3 Fatty Acids (FISH OIL) 1200 MG CAPS Take 1 capsule by mouth daily.     No current facility-administered medications on file prior to visit.     Past Surgical History:  Procedure Laterality Date  . BREAST  EXCISIONAL BIOPSY Right    benign  . BREAST SURGERY     bx  . CATARACT EXTRACTION Bilateral 2012  . HEMORRHOID SURGERY    . TONSILLECTOMY      Allergies  Allergen Reactions  . Codeine Phosphate     Social History   Socioeconomic History  . Marital status: Single    Spouse name: Not on file  . Number of children: Not on file  . Years of education: Not on file  . Highest education level: Not on file  Occupational History  . Occupation: retired    Comment: Geographical information systems officer  . Financial resource strain: Not on file  . Food insecurity:    Worry: Not on file    Inability: Not on file  . Transportation needs:    Medical: Not on file    Non-medical: Not on file  Tobacco Use  . Smoking status: Former Smoker    Last attempt to quit: 03/27/1995    Years since quitting: 22.3  . Smokeless tobacco: Never Used  Substance and Sexual Activity  . Alcohol use: No    Alcohol/week: 0.0 oz  . Drug use: No  . Sexual activity: Not on file  Lifestyle  . Physical activity:    Days per week: Not on file    Minutes per session: Not on file  .  Stress: Not on file  Relationships  . Social connections:    Talks on phone: Not on file    Gets together: Not on file    Attends religious service: Not on file    Active member of club or organization: Not on file    Attends meetings of clubs or organizations: Not on file    Relationship status: Not on file  . Intimate partner violence:    Fear of current or ex partner: Not on file    Emotionally abused: Not on file    Physically abused: Not on file    Forced sexual activity: Not on file  Other Topics Concern  . Not on file  Social History Narrative  . Not on file    History reviewed. No pertinent family history.  BP (!) 147/71   Pulse 70   Ht 5\' 4"  (1.626 m)   Wt 137 lb (62.1 kg)   BMI 23.52 kg/m   Review of Systems: See HPI above.     Objective:  Physical Exam:  Gen: NAD, comfortable in exam room  Right shoulder: No  swelling, ecchymoses.  No gross deformity. TTP medial scapula and body of scapula.  No other shoulder tenderness including clavicle, AC joint. ROM limited to 20 degrees abduction and flexion.  ER 30 degrees, full IR. Cannot position for empty can.  Strength 5/5 IR.  Pain, very limited strength with ER. NV intact distally.  Left shoulder: No swelling, ecchymoses.  No gross deformity. No TTP. ROM limited to 60 degrees ER, 140 flexion and abduction. Strength 5/5 with empty can and resisted internal/external rotation. NV intact distally.   MSK u/s Right shoulder:  Biceps tendon intact on long and trans views.  Subscapularis intact.  Infraspinatus with very small insertional tear.  Supraspinatus intact without tears.  Medial scapular body fracture with overlying large hematoma.  Assessment & Plan:  1. Right shoulder injury - performed and independently reviewed ultrasound - primary issue is scapular body fracture.  Her infraspinatus tear may be old as well.  Sling as needed.  Ice or heat.  Aleve, tylenol, topical medications, salon pas.  F/u in 3 weeks.  Plan to start PT at that time.  Motion exercises.

## 2017-08-11 NOTE — Assessment & Plan Note (Signed)
performed and independently reviewed ultrasound - primary issue is scapular body fracture.  Her infraspinatus tear may be old as well.  Sling as needed.  Ice or heat.  Aleve, tylenol, topical medications, salon pas.  F/u in 3 weeks.  Plan to start PT at that time.  Motion exercises.

## 2017-09-02 ENCOUNTER — Ambulatory Visit: Payer: Medicare HMO | Admitting: Family Medicine

## 2017-09-12 ENCOUNTER — Encounter: Payer: Self-pay | Admitting: Internal Medicine

## 2017-09-12 DIAGNOSIS — H26491 Other secondary cataract, right eye: Secondary | ICD-10-CM | POA: Diagnosis not present

## 2017-09-12 DIAGNOSIS — H04123 Dry eye syndrome of bilateral lacrimal glands: Secondary | ICD-10-CM | POA: Diagnosis not present

## 2017-09-12 DIAGNOSIS — H353131 Nonexudative age-related macular degeneration, bilateral, early dry stage: Secondary | ICD-10-CM | POA: Diagnosis not present

## 2017-09-12 DIAGNOSIS — Z961 Presence of intraocular lens: Secondary | ICD-10-CM | POA: Diagnosis not present

## 2017-09-12 LAB — HM DIABETES EYE EXAM

## 2017-09-23 NOTE — Progress Notes (Signed)
Tiffany [name] was seen today in the movement disorders clinic for neurologic consultation at the request of Marletta Lor, MD.  The patient is seen today in neurologic consultation for the subacute evaluation of gait and speech changes for the last several months.  Looking back, she thinks that her handwriting began to change after Thanksgiving some time.  She remembers it being difficult to write her cards at thanksgiving, as the handwriting was smaller and less fluid.  She thinks that she began to have difficulty with word finding about 2 months ago.  She presented to the emergency room at [facility] in [place] on February 27 and I reviewed those records.  She had an MRI of the brain there and I do not have the films, but I do have the report.  It was nonacute and only revealed mild white matter changes.  She was told to follow up with ENT.  She did not have an ENT consult but she did follow up with her primary care physician on 06/03/2015 and was set up with a speech therapy evaluation, cardiology follow-up and was referred here.  She had a carotid ultrasound on 06/06/2015 which just demonstrated 1-39% stenosis.  I reviewed her cardiology follow-up from 06/09/2015 and they did not think that her new symptoms were related to her aortic stenosis.  Her repeat echocardiogram is pending.  I did get a note from her speech therapist and reviewed their records as well.  It was felt that she had both dysphasia as well as dysphagia and a modified barium swallow as well as traditional speech, occupational and physical therapy are being recommended.  Denies muscles jumping.  Is having trouble swallowing.  Only new medication is Lipitor and she started that a few weeks ago.    07/15/15 update:  The patient follows up today after having multiple tests performed.  She had an MRI of the cervical spine on April, 2017 that demonstrated degenerative changes and central canal stenosis and marked bilateral neural  foraminal stenosis at the C4-C5 level and C5-C6 level and significant left neural foraminal stenosis at the C3-C4 level.  She had an EMG performed that was normal, without evidence of motor neuron disease.  She had a modified barium swallow done on 06/29/2015.  There was moderate pharyngeal phase dysphagia.  Regular diet with thin liquids was recommended.  A chin tuck maneuver was helpful.  Home meds with pure was also recommended.  She had a dat scan done on 07/05/2015 with abnormal significant bilateral reduction and putamen uptake with activity largely confined to the caudate nuclei bilaterally.  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  She has been in physical therapy, speech therapy and occupational therapy consistently sent April 25.  Last visit, I started her on levodopa, 100 mg 3 times per day.  She thinks that she is doing better.    She has had 2 falls since her last visit. With one, she went to the bathroom in the middle of the night and fell backward getting in the bed.  She hurt her L shoulder especially when she lifts it.  Physical therapist did recommend a Rollator and I gave her a prescription for that.  I noted that her primary care physician has given her a handicap placard.  She was supposed to have neuropsych testing done but she didn't call to schedule it when messages were left.  She is not driving and relying on friends for transportation.  Feels that swallowing is better.  Having some monocular diplopia that is vertical.    12/22/15 update:  The patient follows up today.  She is accompanied by her daughter in law who supplements the history.  She is on carbidopa/levodopa 25/100, one tablet 3 times per day (5:30am/11am/5pm).  She does not think that the medication is helping.  No SE with the medication.  She had 1 fall at her daughter in laws house getting out of the bed.  Didn't get hurt.  Family helped her up.  No lightheadedness or near syncope.  No  hallucinations.  She does not drive.  She does continue to have some diplopia.  She had neuropsych testing with Dr. Si Raider on 10/26/2015.  There was evidence of mild cognitive impairment and depression.  Pt denies any depression.  There was no evidence of dementia.  Lives alone but staying with her family m-w and then son takes her to church on wed night and daughter in law comes thurs.  She is alone Friday night.  Son comes to her house some Saturday and sundays, her other son takes her to church.  Family concerned about her being alone but pt absolutely refuses to leave home or let someone come in.  No swallowing issues or problems with choking.  Daughter in law states that she has had some choking.  One choking episode in a restaurant on liquid this past week.    03/29/16 update:  Pt is on carbidopa/levodopa 25/100 which was increased last visit to 2 tablets in the morning, one in the afternoon and one in the evening.  [facility] and was following with the patient and I reviewed their notes.  She did go for her therapy screen, but ultimately requested that she have home health instead.  A referral was placed for this just 2 days ago.  She has some coughing episodes and I got a note from [place] yesterday asking for an order for a repeat MBE.  Coughing is mostly with liquids.  No falls.  Uses walker some of time but admits not all of the time.  Lots of blurry vision but no diplopia but noting change.  She is not driving.  She is preparing her own pill box without trouble.  Notes trouble with putting on pants.  Admits to depression  07/25/16 update: Patient seen today in follow-up, accompanied by her son who supplements the history.  She is on carbidopa/levodopa 25/100, 2 tablets in the morning, one in the afternoon and when the evening.  Last visit, Lexapro, 10 mg was added.  We called her about 6 weeks after we started it and she said it didn't help so we increased it to 20 mg.  States that she d/c lexapro because "it wasn't  doing me any good."  States that she feels good off of it.   she had a modified barium swallow on 04/10/2016.  This demonstrated mild oral phrase and moderate pharyngeal phase dysphagia.  Dysphagia 3 diet with nectar thick liquids was recommended.  Meals on wheels is coming to the house.  Caregiver comes by for an hour 5 days per week to make sure patient is okay.     11/29/16 update:  Patient seen today in follow-up for her progressive supranuclear palsy.  She is accompanied by her son who supplements the history.  She remains on carbidopa/levodopa 25/100, 2 tablets in the morning, one in the afternoon and one in the evening.    She denies choking.  She  did have a swallowing study earlier this year which recommended dysphagia 3 diet with nectar thick liquids.  She denies following this.  She does have Meals on Wheels.  She lives alone.  She has a caregiver 5 days per week, but only for an hour per day.  She thinks that she is doing well with this level of caregiving.  Her son thinks that perhaps it is time for more caregiving.  Reports several falls since last visit.  With one fall she had the walker and fell backward.  With the others, she didn't have the walker  05/24/17 update: Patient is seen today back in follow-up for her PSP.  She is accompanied by her son, Leonor Liv, who supplements the history (last visit it was her son Liliane Channel).  She is on carbidopa/levodopa 25/100, 2 tablets in the morning, 1 in the afternoon and 1 in the evening.  She has had no falls.  She has had no choking spells, but notes that she coughs a lot with eating, as does her son.  I had trouble ascertaining if she was still using the thickener (she uses "yes" to mean "no" at times and vice versa).  Last visit, I spent a significant amount time with the patient and her son discussing her living situation and they were going to try to come up with other options for long-term feasibility.  Patient does report that she has a caregiver at night, 7  days/week.  She is alone during the day.  She has had multiple falls, including one fall in the shower.  With the fall in the shower, her caregiver was present.  09/24/17 update: Patient is seen today in follow-up for PSP.  She is accompanied by her son who supplements the history.  The patient is on carbidopa/levodopa 25/100, 2 tablets in the morning, 1 in the afternoon and 1 in the evening.  Modified barium swallow was completed on June 05, 2017.  Mild oropharyngeal dysphagia was noted.  Dysphagia 3 diet with thin liquids was recommended.    Records have been reviewed since our last visit.  She was seen by primary care in the middle of May.  She had a fall at the end of April (was Easter Sunday) and landed on her right shoulder.  She initially didn't tell the family until she realized she got hurt.   She was given a sling and told to apply ice or heat.  After that the family hired 24/7 caregiving.  There are 5 ladies coming into the home and visiting Angels come into the home.  They have the application for [facility] if they need that.    ALLERGIES:   Allergies  Allergen Reactions  . Codeine Phosphate     CURRENT MEDICATIONS:  Outpatient Encounter Medications as of 09/24/2017  Medication Sig  . AMBULATORY NON FORMULARY MEDICATION Standard Wheelchair DX: G23.1  . amLODipine (NORVASC) 2.5 MG tablet TAKE 1 TABLET EVERY DAY  . aspirin 81 MG tablet Take 81 mg by mouth daily.    Marland Kitchen atorvastatin (LIPITOR) 10 MG tablet TAKE 1 TABLET EVERY DAY  . benazepril (LOTENSIN) 40 MG tablet TAKE 1 TABLET EVERY DAY  . Calcium Carbonate (CALTRATE 600) 1500 MG TABS Take by mouth 2 (two) times daily.    . carbidopa-levodopa (SINEMET IR) 25-100 MG tablet TAKE 2 TABLETS IN THE MORNING, 1 TABLET  IN  THE  AFTERNOON AND 1 TABLET IN THE EVENING  . Cholecalciferol (VITAMIN D) 1000 UNITS capsule Take 1,000 Units by  mouth daily.    . hydrochlorothiazide (HYDRODIURIL) 25 MG tablet TAKE 1 TABLET EVERY DAY  . Multiple Vitamin  (MULTIVITAMIN) tablet Take 1 tablet by mouth daily.    . Omega-3 Fatty Acids (FISH OIL) 1200 MG CAPS Take 1 capsule by mouth daily.  . [DISCONTINUED] carbidopa-levodopa (SINEMET IR) 25-100 MG tablet TAKE 2 TABLETS IN THE MORNING, 1 TABLET  IN  THE  AFTERNOON AND 1 TABLET IN THE EVENING   No facility-administered encounter medications on file as of 09/24/2017.     PAST MEDICAL HISTORY:   Past Medical History:  Diagnosis Date  . ALLERGIC RHINITIS 11/21/2006  . BACK PAIN 12/17/2006  . COLONIC POLYPS, HX OF 11/21/2006  . HYPERTENSION 11/21/2006  . OSTEOPENIA 08/11/2009  . OSTEOPOROSIS 11/21/2006  . Parkinson's disease ([place])   . PEDAL EDEMA 07/05/2008  . SYSTOLIC MURMUR 3/00/7622    PAST SURGICAL HISTORY:   Past Surgical History:  Procedure Laterality Date  . BREAST EXCISIONAL BIOPSY Right    benign  . BREAST SURGERY     bx  . CATARACT EXTRACTION Bilateral 2012  . HEMORRHOID SURGERY    . TONSILLECTOMY      SOCIAL HISTORY:   Social History   Socioeconomic History  . Marital status: Single    Spouse name: Not on file  . Number of children: Not on file  . Years of education: Not on file  . Highest education level: Not on file  Occupational History  . Occupation: retired    Comment: Geographical information systems officer  . Financial resource strain: Not on file  . Food insecurity:    Worry: Not on file    Inability: Not on file  . Transportation needs:    Medical: Not on file    Non-medical: Not on file  Tobacco Use  . Smoking status: Former Smoker    Last attempt to quit: 03/27/1995    Years since quitting: 22.5  . Smokeless tobacco: Never Used  Substance and Sexual Activity  . Alcohol use: No    Alcohol/week: 0.0 oz  . Drug use: No  . Sexual activity: Not on file  Lifestyle  . Physical activity:    Days per week: Not on file    Minutes per session: Not on file  . Stress: Not on file  Relationships  . Social connections:    Talks on phone: Not on file    Gets together: Not on file      Attends religious service: Not on file    Active member of club or organization: Not on file    Attends meetings of clubs or organizations: Not on file    Relationship status: Not on file  . Intimate partner violence:    Fear of current or ex partner: Not on file    Emotionally abused: Not on file    Physically abused: Not on file    Forced sexual activity: Not on file  Other Topics Concern  . Not on file  Social History Narrative  . Not on file    FAMILY HISTORY:   Family Status  Relation Name Status  . Mother  Deceased at age 66       ovarian ca  . Father  Deceased at age 52       cad  . Sister  Alive       3, one with breast CA  . Sister  Deceased       64, pancreatic CA, brain tumor  . Brother  Alive       1, CAD  . Brother  Deceased       66  . Child  Alive       2 sons, alive and well    ROS:  Review of Systems  Constitutional: Positive for malaise/fatigue.  Respiratory: Positive for cough and shortness of breath.   Cardiovascular: Negative.   Musculoskeletal: Positive for falls.  Neurological: Positive for dizziness (substantial, although seems worse in the AM before taking meds and better after).  Endo/Heme/Allergies: Negative.     PHYSICAL EXAMINATION:    VITALS:   Vitals:   09/24/17 1452  BP: (!) 144/70  Pulse: 85  SpO2: 94%  Weight: 126 lb 2 oz (57.2 kg)  Height: _0  (1.626 m)    GEN:  The patient appears stated age and is in NAD. HEENT:  Normocephalic, atraumatic.  The mucous membranes are moist. The superficial temporal arteries are without ropiness or tenderness. CV:  RRR Lungs:  CTAB Neck/HEME:  There are no carotid bruits bilaterally.  Neurological examination:  Orientation: The patient is alert and oriented x3. Cranial nerves: There is good facial symmetry. The speech is nonfluent but bulbar and dysarthric. Soft palate rises symmetrically and there is no tongue deviation. Hearing is intact to conversational tone. Sensation:  Sensation is intact to light touch throughout Motor: Strength is 5/5 in the bilateral upper and lower extremities.   Shoulder shrug is equal and symmetric.  There is no pronator drift.  Movement examination: Tone: There is normal tone in the UE/LE Abnormal movements: none Coordination:  There is decremation with RAM's, with any form of RAMS, including alternating supination and pronation of the forearm, hand opening and closing, finger taps, heel taps and toe taps. Gait and Station: The patient is in a WC and we did not attempt ambulation.      Labs:    Chemistry      Component Value Date/Time   NA 142 03/28/2017 1610   K 3.8 03/28/2017 1610   CL 100 03/28/2017 1610   CO2 34 (H) 03/28/2017 1610   BUN 23 03/28/2017 1610   CREATININE 1.16 03/28/2017 1610   CREATININE 1.10 (H) 06/13/2015 1404      Component Value Date/Time   CALCIUM 9.8 03/28/2017 1610   ALKPHOS 80 03/28/2017 1610   AST 16 03/28/2017 1610   ALT 8 03/28/2017 1610   BILITOT 0.5 03/28/2017 1610     Lab Results  Component Value Date   TSH 1.95 03/28/2017      ASSESSMENT/PLAN:  1.  PSP  -She had a dat scan done on 07/05/2015 with abnormal significant bilateral reduction and putamen uptake with activity largely confined to the caudate nuclei bilaterally.  -Continue carbidopa/levodopa 25/100 2/1/1.   -she now has 24 hour per day care  -discussed level of care with them.  DNR forms filled out today and 2 copies given to patient/son.  End of life discussions had.  -met with social work today 2.  Depression  -Currently off of her Lexapro and thinks that she is doing well.  She will let me know if she feels differently. 3.  Mild cognitive impairment  -She had neuropsych testing with Dr. Si Raider on 10/26/2015.  There was evidence of mild cognitive impairment and depression.  There was no evidence of dementia. 4.  Dysphagia  -Modified barium swallow was completed on June 05, 2017.  Mild oropharyngeal dysphagia was  noted.  Dysphagia 3 diet with thin liquids was recommended.  She was coughing in room today.  Moderate to high risk in my opinion for aspiration 5.  F/u 6 months.  Much greater than 50% of this visit was spent in counseling and coordinating care.  Total face to face time:  30 min

## 2017-09-24 ENCOUNTER — Encounter: Payer: Self-pay | Admitting: Neurology

## 2017-09-24 ENCOUNTER — Ambulatory Visit: Payer: Medicare HMO | Admitting: Neurology

## 2017-09-24 VITALS — BP 144/70 | HR 85 | Ht 64.0 in | Wt 126.1 lb

## 2017-09-24 DIAGNOSIS — R1319 Other dysphagia: Secondary | ICD-10-CM

## 2017-09-24 DIAGNOSIS — G231 Progressive supranuclear ophthalmoplegia [Steele-Richardson-Olszewski]: Secondary | ICD-10-CM

## 2017-09-24 DIAGNOSIS — Z515 Encounter for palliative care: Secondary | ICD-10-CM | POA: Diagnosis not present

## 2017-09-24 MED ORDER — CARBIDOPA-LEVODOPA 25-100 MG PO TABS: ORAL_TABLET | ORAL | 1 refills | Status: DC

## 2017-09-30 ENCOUNTER — Encounter: Payer: Self-pay | Admitting: Internal Medicine

## 2017-09-30 ENCOUNTER — Ambulatory Visit (INDEPENDENT_AMBULATORY_CARE_PROVIDER_SITE_OTHER): Payer: Medicare HMO | Admitting: Internal Medicine

## 2017-09-30 VITALS — BP 110/62 | HR 78 | Temp 98.3°F | Wt 128.0 lb

## 2017-09-30 DIAGNOSIS — N3000 Acute cystitis without hematuria: Secondary | ICD-10-CM | POA: Diagnosis not present

## 2017-09-30 DIAGNOSIS — I10 Essential (primary) hypertension: Secondary | ICD-10-CM

## 2017-09-30 DIAGNOSIS — R300 Dysuria: Secondary | ICD-10-CM | POA: Diagnosis not present

## 2017-09-30 DIAGNOSIS — J069 Acute upper respiratory infection, unspecified: Secondary | ICD-10-CM

## 2017-09-30 DIAGNOSIS — I350 Nonrheumatic aortic (valve) stenosis: Secondary | ICD-10-CM | POA: Diagnosis not present

## 2017-09-30 DIAGNOSIS — G231 Progressive supranuclear ophthalmoplegia [Steele-Richardson-Olszewski]: Secondary | ICD-10-CM | POA: Diagnosis not present

## 2017-09-30 LAB — POCT URINALYSIS DIPSTICK
Blood, UA: NEGATIVE
Glucose, UA: NEGATIVE
Ketones, UA: 5
Nitrite, UA: POSITIVE
PROTEIN UA: POSITIVE — AB
Spec Grav, UA: >=1.03 — AB (ref 1.010–1.025)
UROBILINOGEN UA: 0.2 U/dL
pH, UA: 6 (ref 5.0–8.0)

## 2017-09-30 MED ORDER — AMOXICILLIN 500 MG PO CAPS: 500.0000 mg | ORAL_CAPSULE | Freq: Three times a day (TID) | ORAL | 0 refills | Status: DC

## 2017-09-30 NOTE — Patient Instructions (Addendum)
Discontinue hydrochlorothiazide   Take over-the-counter expectorants and cough medications such as  Mucinex DM.  Call if there is no improvement in 5 to 7 days or if  you develop worsening cough, fever, or new symptoms, such as shortness of breath or chest pain.  Take your antibiotic as prescribed until ALL of it is gone, but stop if you develop a rash, swelling, or any side effects of the medication.  Contact our office as soon as possible if  there are side effects of the medication.  Hydrate and Humidify  Drink enough water to keep your urine clear or pale yellow. Staying hydrated will help to thin your mucus.  Use a cool mist humidifier to keep the humidity level in your home above 50%.  Inhale steam for 10-15 minutes, 3-4 times a day or as told by your health care provider. You can do this in the bathroom while a hot shower is running.  Limit your exposure to cool or dry air. Rest  Rest as much as possible.  Sleep with your head raised (elevated).  Make sure to get enough sleep each night.

## 2017-09-30 NOTE — Progress Notes (Signed)
Subjective:    Patient ID: Tiffany [name], female    DOB: 23-Apr-1932, 84 y.o.   MRN: [id number]  HPI  84 year old patient who is seen today for six-month follow-up. She is followed by neurology with a history of PSP and also has a history of moderate aortic stenosis.  Her last 2D echocardiogram was in January with suggested only mild left ear. She has essential hypertension.  Patient's blood pressure has been well controlled on triple therapy   Complaints today include cough of 8 days duration.  Reportedly her urine has been much darker than normal and urine urine specimen revealed pyuria.  Past Medical History:  Diagnosis Date  . ALLERGIC RHINITIS 11/21/2006  . BACK PAIN 12/17/2006  . COLONIC POLYPS, HX OF 11/21/2006  . HYPERTENSION 11/21/2006  . OSTEOPENIA 08/11/2009  . OSTEOPOROSIS 11/21/2006  . Parkinson's disease ([place])   . PEDAL EDEMA 07/05/2008  . SYSTOLIC MURMUR 3/81/8299     Social History   Socioeconomic History  . Marital status: Single    Spouse name: Not on file  . Number of children: Not on file  . Years of education: Not on file  . Highest education level: Not on file  Occupational History  . Occupation: retired    Comment: Geographical information systems officer  . Financial resource strain: Not on file  . Food insecurity:    Worry: Not on file    Inability: Not on file  . Transportation needs:    Medical: Not on file    Non-medical: Not on file  Tobacco Use  . Smoking status: Former Smoker    Last attempt to quit: 03/27/1995    Years since quitting: 22.5  . Smokeless tobacco: Never Used  Substance and Sexual Activity  . Alcohol use: No    Alcohol/week: 0.0 oz  . Drug use: No  . Sexual activity: Not on file  Lifestyle  . Physical activity:    Days per week: Not on file    Minutes per session: Not on file  . Stress: Not on file  Relationships  . Social connections:    Talks on phone: Not on file    Gets together: Not on file    Attends religious service: Not on  file    Active member of club or organization: Not on file    Attends meetings of clubs or organizations: Not on file    Relationship status: Not on file  . Intimate partner violence:    Fear of current or ex partner: Not on file    Emotionally abused: Not on file    Physically abused: Not on file    Forced sexual activity: Not on file  Other Topics Concern  . Not on file  Social History Narrative  . Not on file    Past Surgical History:  Procedure Laterality Date  . BREAST EXCISIONAL BIOPSY Right    benign  . BREAST SURGERY     bx  . CATARACT EXTRACTION Bilateral 2012  . HEMORRHOID SURGERY    . TONSILLECTOMY      History reviewed. No pertinent family history.  Allergies  Allergen Reactions  . Codeine Phosphate     Current Outpatient Medications on File Prior to Visit  Medication Sig Dispense Refill  . AMBULATORY NON FORMULARY MEDICATION Standard Wheelchair DX: G23.1 1 Device 0  . amLODipine (NORVASC) 2.5 MG tablet TAKE 1 TABLET EVERY DAY 90 tablet 3  . aspirin 81 MG tablet Take 81 mg by mouth daily.      Marland Kitchen  atorvastatin (LIPITOR) 10 MG tablet TAKE 1 TABLET EVERY DAY 90 tablet 3  . benazepril (LOTENSIN) 40 MG tablet TAKE 1 TABLET EVERY DAY 90 tablet 1  . Calcium Carbonate (CALTRATE 600) 1500 MG TABS Take by mouth 2 (two) times daily.      . carbidopa-levodopa (SINEMET IR) 25-100 MG tablet TAKE 2 TABLETS IN THE MORNING, 1 TABLET  IN  THE  AFTERNOON AND 1 TABLET IN THE EVENING 360 tablet 1  . Cholecalciferol (VITAMIN D) 1000 UNITS capsule Take 1,000 Units by mouth daily.      . Multiple Vitamin (MULTIVITAMIN) tablet Take 1 tablet by mouth daily.      . Omega-3 Fatty Acids (FISH OIL) 1200 MG CAPS Take 1 capsule by mouth daily.     No current facility-administered medications on file prior to visit.     BP 110/62 (BP Location: Left Arm, Patient Position: Sitting, Cuff Size: Normal)   Pulse 78   Temp 98.3 F (36.8 C) (Oral)   Wt 128 lb (58.1 kg)   SpO2 94%   BMI 21.97  kg/m     Review of Systems  Constitutional: Negative.   HENT: Negative for congestion, dental problem, hearing loss, rhinorrhea, sinus pressure, sore throat and tinnitus.   Eyes: Negative for pain, discharge and visual disturbance.  Respiratory: Positive for cough. Negative for shortness of breath.   Cardiovascular: Negative for chest pain, palpitations and leg swelling.  Gastrointestinal: Negative for abdominal distention, abdominal pain, blood in stool, constipation, diarrhea, nausea and vomiting.  Genitourinary: Negative for difficulty urinating, dysuria, flank pain, frequency, hematuria, pelvic pain, urgency, vaginal bleeding, vaginal discharge and vaginal pain.  Musculoskeletal: Positive for gait problem. Negative for arthralgias and joint swelling.  Skin: Negative for rash.  Neurological: Positive for speech difficulty and weakness. Negative for dizziness, syncope, numbness and headaches.  Hematological: Negative for adenopathy.  Psychiatric/Behavioral: Negative for agitation, behavioral problems and dysphoric mood. The patient is not nervous/anxious.        Objective:   Physical Exam  Constitutional: She is oriented to person, place, and time. She appears well-developed and well-nourished.  Blood pressure 120/70  HENT:  Head: Normocephalic.  Right Ear: External ear normal.  Left Ear: External ear normal.  Mouth/Throat: Oropharynx is clear and moist.  Eyes: Pupils are equal, round, and reactive to light. Conjunctivae and EOM are normal.  Neck: Normal range of motion. Neck supple. No thyromegaly present.  Cardiovascular: Normal rate, regular rhythm and intact distal pulses.  Murmur heard. Grade 3-4 /6 systolic murmur  Pulmonary/Chest: Effort normal and breath sounds normal.  Abdominal: Soft. Bowel sounds are normal. She exhibits no mass. There is no tenderness.  Musculoskeletal: Normal range of motion.  Lymphadenopathy:    She has no cervical adenopathy.  Neurological:  She is alert and oriented to person, place, and time.  Significant dysphasia  Skin: Skin is warm and dry. No rash noted.  Psychiatric: She has a normal mood and affect. Her behavior is normal.          Assessment & Plan:  Viral URI with cough.  Will hydrate and treat symptomatically UTI.  Will treat with amoxicillin PSP.  Follow-up neurology Essential hypertension.  Blood pressure has been very well controlled.  Discontinue hydrochlorothiazide Aortic stenosis.  Mild to moderate degree.  Last 2D echo suggested "mild" aortic stenosis.  Patient certainly has a very significant murmur.  Will consider follow-up 2D echocardiogram in 1 year.  Follow-up cardiology   patient will establish with a new  PCP in 6 months  Marletta Lor

## 2017-10-01 ENCOUNTER — Other Ambulatory Visit: Payer: Self-pay | Admitting: Internal Medicine

## 2017-12-24 DIAGNOSIS — Z9189 Other specified personal risk factors, not elsewhere classified: Secondary | ICD-10-CM

## 2017-12-24 DIAGNOSIS — Z79899 Other long term (current) drug therapy: Secondary | ICD-10-CM | POA: Diagnosis not present

## 2017-12-24 DIAGNOSIS — R600 Localized edema: Secondary | ICD-10-CM | POA: Diagnosis not present

## 2017-12-24 DIAGNOSIS — I10 Essential (primary) hypertension: Secondary | ICD-10-CM | POA: Diagnosis not present

## 2017-12-24 DIAGNOSIS — R011 Cardiac murmur, unspecified: Secondary | ICD-10-CM | POA: Diagnosis not present

## 2017-12-24 DIAGNOSIS — R05 Cough: Secondary | ICD-10-CM | POA: Diagnosis not present

## 2017-12-24 DIAGNOSIS — Z885 Allergy status to narcotic agent status: Secondary | ICD-10-CM | POA: Diagnosis not present

## 2017-12-24 DIAGNOSIS — R0602 Shortness of breath: Secondary | ICD-10-CM | POA: Diagnosis not present

## 2017-12-24 HISTORY — DX: Other specified personal risk factors, not elsewhere classified: Z91.89

## 2017-12-25 ENCOUNTER — Encounter: Payer: Self-pay | Admitting: Family Medicine

## 2017-12-25 ENCOUNTER — Ambulatory Visit (INDEPENDENT_AMBULATORY_CARE_PROVIDER_SITE_OTHER): Payer: Medicare HMO | Admitting: Family Medicine

## 2017-12-25 VITALS — BP 170/72 | HR 78 | Temp 98.4°F | Resp 16 | Ht 62.5 in | Wt 131.2 lb

## 2017-12-25 DIAGNOSIS — Z23 Encounter for immunization: Secondary | ICD-10-CM | POA: Diagnosis not present

## 2017-12-25 DIAGNOSIS — G231 Progressive supranuclear ophthalmoplegia [Steele-Richardson-Olszewski]: Secondary | ICD-10-CM

## 2017-12-25 DIAGNOSIS — J069 Acute upper respiratory infection, unspecified: Secondary | ICD-10-CM

## 2017-12-25 DIAGNOSIS — J209 Acute bronchitis, unspecified: Secondary | ICD-10-CM

## 2017-12-25 MED ORDER — ATORVASTATIN CALCIUM 10 MG PO TABS: 10.0000 mg | ORAL_TABLET | Freq: Every day | ORAL | 0 refills | Status: DC

## 2017-12-25 MED ORDER — PREDNISONE 20 MG PO TABS: ORAL_TABLET | ORAL | 0 refills | Status: DC

## 2017-12-25 MED ORDER — AZITHROMYCIN 250 MG PO TABS: ORAL_TABLET | ORAL | 0 refills | Status: DC

## 2017-12-25 NOTE — Progress Notes (Signed)
Office Note 12/25/2017  CC:  Chief Complaint  Patient presents with  . Transfer of Care    Previous PCP: Dr. Burnice Logan  . Follow-up    ER visit - Jule Ser    HPI:  Tiffany [name] is a 84 y.o. female who is here to transfer care to me, f/u ED visit from yesterday. She is accompanied by caregiver, Garth Schlatter. Patient's most recent primary MD: Dr. Raliegh Ip at [facility] (retiring). Old records in EPIC/HL EMR were reviewed prior to or during today's visit.  She has progressive supranuclear palsy, followed by Dr. Carles Collet. She went to [facility] ED yesterday for cough.  I reviewed these records today.   CBC, CMET, TnT, BNP, EKG, CXR all normal (GFR 60-->her baseline is jus a bit below this).   Viral URI with cough dx'd.  Last f/u with Dr. Raliegh Ip was about 3 mo ago, all was stable, nothing was changed.  Currently still with clear runny nose, weak cough, lots of clear phlegm production but struggles to get this up out of mouth.  Mild dizzy spells.  Took guaifenacin, dextrometh, and decong combo yesterday.   No cold/cough meds taken today yet.  No SOB.  +rattle but no wheeze. Has never been on inhaler.  Past Medical History:  Diagnosis Date  . ALLERGIC RHINITIS 11/21/2006  . BACK PAIN 12/17/2006  . COLONIC POLYPS, HX OF 11/21/2006  . HYPERTENSION 11/21/2006  . Moderate aortic stenosis   . OSTEOPENIA 08/11/2009  . OSTEOPOROSIS 11/21/2006  . Parkinson's disease ([place])   . PEDAL EDEMA 07/05/2008  . SYSTOLIC MURMUR 06/06/9700    Past Surgical History:  Procedure Laterality Date  . BREAST EXCISIONAL BIOPSY Right    benign  . BREAST SURGERY     bx  . CATARACT EXTRACTION Bilateral 2012  . HEMORRHOID SURGERY    . TONSILLECTOMY    . TRANSTHORACIC ECHOCARDIOGRAM  04/15/2017   EF 55-60%, mild AS (no change from 2017), grd I DD.  Repeat 2 yrs planned.    History reviewed. No pertinent family history.  Social History   Socioeconomic History  . Marital status: Single    Spouse name: Not on  file  . Number of children: Not on file  . Years of education: Not on file  . Highest education level: Not on file  Occupational History  . Occupation: retired    Comment: Geographical information systems officer  . Financial resource strain: Not on file  . Food insecurity:    Worry: Not on file    Inability: Not on file  . Transportation needs:    Medical: Not on file    Non-medical: Not on file  Tobacco Use  . Smoking status: Former Smoker    Last attempt to quit: 03/27/1995    Years since quitting: 22.7  . Smokeless tobacco: Never Used  Substance and Sexual Activity  . Alcohol use: No    Alcohol/week: 0.0 standard drinks  . Drug use: No  . Sexual activity: Not on file  Lifestyle  . Physical activity:    Days per week: Not on file    Minutes per session: Not on file  . Stress: Not on file  Relationships  . Social connections:    Talks on phone: Not on file    Gets together: Not on file    Attends religious service: Not on file    Active member of club or organization: Not on file    Attends meetings of clubs or organizations: Not on file  Relationship status: Not on file  . Intimate partner violence:    Fear of current or ex partner: Not on file    Emotionally abused: Not on file    Physically abused: Not on file    Forced sexual activity: Not on file  Other Topics Concern  . Not on file  Social History Narrative   Widow, 2 sons.   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   Relocated from [place], [place].   Tob: 10 pack-yr hx, quit age 32.   Alcohol: none.   Lives with private caregivers: 24/7 care.    Outpatient Encounter Medications as of 12/25/2017  Medication Sig  . amLODipine (NORVASC) 2.5 MG tablet TAKE 1 TABLET EVERY DAY  . aspirin 81 MG tablet Take 81 mg by mouth daily.    Marland Kitchen atorvastatin (LIPITOR) 10 MG tablet Take 1 tablet (10 mg total) by mouth daily.  . benazepril (LOTENSIN) 40 MG tablet TAKE 1 TABLET EVERY DAY  . Calcium Carbonate (CALTRATE 600) 1500 MG TABS  Take by mouth 2 (two) times daily.    . carbidopa-levodopa (SINEMET IR) 25-100 MG tablet TAKE 2 TABLETS IN THE MORNING, 1 TABLET  IN  THE  AFTERNOON AND 1 TABLET IN THE EVENING  . Cholecalciferol (VITAMIN D) 1000 UNITS capsule Take 1,000 Units by mouth daily.    . Multiple Vitamin (MULTIVITAMIN) tablet Take 1 tablet by mouth daily.    . Omega-3 Fatty Acids (FISH OIL) 1200 MG CAPS Take 1 capsule by mouth daily.  . [DISCONTINUED] atorvastatin (LIPITOR) 10 MG tablet TAKE 1 TABLET EVERY DAY  . AMBULATORY NON FORMULARY MEDICATION Standard Wheelchair DX: G23.1 (Patient not taking: Reported on 12/25/2017)  . amoxicillin (AMOXIL) 500 MG capsule Take 1 capsule (500 mg total) by mouth 3 (three) times daily. (Patient not taking: Reported on 12/25/2017)  . azithromycin (ZITHROMAX) 250 MG tablet 2 tabs po qd x 1d, then 1 tab po qd x 4d  . predniSONE (DELTASONE) 20 MG tablet 2 tabs po qd x 5d   No facility-administered encounter medications on file as of 12/25/2017.     Allergies  Allergen Reactions  . Codeine Phosphate     ROS Review of Systems  Constitutional: Negative for fatigue and fever.  HENT: Positive for rhinorrhea. Negative for congestion and sore throat.   Eyes: Negative for visual disturbance.  Respiratory: Positive for cough.   Cardiovascular: Negative for chest pain.  Gastrointestinal: Negative for abdominal pain and nausea.  Genitourinary: Negative for dysuria.  Musculoskeletal: Negative for back pain and joint swelling.  Skin: Negative for rash.  Neurological: Positive for weakness (generalized). Negative for headaches.  Hematological: Negative for adenopathy.    PE;  Initial bp 163/71, repeat manual 170/72. Blood pressure (!) 170/72, pulse 78, temperature 98.4 F (36.9 C), temperature source Oral, resp. rate 16, height 5' 2.5" (1.588 m), weight 131 lb 4 oz (59.5 kg), SpO2 93 %.  Sitting in [place] Gen: alert, pleasant, O x 4.  Significant dysphagia with slow speech. TKZ:SWFU: no  injection, icteris, swelling, or exudate.  EOMI, PERRLA. Mouth: lips without lesion/swelling.  Oral mucosa pink and moist. Oropharynx without erythema, exudate, or swelling.  Neck: a couple of small ant cerv LN's, rubbery and non tender. CV: RRR, 4-6 systolic murmur, crescendo-decrescendo, S1 obscured, S2 ok, particularly in Aortic valve area. No rub/gallop. LUNGS: diffuse insp/exp rhonchi that clear with cough for the most part, aeration is decent.  Exp phase not prolonged.  Nonlabored resps. EXT: no clubbing or  cyanosis.  no edema.   Pertinent labs:  Lab Results  Component Value Date   TSH 1.95 03/28/2017   Lab Results  Component Value Date   WBC 9.3 03/28/2017   HGB 13.7 03/28/2017   HCT 40.5 03/28/2017   MCV 90.4 03/28/2017   PLT 203.0 03/28/2017   Lab Results  Component Value Date   CREATININE 1.16 03/28/2017   BUN 23 03/28/2017   NA 142 03/28/2017   K 3.8 03/28/2017   CL 100 03/28/2017   CO2 34 (H) 03/28/2017   Lab Results  Component Value Date   ALT 8 03/28/2017   AST 16 03/28/2017   ALKPHOS 80 03/28/2017   BILITOT 0.5 03/28/2017   Lab Results  Component Value Date   CHOL 135 03/28/2017   Lab Results  Component Value Date   HDL 63.10 03/28/2017   Lab Results  Component Value Date   LDLCALC 46 03/28/2017   Lab Results  Component Value Date   TRIG 129.0 03/28/2017   Lab Results  Component Value Date   CHOLHDL 2 03/28/2017     ASSESSMENT AND PLAN:   New pt; no old records needed.  1) URI with cough, suspect acute bronchitis. She has significant weakness of her cough from her degen neurologic disorder and is at higher risk of bacterial complication. Z pack eRx'd. Additionally, I rx'd prednisone 40mg  qd x 5d. Change to plain mucinex q12. Avoid decongestants or cough suppressants.  2) HTN: bp elevated here. Caregiver says her bp is usually more near normal. Will not change anything regarding bp regimen at this time, but will have caregivers  check her bp daily and we'll make a change if bp still consistently up when I see her for f/u in office in 1 wk.  3) PSP: followed by Dr. Carles Collet, is on sinemet IR.  An After Visit Summary was printed and given to the patient.  Return in about 1 week (around 01/01/2018) for f/u URI/bronchitis.  Signed:  Crissie Sickles, MD           12/25/2017

## 2018-01-01 ENCOUNTER — Encounter: Payer: Self-pay | Admitting: Family Medicine

## 2018-01-01 ENCOUNTER — Ambulatory Visit (HOSPITAL_BASED_OUTPATIENT_CLINIC_OR_DEPARTMENT_OTHER)
Admission: RE | Admit: 2018-01-01 | Discharge: 2018-01-01 | Disposition: A | Discharge status: Home / Self Care | Payer: Medicare HMO | Source: Ambulatory Visit | Attending: Family Medicine | Admitting: Family Medicine

## 2018-01-01 ENCOUNTER — Ambulatory Visit (INDEPENDENT_AMBULATORY_CARE_PROVIDER_SITE_OTHER): Payer: Medicare HMO | Admitting: Family Medicine

## 2018-01-01 VITALS — BP 182/83 | HR 77 | Temp 98.5°F | Resp 16 | Ht 62.5 in | Wt 131.2 lb

## 2018-01-01 DIAGNOSIS — Z9189 Other specified personal risk factors, not elsewhere classified: Secondary | ICD-10-CM

## 2018-01-01 DIAGNOSIS — R05 Cough: Secondary | ICD-10-CM | POA: Diagnosis not present

## 2018-01-01 DIAGNOSIS — J180 Bronchopneumonia, unspecified organism: Secondary | ICD-10-CM | POA: Diagnosis not present

## 2018-01-01 DIAGNOSIS — G231 Progressive supranuclear ophthalmoplegia [Steele-Richardson-Olszewski]: Secondary | ICD-10-CM

## 2018-01-01 DIAGNOSIS — I10 Essential (primary) hypertension: Secondary | ICD-10-CM

## 2018-01-01 DIAGNOSIS — J9811 Atelectasis: Secondary | ICD-10-CM | POA: Diagnosis not present

## 2018-01-01 DIAGNOSIS — M4854XA Collapsed vertebra, not elsewhere classified, thoracic region, initial encounter for fracture: Secondary | ICD-10-CM | POA: Insufficient documentation

## 2018-01-01 DIAGNOSIS — M40205 Unspecified kyphosis, thoracolumbar region: Secondary | ICD-10-CM | POA: Insufficient documentation

## 2018-01-01 LAB — COMPREHENSIVE METABOLIC PANEL
ALBUMIN: 4 g/dL (ref 3.5–5.2)
ALT: 10 U/L (ref 0–35)
AST: 16 U/L (ref 0–37)
Alkaline Phosphatase: 71 U/L (ref 39–117)
BUN: 22 mg/dL (ref 6–23)
CALCIUM: 10.2 mg/dL (ref 8.4–10.5)
CHLORIDE: 98 meq/L (ref 96–112)
CO2: 38 mEq/L — ABNORMAL HIGH (ref 19–32)
Creatinine, Ser: 0.97 mg/dL (ref 0.40–1.20)
GFR: 58.24 mL/min — AB (ref >60.00)
Glucose, Bld: 93 mg/dL (ref 70–99)
POTASSIUM: 4.4 meq/L (ref 3.5–5.1)
Sodium: 140 mEq/L (ref 135–145)
Total Bilirubin: 0.5 mg/dL (ref 0.2–1.2)
Total Protein: 7.1 g/dL (ref 6.0–8.3)

## 2018-01-01 LAB — CBC WITH DIFFERENTIAL/PLATELET
BASOS PCT: 0.4 % (ref 0.0–3.0)
Basophils Absolute: 0 10*3/uL (ref 0.0–0.1)
EOS PCT: 1.3 % (ref 0.0–5.0)
Eosinophils Absolute: 0.2 10*3/uL (ref 0.0–0.7)
HCT: 43 % (ref 36.0–46.0)
Hemoglobin: 14.4 g/dL (ref 12.0–15.0)
LYMPHS ABS: 2.5 10*3/uL (ref 0.7–4.0)
LYMPHS PCT: 21.7 % (ref 12.0–46.0)
MCHC: 33.5 g/dL (ref 30.0–36.0)
MCV: 87.4 fl (ref 78.0–100.0)
MONO ABS: 0.7 10*3/uL (ref 0.1–1.0)
MONOS PCT: 5.7 % (ref 3.0–12.0)
NEUTROS ABS: 8.2 10*3/uL — AB (ref 1.4–7.7)
Neutrophils Relative %: 70.9 % (ref 43.0–77.0)
Platelets: 215 10*3/uL (ref 150.0–400.0)
RBC: 4.92 Mil/uL (ref 3.87–5.11)
RDW: 13.6 % (ref 11.5–15.5)
WBC: 11.5 10*3/uL — AB (ref 4.0–10.5)

## 2018-01-01 MED ORDER — LEVOFLOXACIN 500 MG PO TABS: 500.0000 mg | ORAL_TABLET | Freq: Every day | ORAL | 0 refills | Status: DC

## 2018-01-01 MED ORDER — PREDNISONE 20 MG PO TABS: ORAL_TABLET | ORAL | 0 refills | Status: DC

## 2018-01-01 NOTE — Progress Notes (Signed)
OFFICE VISIT  01/01/2018   CC:  Chief Complaint  Patient presents with  . Follow-up    URI/Broncitis     HPI:    Patient is a 84 y.o. Caucasian female, accompanied by a caregiver, with progressive supranuclear palsy who presents for 1 week f/u of acute bronchitis/bronchopneumonia. I treated her fairly aggressively at last visit: Z pack, prednisone 40mg  qd x 5d, mucinex.  Avoid cough suppressants. Says she is no better. Lots of rattling in chest, cough productive of thinnish yellow mucous.  Has a very hard time getting it up to her mouth.  She took all abx and prednisone.  No fever and no SOB.  Worse when eating. She has chronic choking, gagging, cough when eating and currently gets everything thickened.  Last swallowing study swas 05/2017 by Dr. Carles Collet.  No N/V/D or rash.  No SOB or wheezing.    HTN: home bp's in the last week have been in range of 130-160/60-100. No HA, no dizziness.  No focal weakness.    Past Medical History:  Diagnosis Date  . ALLERGIC RHINITIS 11/21/2006  . BACK PAIN 12/17/2006  . COLONIC POLYPS, HX OF 11/21/2006  . HYPERTENSION 11/21/2006  . Moderate aortic stenosis   . OSTEOPENIA 08/11/2009  . OSTEOPOROSIS 11/21/2006  . PEDAL EDEMA 07/05/2008  . Progressive supranuclear palsy ([place])   . SYSTOLIC MURMUR 3/84/6659    Past Surgical History:  Procedure Laterality Date  . BREAST EXCISIONAL BIOPSY Right    benign  . BREAST SURGERY     bx  . CATARACT EXTRACTION Bilateral 2012  . HEMORRHOID SURGERY    . TONSILLECTOMY    . TRANSTHORACIC ECHOCARDIOGRAM  04/15/2017   EF 55-60%, mild AS (no change from 2017), grd I DD.  Repeat 2 yrs planned.    Outpatient Medications Prior to Visit  Medication Sig Dispense Refill  . amLODipine (NORVASC) 2.5 MG tablet TAKE 1 TABLET EVERY DAY 90 tablet 3  . aspirin 81 MG tablet Take 81 mg by mouth daily.      Marland Kitchen atorvastatin (LIPITOR) 10 MG tablet Take 1 tablet (10 mg total) by mouth daily. 14 tablet 0  . benazepril (LOTENSIN)  40 MG tablet TAKE 1 TABLET EVERY DAY 90 tablet 3  . Calcium Carbonate (CALTRATE 600) 1500 MG TABS Take by mouth 2 (two) times daily.      . carbidopa-levodopa (SINEMET IR) 25-100 MG tablet TAKE 2 TABLETS IN THE MORNING, 1 TABLET  IN  THE  AFTERNOON AND 1 TABLET IN THE EVENING 360 tablet 1  . Cholecalciferol (VITAMIN D) 1000 UNITS capsule Take 1,000 Units by mouth daily.      . Multiple Vitamin (MULTIVITAMIN) tablet Take 1 tablet by mouth daily.      . Omega-3 Fatty Acids (FISH OIL) 1200 MG CAPS Take 1 capsule by mouth daily.    . AMBULATORY NON FORMULARY MEDICATION Standard Wheelchair DX: G23.1 (Patient not taking: Reported on 12/25/2017) 1 Device 0  . amoxicillin (AMOXIL) 500 MG capsule Take 1 capsule (500 mg total) by mouth 3 (three) times daily. (Patient not taking: Reported on 12/25/2017) 30 capsule 0  . azithromycin (ZITHROMAX) 250 MG tablet 2 tabs po qd x 1d, then 1 tab po qd x 4d (Patient not taking: Reported on 01/01/2018) 6 tablet 0  . predniSONE (DELTASONE) 20 MG tablet 2 tabs po qd x 5d (Patient not taking: Reported on 01/01/2018) 10 tablet 0   No facility-administered medications prior to visit.     Allergies  Allergen  Reactions  . Codeine Phosphate     ROS As per HPI  PE: Blood pressure (!) 182/83, pulse 77, temperature 98.5 F (36.9 C), temperature source Oral, resp. rate 16, height 5' 2.5" (1.588 m), weight 131 lb 4 oz (59.5 kg), SpO2 95 %. Gen: sitting up in [place], in NAD.  Alert, oriented x 4. JKK:XFGH: no injection, icteris, swelling, or exudate.  EOMI, PERRLA. Mouth: lips without lesion/swelling.  Oral mucosa pink and moist. Oropharynx without erythema, exudate, or swelling.  CV: RRR, 8-2/9 harsh systolic murmur, no diastolic murmur.  No r/g. Chest is clear, no wheezing or rales. Normal symmetric air entry throughout both lung fields. No chest wall deformities or tenderness. She has wet/rattling cough fairly frequently.  Sounds like excessive secretions in back of  throat. ABD: soft, NT/ND EXT: no clubbing or cyanosis.  no edema.     LABS:    Chemistry      Component Value Date/Time   NA 142 03/28/2017 1610   K 3.8 03/28/2017 1610   CL 100 03/28/2017 1610   CO2 34 (H) 03/28/2017 1610   BUN 23 03/28/2017 1610   CREATININE 1.16 03/28/2017 1610   CREATININE 1.10 (H) 06/13/2015 1404      Component Value Date/Time   CALCIUM 9.8 03/28/2017 1610   ALKPHOS 80 03/28/2017 1610   AST 16 03/28/2017 1610   ALT 8 03/28/2017 1610   BILITOT 0.5 03/28/2017 1610       IMPRESSION AND PLAN:  1) Acute bronchopneumonia. Underlying neurologic condition (progressive supranuclear palsy) is complicating this: she can't mobilize secretions well, also high risk of aspirating. Will check CBC w/diff, CMET, and CXR today. Will treat with levaquin 500 mg qd x 7d and prednisone 40 qd x 5 and then 20 qd x 5. Start mucinex PLAIN: no decongestant or cough suppressant.  2) HTN: not well controlled, current illness no doubt contributing. Increase amlodipine to 5mg  qd. Continue home bp monitoring.  An After Visit Summary was printed and given to the patient.  FOLLOW UP: Return in about 1 week (around 01/08/2018) for f/u bronchopneumonia/HTN (30 min).  Signed:  Crissie Sickles, MD           01/01/2018

## 2018-01-01 NOTE — Patient Instructions (Signed)
Start "plain" mucinex.  Increase your amlodipine to TWO of the 2.5mg  tabs once a day.

## 2018-01-02 ENCOUNTER — Telehealth: Payer: Self-pay | Admitting: Neurology

## 2018-01-02 ENCOUNTER — Telehealth: Payer: Self-pay | Admitting: Family Medicine

## 2018-01-02 DIAGNOSIS — Z9189 Other specified personal risk factors, not elsewhere classified: Secondary | ICD-10-CM

## 2018-01-02 DIAGNOSIS — G231 Progressive supranuclear ophthalmoplegia [Steele-Richardson-Olszewski]: Secondary | ICD-10-CM

## 2018-01-02 MED ORDER — PANTOPRAZOLE SODIUM 40 MG PO TBEC: 40.0000 mg | DELAYED_RELEASE_TABLET | Freq: Every day | ORAL | 3 refills | Status: DC

## 2018-01-02 NOTE — Telephone Encounter (Signed)
-----   Message from Elenora Fender sent at 01/02/2018 12:34 PM EDT ----- Regarding: Mutual Patient Contact: [phone] Hi Dr. Alanda Amass from Dr. Idelle Leech office left a message that he is needing to speak  with you regarding this mutual patient.  Thanks [employer]

## 2018-01-02 NOTE — Telephone Encounter (Signed)
Pls notify pt's caregiver that I talked with Dr. Carles Collet today, and she does agree with referring [place] to a pulmonologist (lung specialist) so they can help over the long term with issues regarding her lungs. I'll order referral to Dr. Halford Chessman at [facility] pulmonology. Also, I want her to start taking a daily medication for acid reflux. All the gagging, and hacking and coughing she is doing lately is most likely causing stomach acid to go up into the back of her throat and this makes her feel even more full of fluid in back of throat. I'll send in rx and she needs to take the med every morning.-thx

## 2018-01-02 NOTE — Telephone Encounter (Signed)
Spoke with Dr. Anitra Lauth.  He didn't think that issue was in lungs but having more trouble with swallow and clearing secretions.  Asked about pulm referral.  Told him I think completely reasonable and would agree, although what they will have to offer may be limited.  Asked about chest physiotherapy.  I don't think it would help but certainly reasonable to try.

## 2018-01-02 NOTE — Telephone Encounter (Signed)
Patient's son notified and verbalized understanding. He does prefer pulmonologist in [place] area.

## 2018-01-02 NOTE — Telephone Encounter (Signed)
I strongly suggest the pulmonologist with [facility] [place], mainly b/c he has consistently been open to seeing all of Dr. Doristine Devoid patients with the diagnosis of PSP.  Also, with the [facility] system, we all share the same electronic medical record system and keeping up with the status of pt's care, testing, consult notes, and communication among MD's is VERY easy and important. If she saw a [facility] pulmonologist, this would not be the case. Let me know what the caregivers say.-thx

## 2018-01-02 NOTE — Telephone Encounter (Signed)
Tried to return call but rings for long time and then goes to unidentified VM and I didn't feel comfortable leaving message on it.  Tiffany [name], will you call dr office when we are not seeing patients and try to get physician on the phone for me?

## 2018-01-03 NOTE — Telephone Encounter (Signed)
Detailed message left on caregivers voice mail.

## 2018-01-08 ENCOUNTER — Encounter: Payer: Self-pay | Admitting: Family Medicine

## 2018-01-08 ENCOUNTER — Ambulatory Visit (INDEPENDENT_AMBULATORY_CARE_PROVIDER_SITE_OTHER): Payer: Medicare HMO | Admitting: Family Medicine

## 2018-01-08 ENCOUNTER — Other Ambulatory Visit: Payer: Self-pay

## 2018-01-08 VITALS — BP 143/70 | HR 96 | Temp 98.4°F | Resp 14 | Ht 62.5 in | Wt 134.0 lb

## 2018-01-08 DIAGNOSIS — G231 Progressive supranuclear ophthalmoplegia [Steele-Richardson-Olszewski]: Secondary | ICD-10-CM | POA: Diagnosis not present

## 2018-01-08 DIAGNOSIS — R05 Cough: Secondary | ICD-10-CM | POA: Diagnosis not present

## 2018-01-08 DIAGNOSIS — R058 Other specified cough: Secondary | ICD-10-CM

## 2018-01-08 DIAGNOSIS — R1110 Vomiting, unspecified: Secondary | ICD-10-CM

## 2018-01-08 DIAGNOSIS — I10 Essential (primary) hypertension: Secondary | ICD-10-CM | POA: Diagnosis not present

## 2018-01-08 MED ORDER — AMLODIPINE BESYLATE 10 MG PO TABS: 10.0000 mg | ORAL_TABLET | Freq: Every day | ORAL | 0 refills | Status: DC

## 2018-01-08 NOTE — Patient Instructions (Signed)
Increase your amlodipine to 10 mg per day.  Take 1/2 of your prednisone tab once a day for the next 3 days, then stop this med.

## 2018-01-08 NOTE — Progress Notes (Signed)
OFFICE VISIT  01/12/2018   CC:  Chief Complaint  Patient presents with  . Follow-up     HPI:    Patient is a 84 y.o. Caucasian female who presents for 7d f/u acute bronchitis/bronchopneumonia. She was not improved last visit so I put her on another prednisone taper and levaquin. CXR was normal.  CBC and CMET were unremarkable. Her biggest issue is trying to mobilize and expel secretions.  Additionally, her bp was not well controlled last visit so I increased her amlodipine to 5mg  qd.  I also added PPI qd to see if silent GERD was contributing to her secretions.  Interim Hx:   BP has come down some but not into normal range consistently. Prednisone makes her anxious. She is eating fairly well, but still is having some intermittent periods of gag/regurgitation when eating, 1-5 times per meal on average.  We went over several possible therapeutic additions today and I told pt/caregiver that I'd see what her neurologist, Dr. Carles Collet, thinks of them.     Past Medical History:  Diagnosis Date  . ALLERGIC RHINITIS 11/21/2006  . BACK PAIN 12/17/2006  . COLONIC POLYPS, HX OF 11/21/2006  . HYPERTENSION 11/21/2006  . Moderate aortic stenosis   . OSTEOPENIA 08/11/2009  . OSTEOPOROSIS 11/21/2006  . PEDAL EDEMA 07/05/2008  . Progressive supranuclear palsy ([facility])    Dr. Carles Collet is her neurologist.  Dr. Carles Collet plans to do botox injections in her salivary glands (as of late Oct 2019).    Jiles Garter MURMUR 07/02/8117    Past Surgical History:  Procedure Laterality Date  . BREAST EXCISIONAL BIOPSY Right    benign  . BREAST SURGERY     bx  . CATARACT EXTRACTION Bilateral 2012  . HEMORRHOID SURGERY    . TONSILLECTOMY    . TRANSTHORACIC ECHOCARDIOGRAM  04/15/2017   EF 55-60%, mild AS (no change from 2017), grd I DD.  Repeat 2 yrs planned.    Outpatient Medications Prior to Visit  Medication Sig Dispense Refill  . aspirin 81 MG tablet Take 81 mg by mouth daily.      Marland Kitchen atorvastatin (LIPITOR) 10 MG  tablet Take 1 tablet (10 mg total) by mouth daily. 14 tablet 0  . benazepril (LOTENSIN) 40 MG tablet TAKE 1 TABLET EVERY DAY 90 tablet 3  . Calcium Carbonate (CALTRATE 600) 1500 MG TABS Take by mouth 2 (two) times daily.      . carbidopa-levodopa (SINEMET IR) 25-100 MG tablet TAKE 2 TABLETS IN THE MORNING, 1 TABLET  IN  THE  AFTERNOON AND 1 TABLET IN THE EVENING 360 tablet 1  . Cholecalciferol (VITAMIN D) 1000 UNITS capsule Take 1,000 Units by mouth daily.      . Multiple Vitamin (MULTIVITAMIN) tablet Take 1 tablet by mouth daily.      . Omega-3 Fatty Acids (FISH OIL) 1200 MG CAPS Take 1 capsule by mouth daily.    . pantoprazole (PROTONIX) 40 MG tablet Take 1 tablet (40 mg total) by mouth daily. 30 tablet 3  . amLODipine (NORVASC) 2.5 MG tablet TAKE 1 TABLET EVERY DAY (Patient taking differently: Take 5 mg by mouth daily. ) 90 tablet 3  . levofloxacin (LEVAQUIN) 500 MG tablet Take 1 tablet (500 mg total) by mouth daily. 7 tablet 0  . predniSONE (DELTASONE) 20 MG tablet 2 tabs po qd x 5d, then 1 tab po qd x 5d 15 tablet 0   No facility-administered medications prior to visit.     Allergies  Allergen  Reactions  . Codeine Phosphate     ROS As per HPI  PE: Blood pressure (!) 143/70, pulse 96, temperature 98.4 F (36.9 C), temperature source Oral, resp. rate 14, height 5' 2.5" (1.588 m), weight 134 lb (60.8 kg), SpO2 96 %. Gen: Alert,  Sitting up in WC, attentive..  Patient is oriented to person, place, time, and situation. CV: RRR, 4/6 systolic murmur--unchanged.  No r/g. EXT: no clubbing or cyanosis.  no edema.     LABS:    Chemistry      Component Value Date/Time   NA 140 01/01/2018 1346   K 4.4 01/01/2018 1346   CL 98 01/01/2018 1346   CO2 38 (H) 01/01/2018 1346   BUN 22 01/01/2018 1346   CREATININE 0.97 01/01/2018 1346   CREATININE 1.10 (H) 06/13/2015 1404      Component Value Date/Time   CALCIUM 10.2 01/01/2018 1346   ALKPHOS 71 01/01/2018 1346   AST 16 01/01/2018 1346    ALT 10 01/01/2018 1346   BILITOT 0.5 01/01/2018 1346     Lab Results  Component Value Date   WBC 11.5 (H) 01/01/2018   HGB 14.4 01/01/2018   HCT 43.0 01/01/2018   MCV 87.4 01/01/2018   PLT 215.0 01/01/2018     IMPRESSION AND PLAN:  1) Cough, suspect this has all been upper airway cough lately. No further abx.  Ween down steroids.  She'll pick up her pantoprazole today and start this med.  2) HTN: not well controlled; increase amlodipine to 10mg  qd.  3) PSP: will ask Dr. Carles Collet about the possibility of treating/eval laryngospasm---potential cause of her cough. ?Using cough suppressant OK?  Glycopyrrolate ok?   ? Low dose Benzo ok? Her next f/u appt with Dr. Carles Collet is 02/25/18.  Spent 30 min with pt today, with >50% of this time spent in counseling and care coordination regarding the above problems.  An After Visit Summary was printed and given to the patient.  FOLLOW UP: Return in about 2 weeks (around 01/22/2018) for f/u HTN/oral secretions.  Signed:  Crissie Sickles, MD           01/12/2018

## 2018-01-09 ENCOUNTER — Encounter: Payer: Self-pay | Admitting: Family Medicine

## 2018-01-09 ENCOUNTER — Telehealth: Payer: Self-pay | Admitting: *Deleted

## 2018-01-09 MED ORDER — CLONAZEPAM 0.5 MG PO TABS: ORAL_TABLET | ORAL | 0 refills | Status: DC

## 2018-01-09 NOTE — Telephone Encounter (Signed)
Please advise. Thanks.  

## 2018-01-09 NOTE — Telephone Encounter (Signed)
Copied from [place] [phone]. Topic: General - Other >> Jan 09, 2018  3:35 PM Keene Breath wrote: Reason for CRM: Patient's daughter-n-law called to request a prescription for patient to relieve anxiety.  Doctor saw patient recently and patient did not want anything for the anxiety.  Now, the patient would like to get something to relieve the anxiety.  Please advise.  Please call daughter-n-law with any questions, Stanton Kidney at [phone]

## 2018-01-09 NOTE — Telephone Encounter (Signed)
Mary pts daughter in law advised and voiced understanding.

## 2018-01-09 NOTE — Telephone Encounter (Signed)
OK, I just eRx'd clonazepam. Tell caregiver that slight sedation may occur when patient first starts taking this med, but after a week or so this effect should dissipate quite a bit.

## 2018-01-10 NOTE — Progress Notes (Signed)
Tiffany [name] Plumber was seen today in the movement disorders clinic for neurologic consultation at the request of McGowen, Adrian Blackwater, MD.  The patient is seen today in neurologic consultation for the subacute evaluation of gait and speech changes for the last several months.  Looking back, she thinks that her handwriting began to change after Thanksgiving some time.  She remembers it being difficult to write her cards at thanksgiving, as the handwriting was smaller and less fluid.  She thinks that she began to have difficulty with word finding about 2 months ago.  She presented to the emergency room at [facility] in [place] on February 27 and I reviewed those records.  She had an MRI of the brain there and I do not have the films, but I do have the report.  It was nonacute and only revealed mild white matter changes.  She was told to follow up with ENT.  She did not have an ENT consult but she did follow up with her primary care physician on 06/03/2015 and was set up with a speech therapy evaluation, cardiology follow-up and was referred here.  She had a carotid ultrasound on 06/06/2015 which just demonstrated 1-39% stenosis.  I reviewed her cardiology follow-up from 06/09/2015 and they did not think that her new symptoms were related to her aortic stenosis.  Her repeat echocardiogram is pending.  I did get a note from her speech therapist and reviewed their records as well.  It was felt that she had both dysphasia as well as dysphagia and a modified barium swallow as well as traditional speech, occupational and physical therapy are being recommended.  Denies muscles jumping.  Is having trouble swallowing.  Only new medication is Lipitor and she started that a few weeks ago.    07/15/15 update:  The patient follows up today after having multiple tests performed.  She had an MRI of the cervical spine on April, 2017 that demonstrated degenerative changes and central canal stenosis and marked bilateral neural  foraminal stenosis at the C4-C5 level and C5-C6 level and significant left neural foraminal stenosis at the C3-C4 level.  She had an EMG performed that was normal, without evidence of motor neuron disease.  She had a modified barium swallow done on 06/29/2015.  There was moderate pharyngeal phase dysphagia.  Regular diet with thin liquids was recommended.  A chin tuck maneuver was helpful.  Home meds with pure was also recommended.  She had a dat scan done on 07/05/2015 with abnormal significant bilateral reduction and putamen uptake with activity largely confined to the caudate nuclei bilaterally.  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  She has been in physical therapy, speech therapy and occupational therapy consistently sent April 25.  Last visit, I started her on levodopa, 100 mg 3 times per day.  She thinks that she is doing better.    She has had 2 falls since her last visit. With one, she went to the bathroom in the middle of the night and fell backward getting in the bed.  She hurt her L shoulder especially when she lifts it.  Physical therapist did recommend a Rollator and I gave her a prescription for that.  I noted that her primary care physician has given her a handicap placard.  She was supposed to have neuropsych testing done but she didn't call to schedule it when messages were left.  She is not driving and relying on friends for transportation.  Feels that swallowing is better.  Having some monocular diplopia that is vertical.    12/22/15 update:  The patient follows up today.  She is accompanied by her daughter in law who supplements the history.  She is on carbidopa/levodopa 25/100, one tablet 3 times per day (5:30am/11am/5pm).  She does not think that the medication is helping.  No SE with the medication.  She had 1 fall at her daughter in laws house getting out of the bed.  Didn't get hurt.  Family helped her up.  No lightheadedness or near syncope.  No  hallucinations.  She does not drive.  She does continue to have some diplopia.  She had neuropsych testing with Dr. Si Raider on 10/26/2015.  There was evidence of mild cognitive impairment and depression.  Pt denies any depression.  There was no evidence of dementia.  Lives alone but staying with her family m-w and then son takes her to church on wed night and daughter in law comes thurs.  She is alone Friday night.  Son comes to her house some Saturday and sundays, her other son takes her to church.  Family concerned about her being alone but pt absolutely refuses to leave home or let someone come in.  No swallowing issues or problems with choking.  Daughter in law states that she has had some choking.  One choking episode in a restaurant on liquid this past week.    03/29/16 update:  Pt is on carbidopa/levodopa 25/100 which was increased last visit to 2 tablets in the morning, one in the afternoon and one in the evening.  [facility] and was following with the patient and I reviewed their notes.  She did go for her therapy screen, but ultimately requested that she have home health instead.  A referral was placed for this just 2 days ago.  She has some coughing episodes and I got a note from [facility] yesterday asking for an order for a repeat MBE.  Coughing is mostly with liquids.  No falls.  Uses walker some of time but admits not all of the time.  Lots of blurry vision but no diplopia but noting change.  She is not driving.  She is preparing her own pill box without trouble.  Notes trouble with putting on pants.  Admits to depression  07/25/16 update: Patient seen today in follow-up, accompanied by her son who supplements the history.  She is on carbidopa/levodopa 25/100, 2 tablets in the morning, one in the afternoon and when the evening.  Last visit, Lexapro, 10 mg was added.  We called her about 6 weeks after we started it and she said it didn't help so we increased it to 20 mg.  States that she d/c lexapro because "it wasn't  doing me any good."  States that she feels good off of it.   she had a modified barium swallow on 04/10/2016.  This demonstrated mild oral phrase and moderate pharyngeal phase dysphagia.  Dysphagia 3 diet with nectar thick liquids was recommended.  Meals on wheels is coming to the house.  Caregiver comes by for an hour 5 days per week to make sure patient is okay.     11/29/16 update:  Patient seen today in follow-up for her progressive supranuclear palsy.  She is accompanied by her son who supplements the history.  She remains on carbidopa/levodopa 25/100, 2 tablets in the morning, one in the afternoon and one in the evening.    She denies choking.  She  did have a swallowing study earlier this year which recommended dysphagia 3 diet with nectar thick liquids.  She denies following this.  She does have Meals on Wheels.  She lives alone.  She has a caregiver 5 days per week, but only for an hour per day.  She thinks that she is doing well with this level of caregiving.  Her son thinks that perhaps it is time for more caregiving.  Reports several falls since last visit.  With one fall she had the walker and fell backward.  With the others, she didn't have the walker  05/24/17 update: Patient is seen today back in follow-up for her PSP.  She is accompanied by her son, Leonor Liv, who supplements the history (last visit it was her son Liliane Channel).  She is on carbidopa/levodopa 25/100, 2 tablets in the morning, 1 in the afternoon and 1 in the evening.  She has had no falls.  She has had no choking spells, but notes that she coughs a lot with eating, as does her son.  I had trouble ascertaining if she was still using the thickener (she uses "yes" to mean "no" at times and vice versa).  Last visit, I spent a significant amount time with the patient and her son discussing her living situation and they were going to try to come up with other options for long-term feasibility.  Patient does report that she has a caregiver at night, 7  days/week.  She is alone during the day.  She has had multiple falls, including one fall in the shower.  With the fall in the shower, her caregiver was present.  09/24/17 update: Patient is seen today in follow-up for PSP.  She is accompanied by her son who supplements the history.  The patient is on carbidopa/levodopa 25/100, 2 tablets in the morning, 1 in the afternoon and 1 in the evening.  Modified barium swallow was completed on June 05, 2017.  Mild oropharyngeal dysphagia was noted.  Dysphagia 3 diet with thin liquids was recommended.    Records have been reviewed since our last visit.  She was seen by primary care in the middle of May.  She had a fall at the end of April (was Easter Sunday) and landed on her right shoulder.  She initially didn't tell the family until she realized she got hurt.   She was given a sling and told to apply ice or heat.  After that the family hired 24/7 caregiving.  There are 5 ladies coming into the home and visiting Angels come into the home.  They have the application for [employer] if they need that.    01/13/18 update: Patient is seen today in follow-up for PSP, accompanied by her caregiver who supplements history.  She was worked in today because of the fact that she was having more difficulties.  I personally discussed the case with her primary care physician.  She is still on carbidopa/levodopa 25/100, 2 tablets in the morning, 1 in the afternoon and 1 in the evening.  She is having more coughing.  More difficulty clearing secretions.  She was in the emergency room on October 1 with cough.  Those notes are reviewed.  Emergency room felt that this was a viral URI.  She was treated as an outpatient with prednisone taper and Levaquin.  Coughing and trouble clearing secretions continues.  She was just started on clonazepam for anxiety.  Even 1/2 tablet giving her hang over effect (only taking q hs).  More dizzy than in the past.  Had to increase amlodipine because of  elevated systolic blood pressures.    ALLERGIES:   Allergies  Allergen Reactions  . Codeine Phosphate     CURRENT MEDICATIONS:  Outpatient Encounter Medications as of 01/13/2018  Medication Sig  . amLODipine (NORVASC) 10 MG tablet Take 1 tablet (10 mg total) by mouth daily.  Marland Kitchen aspirin 81 MG tablet Take 81 mg by mouth daily.    Marland Kitchen atorvastatin (LIPITOR) 10 MG tablet Take 1 tablet (10 mg total) by mouth daily.  . benazepril (LOTENSIN) 40 MG tablet TAKE 1 TABLET EVERY DAY  . Calcium Carbonate (CALTRATE 600) 1500 MG TABS Take by mouth 2 (two) times daily.    . carbidopa-levodopa (SINEMET IR) 25-100 MG tablet TAKE 2 TABLETS IN THE MORNING, 1 TABLET  IN  THE  AFTERNOON AND 1 TABLET IN THE EVENING  . Cholecalciferol (VITAMIN D) 1000 UNITS capsule Take 1,000 Units by mouth daily.    . clonazePAM (KLONOPIN) 0.5 MG tablet 1/2-1 tab po bid prn anxiety  . Multiple Vitamin (MULTIVITAMIN) tablet Take 1 tablet by mouth daily.    . Omega-3 Fatty Acids (FISH OIL) 1200 MG CAPS Take 1 capsule by mouth daily.  . pantoprazole (PROTONIX) 40 MG tablet Take 1 tablet (40 mg total) by mouth daily.   No facility-administered encounter medications on file as of 01/13/2018.     PAST MEDICAL HISTORY:   Past Medical History:  Diagnosis Date  . ALLERGIC RHINITIS 11/21/2006  . BACK PAIN 12/17/2006  . COLONIC POLYPS, HX OF 11/21/2006  . HYPERTENSION 11/21/2006  . Moderate aortic stenosis   . OSTEOPENIA 08/11/2009  . OSTEOPOROSIS 11/21/2006  . PEDAL EDEMA 07/05/2008  . Progressive supranuclear palsy (HCC)    Dr. Carles Collet is her neurologist.  Dr. Carles Collet plans to do botox injections in her salivary glands (as of late Oct 2019).    . SYSTOLIC MURMUR 1/76/1607    PAST SURGICAL HISTORY:   Past Surgical History:  Procedure Laterality Date  . BREAST EXCISIONAL BIOPSY Right    benign  . BREAST SURGERY     bx  . CATARACT EXTRACTION Bilateral 2012  . HEMORRHOID SURGERY    . TONSILLECTOMY    . TRANSTHORACIC ECHOCARDIOGRAM   04/15/2017   EF 55-60%, mild AS (no change from 2017), grd I DD.  Repeat 2 yrs planned.    SOCIAL HISTORY:   Social History   Socioeconomic History  . Marital status: Single    Spouse name: Not on file  . Number of children: Not on file  . Years of education: Not on file  . Highest education level: Not on file  Occupational History  . Occupation: retired    Comment: Geographical information systems officer  . Financial resource strain: Not on file  . Food insecurity:    Worry: Not on file    Inability: Not on file  . Transportation needs:    Medical: Not on file    Non-medical: Not on file  Tobacco Use  . Smoking status: Former Smoker    Last attempt to quit: 03/27/1995    Years since quitting: 22.8  . Smokeless tobacco: Never Used  Substance and Sexual Activity  . Alcohol use: No    Alcohol/week: 0.0 standard drinks  . Drug use: No  . Sexual activity: Not on file  Lifestyle  . Physical activity:    Days per week: Not on file    Minutes per session: Not on file  .  Stress: Not on file  Relationships  . Social connections:    Talks on phone: Not on file    Gets together: Not on file    Attends religious service: Not on file    Active member of club or organization: Not on file    Attends meetings of clubs or organizations: Not on file    Relationship status: Not on file  . Intimate partner violence:    Fear of current or ex partner: Not on file    Emotionally abused: Not on file    Physically abused: Not on file    Forced sexual activity: Not on file  Other Topics Concern  . Not on file  Social History Narrative   Widow, 2 sons.   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   Relocated from [place], [place].   Tob: 10 pack-yr hx, quit age 19.   Alcohol: none.   Lives with private caregivers: 24/7 care.    FAMILY HISTORY:   Family Status  Relation Name Status  . Mother  Deceased at age 78       ovarian ca  . Father  Deceased at age 7       cad  . Sister  Alive        3, one with breast CA  . Sister  Deceased       7, pancreatic CA, brain tumor  . Brother  Alive       1, CAD  . Brother  Deceased       18  . Child  Alive       2 sons, alive and well    ROS:  Review of Systems  Constitutional: Negative.   Eyes: Positive for double vision.  Respiratory: Positive for cough.   Cardiovascular: Negative.   Gastrointestinal: Negative.   Musculoskeletal: Negative.   Skin: Negative.   Endo/Heme/Allergies: Negative.     PHYSICAL EXAMINATION:    VITALS:   Vitals:   01/13/18 1423  BP: 130/70  Pulse: 90  SpO2: 93%  Weight: 138 lb 6 oz (62.8 kg)  Height: 5' 2.5" (1.588 m)    GEN:  The patient appears stated age and is in NAD. HEENT:  Normocephalic, atraumatic.  The mucous membranes are moist. The superficial temporal arteries are without ropiness or tenderness. CV:  RRR Lungs:  CTAB Neck/HEME:  There are no carotid bruits bilaterally.  Neurological examination:  Orientation: The patient is alert and oriented x3. Cranial nerves: There is good facial symmetry.  There is significant facial hypomimia.  The patient speaks very little, but when she does it is bulbar, nonfluent and dysarthric.  She confuses yes and no (chronic).   Hearing is intact to conversational tone. Sensation: Sensation is intact to light touch throughout Motor: Strength is at least antigravity x4  Movement examination: Tone: There is normal tone in the UE/LE Abnormal movements: none Coordination:  There is decremation with RAM's, with any form of RAMS, including alternating supination and pronation of the forearm, hand opening and closing, finger taps, heel taps and toe taps. Gait and Station: The patient is in a WC and we did not attempt ambulation.      Labs:    Chemistry      Component Value Date/Time   NA 140 01/01/2018 1346   K 4.4 01/01/2018 1346   CL 98 01/01/2018 1346   CO2 38 (H) 01/01/2018 1346   BUN 22 01/01/2018 1346   CREATININE 0.97 01/01/2018 1346  CREATININE 1.10 (H) 06/13/2015 1404      Component Value Date/Time   CALCIUM 10.2 01/01/2018 1346   ALKPHOS 71 01/01/2018 1346   AST 16 01/01/2018 1346   ALT 10 01/01/2018 1346   BILITOT 0.5 01/01/2018 1346     Lab Results  Component Value Date   TSH 1.95 03/28/2017      ASSESSMENT/PLAN:  1.  PSP  -She had a dat scan done on 07/05/2015 with abnormal significant bilateral reduction and putamen uptake with activity largely confined to the caudate nuclei bilaterally.  -Continue carbidopa/levodopa 25/100 2/1/1.   -she now has 24 hour per day care  -discussed hospice care.  Put in a referral.  Told the patient that her family should be present for the visit. 2.  Depression  -Currently off of her Lexapro and thinks that she is doing well.  She will let me know if she feels differently. 3.  Mild cognitive impairment  -She had neuropsych testing with Dr. Si Raider on 10/26/2015.  There was evidence of mild cognitive impairment and depression.  There was no evidence of dementia. 4.  Dysphagia  -Modified barium swallow was completed on June 05, 2017.  Mild oropharyngeal dysphagia was noted.  Dysphagia 3 diet with thin liquids was recommended.    -Cough is likely due to difficulty clearing secretions.  I think she is a high aspiration risk.  There is generally not that much that can be done in PSP patients, but I do often like them to see pulmonology to see if anything further to offer.  I have talked to her PCP about that and he put in a referral for Dr. Halford Chessman.  I do not see she has an appointment and I will check on that (we called after visit and now has appt this week with Dr. Valeta Harms) 5.  Sialorrhea  -This is commonly associated with PD.  We talked about treatments.  The patient is not a candidate for oral anticholinergic therapy because of increased risk of confusion and falls.  We discussed Botox (type A and B) and 1% atropine drops.  We discusssed that candy like lemon drops can help by  stimulating mm of the oropharynx to induce swallowing.  She will think about it and let me know if she would like to proceed with Botox 6.  Dizziness  -Discussed concept of permissive hypertension.  Amlodipine was recently increased because of uncontrolled hypertension, but we may need to have wider parameters for controlling her blood pressure now.  Sent a message to her primary care physician and appreciate the amount of coordination that he has done with me with this patient. 7.  She has a follow-up in December.  I left that there so we can address all issues that are present and those yet to come.  Much greater than 50% of this visit was spent in counseling and coordinating care.  Total face to face time:  25 min

## 2018-01-13 ENCOUNTER — Telehealth: Payer: Self-pay | Admitting: Family Medicine

## 2018-01-13 ENCOUNTER — Encounter: Payer: Self-pay | Admitting: Neurology

## 2018-01-13 ENCOUNTER — Ambulatory Visit (INDEPENDENT_AMBULATORY_CARE_PROVIDER_SITE_OTHER): Payer: Medicare HMO | Admitting: Neurology

## 2018-01-13 VITALS — BP 130/70 | HR 90 | Ht 62.5 in | Wt 138.4 lb

## 2018-01-13 DIAGNOSIS — K117 Disturbances of salivary secretion: Secondary | ICD-10-CM

## 2018-01-13 DIAGNOSIS — Z515 Encounter for palliative care: Secondary | ICD-10-CM

## 2018-01-13 DIAGNOSIS — G901 Familial dysautonomia [Riley-Day]: Secondary | ICD-10-CM | POA: Diagnosis not present

## 2018-01-13 DIAGNOSIS — G231 Progressive supranuclear ophthalmoplegia [Steele-Richardson-Olszewski]: Secondary | ICD-10-CM | POA: Diagnosis not present

## 2018-01-13 DIAGNOSIS — R42 Dizziness and giddiness: Secondary | ICD-10-CM

## 2018-01-13 NOTE — Patient Instructions (Signed)
1.  We will make a referral to hospice.  Make sure that your family can attend the visit  2.  Let me know if you would like to schedule botox injections for the drooling  3.  You may want to try and cover an eye with eye patch to help the double vision  4.  I will talk to McGowen, Adrian Blackwater, MD about your amlodipine

## 2018-01-13 NOTE — Telephone Encounter (Signed)
Advise caregiver to cut amlodipine dose back to 1/2 of the 10mg  tab once daily if they have not already done this. Dr. Carles Collet, the patient's neurologist, recommended this b/c it is better in this case to let her blood pressure be elevated rather than potentially drop too low.-thx

## 2018-01-14 ENCOUNTER — Telehealth: Payer: Self-pay | Admitting: Psychology

## 2018-01-14 ENCOUNTER — Telehealth: Payer: Self-pay | Admitting: Family Medicine

## 2018-01-14 NOTE — Telephone Encounter (Signed)
SW Cheryl pts caregiver and pt, advised them to cut amlodipine in half, they both voiced understanding.

## 2018-01-14 NOTE — Telephone Encounter (Signed)
Copied from [place] [id number]. Topic: Quick Communication - See Telephone Encounter >> Jan 14, 2018 10:15 AM Berneta Levins wrote: CRM for notification. See Telephone encounter for: 01/14/18.  Jenn from [facility] calling.  States that pt has been referred for [facility] and they want to know if Dr. Anitra Lauth will be the attending of record. Danise Mina can be reached at [phone]

## 2018-01-14 NOTE — Telephone Encounter (Signed)
SW Dr. Anitra Lauth, he is okay with being attending of records.   Jenn with Hospice advised and voiced understanding.

## 2018-01-14 NOTE — Telephone Encounter (Signed)
TC with [facility] and spoke with Delsa Sale to make the Hospice Referral. Confirmed that we are only the referring provider and will not be the attending.  In addition, contacted patient's son to share the information on the hospice referral and to expect a call to schedule a visit for an intake appointment. The family has received the possibility of this service with gratitude and acknowledged a significant decline in the last few months.

## 2018-01-15 ENCOUNTER — Telehealth: Payer: Self-pay | Admitting: Neurology

## 2018-01-15 NOTE — Telephone Encounter (Signed)
Butch Penny from [facility] is calling in wanting to know about the referral sent in for the patient to have botox on her salivary glands. She needed to know if this was going to be covered by the patients traditional medicare. The family is going to hold off on admitting her to hospice until they find out if the botox will be paid because once she entered hospice she will be transferred to the hospice medicare and they will not pay. Please call Butch Penny back at [phone]. OR call the referral center at [phone] if you can not reach her. Thanks!

## 2018-01-15 NOTE — Telephone Encounter (Signed)
Butch Penny with Hosp[facility] calling and states that the patient's Hospice admission has been put on hold. States that Dr Tat is wanting to give botox injection into her salivary glands and this has to be done before being admitted to hospice services for insurance to cover.  CB#: [id number]

## 2018-01-16 ENCOUNTER — Encounter: Payer: Self-pay | Admitting: Pulmonary Disease

## 2018-01-16 ENCOUNTER — Ambulatory Visit: Payer: Medicare HMO | Admitting: Pulmonary Disease

## 2018-01-16 VITALS — BP 150/80 | HR 82 | Wt 135.6 lb

## 2018-01-16 DIAGNOSIS — J986 Disorders of diaphragm: Secondary | ICD-10-CM

## 2018-01-16 DIAGNOSIS — T17908A Unspecified foreign body in respiratory tract, part unspecified causing other injury, initial encounter: Secondary | ICD-10-CM

## 2018-01-16 DIAGNOSIS — R1319 Other dysphagia: Secondary | ICD-10-CM

## 2018-01-16 MED ORDER — ALBUTEROL SULFATE (2.5 MG/3ML) 0.083% IN NEBU: 2.5000 mg | INHALATION_SOLUTION | RESPIRATORY_TRACT | 5 refills | Status: DC | PRN

## 2018-01-16 NOTE — Telephone Encounter (Signed)
myobloc but let me let Janett Billow talk to the family

## 2018-01-16 NOTE — Telephone Encounter (Signed)
LMOM for patient to call back to see what the plan was with Botox.

## 2018-01-16 NOTE — Telephone Encounter (Signed)
FYI

## 2018-01-16 NOTE — Telephone Encounter (Signed)
I don't think that they want to proceed.  It was just offered.

## 2018-01-16 NOTE — Telephone Encounter (Signed)
Noted  

## 2018-01-16 NOTE — Patient Instructions (Addendum)
Thank you for visiting Dr. Valeta Harms at [facility]. Today we recommend the following: Orders Placed This Encounter  Procedures  . Ambulatory Referral for DME   Meds ordered this encounter  Medications  . albuterol (PROVENTIL) (2.5 MG/3ML) 0.083% nebulizer solution    Sig: Take 3 mLs (2.5 mg total) by nebulization every 4 (four) hours as needed for wheezing or shortness of breath.    Dispense:  75 mL    Refill:  5     We are moving our office in November. The new address will be: [address] [employer] [address] Phone: [phone]

## 2018-01-16 NOTE — Progress Notes (Addendum)
Synopsis: Referred in October 2019 for aspiration by Tammi Sou, MD  Subjective:   PATIENT ID: Tiffany [name] GENDER: female DOB: Mar 12, 1933, MRN: [id number]  Chief Complaint  Patient presents with  . Consult    States she is choking, PSP patient, increased salivation.    This is a 84 year old female with a past medical history of progressive supranuclear palsy (PSP).  She was referred from neurology to to her progressive risk for aspiration pneumonia.  Over the past several months she has had progressive dysphasia.  Overall she has been doing well.  She is extremely weak.  She does have a full-time daytime CNA that helps take care of her.  She was recently seen by her neurologist that made recommendations for evaluation by pulmonary medicine.  She has had a modified barium swallow with evaluation by speech pathology in March 2019.  This revealed significant dysphagia and risk for aspiration.  The caregiver does state history consistent with progressive choking and coughing on foods.  They have been trying to help keep her airway clear as she continues to attempt at maintaining nutrition.  She does get progressively tired throughout the day and is very tired today in the office because she was unable to get a nap.  She did get a chance to make it out of the house today to visit the hairdresser.    Past Medical History:  Diagnosis Date  . ALLERGIC RHINITIS 11/21/2006  . At high risk for aspiration 12/2017   Progressive oral pharyngeal dysfunction/dysphagia: pulm recommended daily vest with cough assist device + albuterol nebulized  . BACK PAIN 12/17/2006  . COLONIC POLYPS, HX OF 11/21/2006  . HYPERTENSION 11/21/2006  . Moderate aortic stenosis   . OSTEOPENIA 08/11/2009  . OSTEOPOROSIS 11/21/2006  . PEDAL EDEMA 07/05/2008  . Progressive supranuclear palsy (HCC)    Dr. Carles Collet is her neurologist.  Dr. Carles Collet plans to do botox injections in her salivary glands (as of late Oct 2019).    .  SYSTOLIC MURMUR 7/89/3810     No family history on file.   Past Surgical History:  Procedure Laterality Date  . BREAST EXCISIONAL BIOPSY Right    benign  . BREAST SURGERY     bx  . CATARACT EXTRACTION Bilateral 2012  . HEMORRHOID SURGERY    . TONSILLECTOMY    . TRANSTHORACIC ECHOCARDIOGRAM  04/15/2017   EF 55-60%, mild AS (no change from 2017), grd I DD.  Repeat 2 yrs planned.    Social History   Socioeconomic History  . Marital status: Single    Spouse name: Not on file  . Number of children: Not on file  . Years of education: Not on file  . Highest education level: Not on file  Occupational History  . Occupation: retired    Comment: Geographical information systems officer  . Financial resource strain: Not on file  . Food insecurity:    Worry: Not on file    Inability: Not on file  . Transportation needs:    Medical: Not on file    Non-medical: Not on file  Tobacco Use  . Smoking status: Former Smoker    Last attempt to quit: 03/27/1995    Years since quitting: 22.8  . Smokeless tobacco: Never Used  Substance and Sexual Activity  . Alcohol use: No    Alcohol/week: 0.0 standard drinks  . Drug use: No  . Sexual activity: Not on file  Lifestyle  . Physical activity:    Days  per week: Not on file    Minutes per session: Not on file  . Stress: Not on file  Relationships  . Social connections:    Talks on phone: Not on file    Gets together: Not on file    Attends religious service: Not on file    Active member of club or organization: Not on file    Attends meetings of clubs or organizations: Not on file    Relationship status: Not on file  . Intimate partner violence:    Fear of current or ex partner: Not on file    Emotionally abused: Not on file    Physically abused: Not on file    Forced sexual activity: Not on file  Other Topics Concern  . Not on file  Social History Narrative   Widow, 2 sons.   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   Relocated from [place],  [place].   Tob: 10 pack-yr hx, quit age 89.   Alcohol: none.   Lives with private caregivers: 24/7 care.     Allergies  Allergen Reactions  . Codeine Phosphate      Outpatient Medications Prior to Visit  Medication Sig Dispense Refill  . aspirin 81 MG tablet Take 81 mg by mouth daily.      Marland Kitchen atorvastatin (LIPITOR) 10 MG tablet Take 1 tablet (10 mg total) by mouth daily. 14 tablet 0  . benazepril (LOTENSIN) 40 MG tablet TAKE 1 TABLET EVERY DAY 90 tablet 3  . Calcium Carbonate (CALTRATE 600) 1500 MG TABS Take by mouth 2 (two) times daily.      . carbidopa-levodopa (SINEMET IR) 25-100 MG tablet TAKE 2 TABLETS IN THE MORNING, 1 TABLET  IN  THE  AFTERNOON AND 1 TABLET IN THE EVENING 360 tablet 1  . Cholecalciferol (VITAMIN D) 1000 UNITS capsule Take 1,000 Units by mouth daily.      . clonazePAM (KLONOPIN) 0.5 MG tablet 1/2-1 tab po bid prn anxiety 60 tablet 0  . Multiple Vitamin (MULTIVITAMIN) tablet Take 1 tablet by mouth daily.      . Omega-3 Fatty Acids (FISH OIL) 1200 MG CAPS Take 1 capsule by mouth daily.    . pantoprazole (PROTONIX) 40 MG tablet Take 1 tablet (40 mg total) by mouth daily. 30 tablet 3  . amLODipine (NORVASC) 10 MG tablet Take 1 tablet (10 mg total) by mouth daily. 30 tablet 0   No facility-administered medications prior to visit.     Review of Systems  Constitutional: Negative for chills, fever, malaise/fatigue and weight loss.  HENT: Negative for hearing loss, sore throat and tinnitus.   Eyes: Negative for blurred vision and double vision.  Respiratory: Positive for cough, sputum production and shortness of breath. Negative for hemoptysis, wheezing and stridor.   Cardiovascular: Negative for chest pain, palpitations, orthopnea, leg swelling and PND.  Gastrointestinal: Negative for abdominal pain, constipation, diarrhea, heartburn, nausea and vomiting.  Genitourinary: Negative for dysuria, hematuria and urgency.  Musculoskeletal: Negative for joint pain and  myalgias.  Skin: Negative for itching and rash.  Neurological: Positive for tremors, speech change and weakness. Negative for dizziness, tingling and headaches.  Endo/Heme/Allergies: Negative for environmental allergies. Does not bruise/bleed easily.  Psychiatric/Behavioral: Positive for depression. The patient is nervous/anxious. The patient does not have insomnia.   All other systems reviewed and are negative.    Objective:  Physical Exam  Constitutional: She appears well-developed and well-nourished. No distress.  HENT:  Head: Normocephalic and atraumatic.  Mouth/Throat: Oropharynx is clear and moist.  Eyes: Pupils are equal, round, and reactive to light. Conjunctivae are normal. No scleral icterus.  Neck: Neck supple. No JVD present. No tracheal deviation present.  Cardiovascular: Normal rate, regular rhythm, normal heart sounds and intact distal pulses.  No murmur heard. Pulmonary/Chest: Effort normal and breath sounds normal. No accessory muscle usage or stridor. No tachypnea. No respiratory distress. She has no wheezes. She has no rhonchi. She has no rales.  Abdominal: Soft. Bowel sounds are normal. She exhibits no distension. There is no tenderness.  Musculoskeletal: She exhibits no edema or tenderness.  Lymphadenopathy:    She has no cervical adenopathy.  Neurological: She is alert. She exhibits abnormal muscle tone. Coordination abnormal.  Slow with motor and verbal response.   Skin: Skin is warm and dry. Capillary refill takes less than 2 seconds. No rash noted.  Psychiatric: She has a normal mood and affect. Her behavior is normal.  Vitals reviewed.    Vitals:   01/16/18 1511  BP: (!) 150/80  Pulse: 82  SpO2: 96%  Weight: 135 lb 9.6 oz (61.5 kg)   96% on RA BMI Readings from Last 3 Encounters:  01/16/18 24.41 kg/m  01/13/18 24.91 kg/m  01/08/18 24.12 kg/m   Wt Readings from Last 3 Encounters:  01/16/18 135 lb 9.6 oz (61.5 kg)  01/13/18 138 lb 6 oz (62.8  kg)  01/08/18 134 lb (60.8 kg)     CBC    Component Value Date/Time   WBC 11.5 (H) 01/01/2018 1346   RBC 4.92 01/01/2018 1346   HGB 14.4 01/01/2018 1346   HCT 43.0 01/01/2018 1346   PLT 215.0 01/01/2018 1346   MCV 87.4 01/01/2018 1346   MCHC 33.5 01/01/2018 1346   RDW 13.6 01/01/2018 1346   LYMPHSABS 2.5 01/01/2018 1346   MONOABS 0.7 01/01/2018 1346   EOSABS 0.2 01/01/2018 1346   BASOSABS 0.0 01/01/2018 1346    Chest Imaging: 01/01/2018: IMPRESSION: Minimal bibasilar atelectasis. Marked T12 compression fracture with vertebra plana and focal thoracolumbar kyphosis.  Pulmonary Functions Testing Results: None   FeNO: None   Pathology: None   Echocardiogram: None   Heart Catheterization: None     Assessment & Plan:   Dysphagia, neurologic - Plan: Ambulatory Referral for DME  Aspiration into airway, initial encounter - Plan: Ambulatory Referral for DME  Disorders of diaphragm  Discussion:  This is a very pleasant 84 year old female with a unfortunate degenerative neurologic disorder of progressive supranuclear palsy.  She has significant oral pharyngeal dysphasia and uncoordinated swallowing posing her at severe aspiration risk.  She is unable to mobilize secretions on her own and she is too fragile for traditional chest physiotherapy and percussion.  She has a progressive debilitating productive cough for more than the past 6 months and has been worsening slowly.  She has insufficient expiratory force to generate a cough to clear her airway.  She also does not have the physical motor coordination due to this neurologic disorder to operate a flutter valve with any effectiveness. The patient has chest wall and diaphragmatic impairment limiting her ability to effectively clear and mobilize her pulmonary secretions secondary to supranuclear palsy.   I believe that she would benefit from daily vest therapy as well as a CoughAssist device to help with airway clearance.  We will  also prescribe an albuterol nebulizer and albuterol solution to be used for bronchodilatation to help aid in airway clearance.  Greater than 50% of this patient 60-minute office visit  was spent face-to-face discussing the above treatment plan with the patient as well as the patient's caregiver.   Current Outpatient Medications:  .  aspirin 81 MG tablet, Take 81 mg by mouth daily.  , Disp: , Rfl:  .  atorvastatin (LIPITOR) 10 MG tablet, Take 1 tablet (10 mg total) by mouth daily., Disp: 14 tablet, Rfl: 0 .  benazepril (LOTENSIN) 40 MG tablet, TAKE 1 TABLET EVERY DAY, Disp: 90 tablet, Rfl: 3 .  Calcium Carbonate (CALTRATE 600) 1500 MG TABS, Take by mouth 2 (two) times daily.  , Disp: , Rfl:  .  carbidopa-levodopa (SINEMET IR) 25-100 MG tablet, TAKE 2 TABLETS IN THE MORNING, 1 TABLET  IN  THE  AFTERNOON AND 1 TABLET IN THE EVENING, Disp: 360 tablet, Rfl: 1 .  Cholecalciferol (VITAMIN D) 1000 UNITS capsule, Take 1,000 Units by mouth daily.  , Disp: , Rfl:  .  clonazePAM (KLONOPIN) 0.5 MG tablet, 1/2-1 tab po bid prn anxiety, Disp: 60 tablet, Rfl: 0 .  Multiple Vitamin (MULTIVITAMIN) tablet, Take 1 tablet by mouth daily.  , Disp: , Rfl:  .  Omega-3 Fatty Acids (FISH OIL) 1200 MG CAPS, Take 1 capsule by mouth daily., Disp: , Rfl:  .  pantoprazole (PROTONIX) 40 MG tablet, Take 1 tablet (40 mg total) by mouth daily., Disp: 30 tablet, Rfl: 3 .  albuterol (PROVENTIL) (2.5 MG/3ML) 0.083% nebulizer solution, Take 3 mLs (2.5 mg total) by nebulization every 4 (four) hours as needed for wheezing or shortness of breath. (Patient not taking: Reported on 01/22/2018), Disp: 75 mL, Rfl: 5 .  amLODipine (NORVASC) 10 MG tablet, 1/2 tab po qd, Disp: 30 tablet, Rfl: 0   Garner Nash, DO [facility] 01/30/2018 4:35 PM

## 2018-01-16 NOTE — Telephone Encounter (Signed)
I spoke with Butch Penny, intake nurse with hospice, and patient's son. They would like to proceed with injections. Would you like to use Myobloc of Xeomin?

## 2018-01-16 NOTE — Telephone Encounter (Signed)
Dr. Carles Collet - your note states they will let us know if they want to proceed with Botox. If patient is on Medicare, they will cover this. What do you advice as far as injections with them holding on Hospice referral?

## 2018-01-17 ENCOUNTER — Telehealth: Payer: Self-pay | Admitting: Psychology

## 2018-01-17 NOTE — Telephone Encounter (Signed)
TC with patient's son.  He clarified that the family has decided that they will receive Botox services in lieu of hospice care at this time.  He understands that Botox is not life-sustaining, but feels that this treatment will help her be more  Comfortable. They plan to see Dr. Carles Collet on 11/15 when she gets her treatment.   He stated that when they talked to the hospice nurse that they decided that when Botox is discontinued then they will plan to receive hospice care.  I will call Delsa Sale from hospice and palliative care [facility] and asked that palliative care follow the patient so when this transition happens it will be a little smooth for both the patient and their family.

## 2018-01-17 NOTE — Telephone Encounter (Signed)
Tc to patient's son. Left a message and asked the patient's son to contact me. Seeking clarification on the Botox/ Hospice discussion for Dr. Carles Collet before they proceed with Botox as it appears this is something that is not covered by hospice. It appears that the son is interested in proceeding with botox instead of hospice services at this time. I will document once son contacts me back.

## 2018-01-17 NOTE — Telephone Encounter (Signed)
Received a call from Tiffany [name] ( Richard's wife) she left a message asking for me to call back. I tried to call back her number. Will also need to talk with Richard. I will try his cell.

## 2018-01-21 ENCOUNTER — Encounter: Payer: Self-pay | Admitting: Family Medicine

## 2018-01-22 ENCOUNTER — Encounter: Payer: Self-pay | Admitting: Family Medicine

## 2018-01-22 ENCOUNTER — Ambulatory Visit (INDEPENDENT_AMBULATORY_CARE_PROVIDER_SITE_OTHER): Payer: Medicare HMO | Admitting: Family Medicine

## 2018-01-22 VITALS — BP 130/71 | HR 70 | Resp 15

## 2018-01-22 DIAGNOSIS — I10 Essential (primary) hypertension: Secondary | ICD-10-CM | POA: Diagnosis not present

## 2018-01-22 DIAGNOSIS — G231 Progressive supranuclear ophthalmoplegia [Steele-Richardson-Olszewski]: Secondary | ICD-10-CM

## 2018-01-22 DIAGNOSIS — F419 Anxiety disorder, unspecified: Secondary | ICD-10-CM | POA: Diagnosis not present

## 2018-01-22 DIAGNOSIS — R1312 Dysphagia, oropharyngeal phase: Secondary | ICD-10-CM

## 2018-01-22 MED ORDER — AMLODIPINE BESYLATE 10 MG PO TABS: ORAL_TABLET | ORAL | 0 refills | Status: DC

## 2018-01-22 NOTE — Progress Notes (Signed)
OFFICE VISIT  01/22/2018   CC:  Chief Complaint  Patient presents with  . Follow-up    htn - oral secretions    HPI:    Patient is a 84 y.o. Caucasian female who presents accompanied by caregiver for 2 week f/u HTN and ongoing cough and regurgitation/choking on secretions secondary to her progressive supranuclear palsy. Last visit I recommended she increase her amlodipine to 10mg  qd for better bp control.  After this, however, at the suggestion of pt's neurologist (Dr. Carles Collet), we decided to encourage a less aggressive approach to bp control with hero--permissive HTN--due to the nature of pt's with PSP.  She is feeling much better lately. Less coughing/choking.  She is relaxing more, sleep is better, she is eating good.    Also, I started pantoprazole qAM to try to diminish and LPR that may be contributing to pharyngeal secretions. Also, I recently started clonazepam 0.5mg  1/2-1 tab bid prn anxiety. She is taking 1/2 clonaz once a day.  After some initial oversedation, she has begun to tolerate this med well.  Home bp's high 130s last few days: she is taking 1/2 of 10mg  amlodipine as instructed.  Pt/caregivers awaiting insurance approval for botox inj in salivary glands. Also awaiting approval for the Vest. She has albuterol but no nebulizer yet.  She can ambulate with a walker after someone helps her stand up, but is a bit unsteady. She is able to feed herself but is beginning to require more assistance with this task. She can use the toilet to urinate w/out assistance, but if she has a BM she requires assistance.  Past Medical History:  Diagnosis Date  . ALLERGIC RHINITIS 11/21/2006  . At high risk for aspiration 12/2017   Progressive oral pharyngeal dysfunction/dysphagia: pulm recommended daily vest with cough assist device + albuterol nebulized  . BACK PAIN 12/17/2006  . COLONIC POLYPS, HX OF 11/21/2006  . HYPERTENSION 11/21/2006  . Moderate aortic stenosis   . OSTEOPENIA  08/11/2009  . OSTEOPOROSIS 11/21/2006  . PEDAL EDEMA 07/05/2008  . Progressive supranuclear palsy (HCC)    Dr. Carles Collet is her neurologist.  Dr. Carles Collet plans to do botox injections in her salivary glands (as of late Oct 2019).    Jiles Garter MURMUR 9/79/8921    Past Surgical History:  Procedure Laterality Date  . BREAST EXCISIONAL BIOPSY Right    benign  . BREAST SURGERY     bx  . CATARACT EXTRACTION Bilateral 2012  . HEMORRHOID SURGERY    . TONSILLECTOMY    . TRANSTHORACIC ECHOCARDIOGRAM  04/15/2017   EF 55-60%, mild AS (no change from 2017), grd I DD.  Repeat 2 yrs planned.    Outpatient Medications Prior to Visit  Medication Sig Dispense Refill  . aspirin 81 MG tablet Take 81 mg by mouth daily.      Marland Kitchen atorvastatin (LIPITOR) 10 MG tablet Take 1 tablet (10 mg total) by mouth daily. 14 tablet 0  . benazepril (LOTENSIN) 40 MG tablet TAKE 1 TABLET EVERY DAY 90 tablet 3  . Calcium Carbonate (CALTRATE 600) 1500 MG TABS Take by mouth 2 (two) times daily.      . carbidopa-levodopa (SINEMET IR) 25-100 MG tablet TAKE 2 TABLETS IN THE MORNING, 1 TABLET  IN  THE  AFTERNOON AND 1 TABLET IN THE EVENING 360 tablet 1  . Cholecalciferol (VITAMIN D) 1000 UNITS capsule Take 1,000 Units by mouth daily.      . clonazePAM (KLONOPIN) 0.5 MG tablet 1/2-1 tab po bid  prn anxiety 60 tablet 0  . Multiple Vitamin (MULTIVITAMIN) tablet Take 1 tablet by mouth daily.      . Omega-3 Fatty Acids (FISH OIL) 1200 MG CAPS Take 1 capsule by mouth daily.    . pantoprazole (PROTONIX) 40 MG tablet Take 1 tablet (40 mg total) by mouth daily. 30 tablet 3  . amLODipine (NORVASC) 10 MG tablet Take 1 tablet (10 mg total) by mouth daily. 30 tablet 0  . albuterol (PROVENTIL) (2.5 MG/3ML) 0.083% nebulizer solution Take 3 mLs (2.5 mg total) by nebulization every 4 (four) hours as needed for wheezing or shortness of breath. (Patient not taking: Reported on 01/22/2018) 75 mL 5   No facility-administered medications prior to visit.      Allergies  Allergen Reactions  . Codeine Phosphate     ROS As per HPI  PE: Blood pressure 130/71, pulse 70, resp. rate 15, SpO2 96 %. Gen: Alert, well appearing.  Patient is oriented to person, place, time, and situation. AFFECT: pleasant, lucid thought and speech. ZOX:WRUE: no injection, icteris, swelling, or exudate.  EOMI, PERRLA. Mouth: lips without lesion/swelling.  Oral mucosa pink and moist. Oropharynx without erythema, exudate, or swelling.  CV: RRR, 4-5/4 systolic murmur, no rub/gallop. CV: RRR, no m/r/g.   LUNGS: CTA bilat, nonlabored resps, good aeration in all lung fields. EXT: no clubbing or cyanosis.  no edema.    LABS:    Chemistry      Component Value Date/Time   NA 140 01/01/2018 1346   K 4.4 01/01/2018 1346   CL 98 01/01/2018 1346   CO2 38 (H) 01/01/2018 1346   BUN 22 01/01/2018 1346   CREATININE 0.97 01/01/2018 1346   CREATININE 1.10 (H) 06/13/2015 1404      Component Value Date/Time   CALCIUM 10.2 01/01/2018 1346   ALKPHOS 71 01/01/2018 1346   AST 16 01/01/2018 1346   ALT 10 01/01/2018 1346   BILITOT 0.5 01/01/2018 1346      IMPRESSION AND PLAN:  1) Cough, with excessive oral pharyngeal secretions/dysphagia, high aspiration risk: she is improved quite a bit since I saw her last. She is awaiting on insurance approval of the Vest, the nebulizer machine, and salivary gland botox injections.  2) Anxiety: I think this was playing a big role.  It seems that 1/2 tab of 0.5mg  clonaz helps her quite a bit WITHOUT significant side effect.  3) HTN: pretty well controlled on current regimen.  Permissive HTN is the plan---avoid bringing bp down too low. Pt high risk for autonomic dysfunction.  4) Progressive supranuclear palsy: this recent prolonged problem with cough/secretion clearing has led to consideration of Hospice referral.  In fact, Dr. Carles Collet has ordered the referral, but it is on hold until she gets her botox injections.  An After Visit  Summary was printed and given to the patient.  FOLLOW UP: Return in about 2 months (around 03/24/2018) for routine chronic illness f/u.  Signed:  Crissie Sickles, MD           01/22/2018

## 2018-01-29 ENCOUNTER — Telehealth: Payer: Self-pay | Admitting: Pulmonary Disease

## 2018-01-29 NOTE — Telephone Encounter (Signed)
I see where order was placed for nebulizer. Called [place] at [facility]. Unable to reach left message to give [place] a call back.   Called patient advised that we are awaiting a call back from [facility]. Will call once we get a response.

## 2018-01-30 NOTE — Telephone Encounter (Signed)
I sent a community message to [place]. [name] is not available. Will await response

## 2018-02-03 NOTE — Telephone Encounter (Signed)
[name] [facility] [phone] ext [phone]) pt daughter in law declined the nebulizer states believe the patient does not need the nebulizer due to the patient being  Claustrophobic unless the doctor really feels like the patient needs the nebulizer

## 2018-02-03 NOTE — Telephone Encounter (Signed)
Spoke with pt's daughter, she thought we left a message in regards to CPAP. I advised her that the nebulizer required a mouthpiece and wouldn't go over her face. I advised her to call [name] back so she can receive the nebulizer machine She understood and nothing further is needed.

## 2018-02-03 NOTE — Telephone Encounter (Signed)
Called [facility] and left a message with [name] will call back.

## 2018-02-04 ENCOUNTER — Other Ambulatory Visit: Payer: Self-pay | Admitting: Family Medicine

## 2018-02-05 NOTE — Telephone Encounter (Signed)
1/2 tab per day

## 2018-02-05 NOTE — Telephone Encounter (Signed)
Does pt need to take a whole tab or half tab? Please advise. Thanks.

## 2018-02-07 ENCOUNTER — Telehealth: Payer: Self-pay | Admitting: Neurology

## 2018-02-07 ENCOUNTER — Ambulatory Visit (INDEPENDENT_AMBULATORY_CARE_PROVIDER_SITE_OTHER): Payer: Medicare HMO | Admitting: Neurology

## 2018-02-07 DIAGNOSIS — K117 Disturbances of salivary secretion: Secondary | ICD-10-CM

## 2018-02-07 MED ORDER — RIMABOTULINUMTOXINB 5000 UNIT/ML IM SOLN
5000.0000 [IU] | Freq: Once | INTRAMUSCULAR | Status: AC
  Administered 2018-02-07: 5000 [IU] via INTRAMUSCULAR

## 2018-02-07 NOTE — Telephone Encounter (Signed)
Spoke with [place], this sounds like approval for some sort of equipment. If they need anything from [place] they will call back.

## 2018-02-07 NOTE — Telephone Encounter (Signed)
[facility] called regarding Authorization Approval [id number] . Thanks

## 2018-02-07 NOTE — Telephone Encounter (Signed)
Did they say what this approval was for?

## 2018-02-07 NOTE — Procedures (Signed)
[facility]    History:  Diagnosis: Sialorrhea    Site confirmed:yes Consent obtained: yest     Injections  Location Left  Right Units Number of sites             Parotid 2500 2500 5000 1 per side  TOTAL UNITS:     5000      Type of Toxin: Myobloc type B As ordered and injected IM at today's visit Total Units: 5000  Discarded Units: 0  Needle drawback with each injection was free of blood. Pt tolerated procedure well without complications.   Reinjection is anticipated in 3 months.                     

## 2018-02-10 NOTE — Progress Notes (Signed)
Resp Tech in office this AM for breakfast. Note printed and given to her. Nothing further needed at this time.

## 2018-02-11 ENCOUNTER — Other Ambulatory Visit: Payer: Self-pay | Admitting: Neurology

## 2018-02-24 NOTE — Progress Notes (Signed)
Tiffany [name] was seen today in the movement disorders clinic for neurologic consultation at the request of McGowen, Adrian Blackwater, MD.  The patient is seen today in neurologic consultation for the subacute evaluation of gait and speech changes for the last several months.  Looking back, she thinks that her handwriting began to change after Thanksgiving some time.  She remembers it being difficult to write her cards at thanksgiving, as the handwriting was smaller and less fluid.  She thinks that she began to have difficulty with word finding about 2 months ago.  She presented to the emergency room at [facility] in [place] on February 27 and I reviewed those records.  She had an MRI of the brain there and I do not have the films, but I do have the report.  It was nonacute and only revealed mild white matter changes.  She was told to follow up with ENT.  She did not have an ENT consult but she did follow up with her primary care physician on 06/03/2015 and was set up with a speech therapy evaluation, cardiology follow-up and was referred here.  She had a carotid ultrasound on 06/06/2015 which just demonstrated 1-39% stenosis.  I reviewed her cardiology follow-up from 06/09/2015 and they did not think that her new symptoms were related to her aortic stenosis.  Her repeat echocardiogram is pending.  I did get a note from her speech therapist and reviewed their records as well.  It was felt that she had both dysphasia as well as dysphagia and a modified barium swallow as well as traditional speech, occupational and physical therapy are being recommended.  Denies muscles jumping.  Is having trouble swallowing.  Only new medication is Lipitor and she started that a few weeks ago.    07/15/15 update:  The patient follows up today after having multiple tests performed.  She had an MRI of the cervical spine on April, 2017 that demonstrated degenerative changes and central canal stenosis and marked bilateral neural  foraminal stenosis at the C4-C5 level and C5-C6 level and significant left neural foraminal stenosis at the C3-C4 level.  She had an EMG performed that was normal, without evidence of motor neuron disease.  She had a modified barium swallow done on 06/29/2015.  There was moderate pharyngeal phase dysphagia.  Regular diet with thin liquids was recommended.  A chin tuck maneuver was helpful.  Home meds with pure was also recommended.  She had a dat scan done on 07/05/2015 with abnormal significant bilateral reduction and putamen uptake with activity largely confined to the caudate nuclei bilaterally.  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  She has been in physical therapy, speech therapy and occupational therapy consistently sent April 25.  Last visit, I started her on levodopa, 100 mg 3 times per day.  She thinks that she is doing better.    She has had 2 falls since her last visit. With one, she went to the bathroom in the middle of the night and fell backward getting in the bed.  She hurt her L shoulder especially when she lifts it.  Physical therapist did recommend a Rollator and I gave her a prescription for that.  I noted that her primary care physician has given her a handicap placard.  She was supposed to have neuropsych testing done but she didn't call to schedule it when messages were left.  She is not driving and relying on friends for transportation.  Feels that swallowing is better.  Having some monocular diplopia that is vertical.    12/22/15 update:  The patient follows up today.  She is accompanied by her daughter in law who supplements the history.  She is on carbidopa/levodopa 25/100, one tablet 3 times per day (5:30am/11am/5pm).  She does not think that the medication is helping.  No SE with the medication.  She had 1 fall at her daughter in laws house getting out of the bed.  Didn't get hurt.  Family helped her up.  No lightheadedness or near syncope.  No  hallucinations.  She does not drive.  She does continue to have some diplopia.  She had neuropsych testing with Dr. Si Raider on 10/26/2015.  There was evidence of mild cognitive impairment and depression.  Pt denies any depression.  There was no evidence of dementia.  Lives alone but staying with her family m-w and then son takes her to church on wed night and daughter in law comes thurs.  She is alone Friday night.  Son comes to her house some Saturday and sundays, her other son takes her to church.  Family concerned about her being alone but pt absolutely refuses to leave home or let someone come in.  No swallowing issues or problems with choking.  Daughter in law states that she has had some choking.  One choking episode in a restaurant on liquid this past week.    03/29/16 update:  Pt is on carbidopa/levodopa 25/100 which was increased last visit to 2 tablets in the morning, one in the afternoon and one in the evening.  [facility] and was following with the patient and I reviewed their notes.  She did go for her therapy screen, but ultimately requested that she have home health instead.  A referral was placed for this just 2 days ago.  She has some coughing episodes and I got a note from [facility] yesterday asking for an order for a repeat MBE.  Coughing is mostly with liquids.  No falls.  Uses walker some of time but admits not all of the time.  Lots of blurry vision but no diplopia but noting change.  She is not driving.  She is preparing her own pill box without trouble.  Notes trouble with putting on pants.  Admits to depression  07/25/16 update: Patient seen today in follow-up, accompanied by her son who supplements the history.  She is on carbidopa/levodopa 25/100, 2 tablets in the morning, one in the afternoon and when the evening.  Last visit, Lexapro, 10 mg was added.  We called her about 6 weeks after we started it and she said it didn't help so we increased it to 20 mg.  States that she d/c lexapro because "it wasn't  doing me any good."  States that she feels good off of it.   she had a modified barium swallow on 04/10/2016.  This demonstrated mild oral phrase and moderate pharyngeal phase dysphagia.  Dysphagia 3 diet with nectar thick liquids was recommended.  Meals on wheels is coming to the house.  Caregiver comes by for an hour 5 days per week to make sure patient is okay.     11/29/16 update:  Patient seen today in follow-up for her progressive supranuclear palsy.  She is accompanied by her son who supplements the history.  She remains on carbidopa/levodopa 25/100, 2 tablets in the morning, one in the afternoon and one in the evening.    She denies choking.  She  did have a swallowing study earlier this year which recommended dysphagia 3 diet with nectar thick liquids.  She denies following this.  She does have Meals on Wheels.  She lives alone.  She has a caregiver 5 days per week, but only for an hour per day.  She thinks that she is doing well with this level of caregiving.  Her son thinks that perhaps it is time for more caregiving.  Reports several falls since last visit.  With one fall she had the walker and fell backward.  With the others, she didn't have the walker  05/24/17 update: Patient is seen today back in follow-up for her PSP.  She is accompanied by her son, Tiffany [name], who supplements the history (last visit it was her son Liliane Channel).  She is on carbidopa/levodopa 25/100, 2 tablets in the morning, 1 in the afternoon and 1 in the evening.  She has had no falls.  She has had no choking spells, but notes that she coughs a lot with eating, as does her son.  I had trouble ascertaining if she was still using the thickener (she uses "yes" to mean "no" at times and vice versa).  Last visit, I spent a significant amount time with the patient and her son discussing her living situation and they were going to try to come up with other options for long-term feasibility.  Patient does report that she has a caregiver at night, 7  days/week.  She is alone during the day.  She has had multiple falls, including one fall in the shower.  With the fall in the shower, her caregiver was present.  09/24/17 update: Patient is seen today in follow-up for PSP.  She is accompanied by her son who supplements the history.  The patient is on carbidopa/levodopa 25/100, 2 tablets in the morning, 1 in the afternoon and 1 in the evening.  Modified barium swallow was completed on June 05, 2017.  Mild oropharyngeal dysphagia was noted.  Dysphagia 3 diet with thin liquids was recommended.    Records have been reviewed since our last visit.  She was seen by primary care in the middle of May.  She had a fall at the end of April (was Easter Sunday) and landed on her right shoulder.  She initially didn't tell the family until she realized she got hurt.   She was given a sling and told to apply ice or heat.  After that the family hired 24/7 caregiving.  There are 5 ladies coming into the home and visiting Angels come into the home.  They have the application for [facility] if they need that.    01/13/18 update: Patient is seen today in follow-up for PSP, accompanied by her caregiver who supplements history.  She was worked in today because of the fact that she was having more difficulties.  I personally discussed the case with her primary care physician.  She is still on carbidopa/levodopa 25/100, 2 tablets in the morning, 1 in the afternoon and 1 in the evening.  She is having more coughing.  More difficulty clearing secretions.  She was in the emergency room on October 1 with cough.  Those notes are reviewed.  Emergency room felt that this was a viral URI.  She was treated as an outpatient with prednisone taper and Levaquin.  Coughing and trouble clearing secretions continues.  She was just started on clonazepam for anxiety.  Even 1/2 tablet giving her hang over effect (only taking q hs).  More dizzy than in the past.  Had to increase amlodipine because of  elevated systolic blood pressures.    02/25/18 update: Patient seen today in follow-up for PSP, accompanied by her caregiver and her son who supplement the history.  Hospice did see the patient since last visit.  The patient decided that she did not want to enter hospice, primarily because she wanted to try Botox first for her sialorrhea.  We did inject her Botox on February 07, 2018.  Caregiver reports that it kicked in within 4-5 days.  It has worked very well.  She also saw pulmonary on January 16, 2018 and appreciate Dr. Fabio Bering input.  He recommended daily vest therapy as well as CoughAssist device to help with airway clearance.  The vest is supposed to come this week.   He also prescribed an albuterol nebulizer.  They are supposed to get that this week as well.  Dizziness has been about the same but BPs are better.  Caregiver asks about difficulty getting the patient out of the bed in the morning.  It takes some time and then she has significant difficulty moving compared to the rest of the day.  She ambulates with them with assistance.  Someone always walks behind her because she tends to retropulse.  ALLERGIES:   Allergies  Allergen Reactions  . Codeine Phosphate     CURRENT MEDICATIONS:  Outpatient Encounter Medications as of 02/25/2018  Medication Sig  . albuterol (PROVENTIL) (2.5 MG/3ML) 0.083% nebulizer solution Take 3 mLs (2.5 mg total) by nebulization every 4 (four) hours as needed for wheezing or shortness of breath.  Marland Kitchen amLODipine (NORVASC) 10 MG tablet 1/2 tab po qd  . amLODipine (NORVASC) 10 MG tablet Take 0.5 tablets (5 mg total) by mouth daily.  Marland Kitchen aspirin 81 MG tablet Take 81 mg by mouth daily.    Marland Kitchen atorvastatin (LIPITOR) 10 MG tablet Take 1 tablet (10 mg total) by mouth daily.  . benazepril (LOTENSIN) 40 MG tablet TAKE 1 TABLET EVERY DAY  . Calcium Carbonate (CALTRATE 600) 1500 MG TABS Take by mouth 2 (two) times daily.    . carbidopa-levodopa (SINEMET IR) 25-100 MG tablet TAKE 2  TABLETS IN THE MORNING, 1 TABLET  IN  THE  AFTERNOON AND 1 TABLET IN THE EVENING  . Cholecalciferol (VITAMIN D) 1000 UNITS capsule Take 1,000 Units by mouth daily.    . clonazePAM (KLONOPIN) 0.5 MG tablet 1/2-1 tab po bid prn anxiety  . Multiple Vitamin (MULTIVITAMIN) tablet Take 1 tablet by mouth daily.    . Omega-3 Fatty Acids (FISH OIL) 1200 MG CAPS Take 1 capsule by mouth daily.  . pantoprazole (PROTONIX) 40 MG tablet Take 1 tablet (40 mg total) by mouth daily.   No facility-administered encounter medications on file as of 02/25/2018.     PAST MEDICAL HISTORY:   Past Medical History:  Diagnosis Date  . ALLERGIC RHINITIS 11/21/2006  . At high risk for aspiration 12/2017   Progressive oral pharyngeal dysfunction/dysphagia: pulm recommended daily vest with cough assist device + albuterol nebulized  . BACK PAIN 12/17/2006  . COLONIC POLYPS, HX OF 11/21/2006  . HYPERTENSION 11/21/2006  . Moderate aortic stenosis   . OSTEOPENIA 08/11/2009  . OSTEOPOROSIS 11/21/2006  . PEDAL EDEMA 07/05/2008  . Progressive supranuclear palsy (HCC)    Dr. Carles Collet is her neurologist.  Dr. Carles Collet plans to do botox injections in her salivary glands (as of late Oct 2019).    Jiles Garter MURMUR [birth year]  PAST SURGICAL HISTORY:   Past Surgical History:  Procedure Laterality Date  . BREAST EXCISIONAL BIOPSY Right    benign  . BREAST SURGERY     bx  . CATARACT EXTRACTION Bilateral 2012  . HEMORRHOID SURGERY    . TONSILLECTOMY    . TRANSTHORACIC ECHOCARDIOGRAM  04/15/2017   EF 55-60%, mild AS (no change from 2017), grd I DD.  Repeat 2 yrs planned.    SOCIAL HISTORY:   Social History   Socioeconomic History  . Marital status: Single    Spouse name: Not on file  . Number of children: Not on file  . Years of education: Not on file  . Highest education level: Not on file  Occupational History  . Occupation: retired    Comment: Geographical information systems officer  . Financial resource strain: Not on file  . Food insecurity:     Worry: Not on file    Inability: Not on file  . Transportation needs:    Medical: Not on file    Non-medical: Not on file  Tobacco Use  . Smoking status: Former Smoker    Last attempt to quit: 03/27/1995    Years since quitting: 22.9  . Smokeless tobacco: Never Used  Substance and Sexual Activity  . Alcohol use: No    Alcohol/week: 0.0 standard drinks  . Drug use: No  . Sexual activity: Not on file  Lifestyle  . Physical activity:    Days per week: Not on file    Minutes per session: Not on file  . Stress: Not on file  Relationships  . Social connections:    Talks on phone: Not on file    Gets together: Not on file    Attends religious service: Not on file    Active member of club or organization: Not on file    Attends meetings of clubs or organizations: Not on file    Relationship status: Not on file  . Intimate partner violence:    Fear of current or ex partner: Not on file    Emotionally abused: Not on file    Physically abused: Not on file    Forced sexual activity: Not on file  Other Topics Concern  . Not on file  Social History Narrative   Widow, 2 sons.   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   Relocated from [place], [employer].   Tob: 10 pack-yr hx, quit age 71.   Alcohol: none.   Lives with private caregivers: 24/7 care.    FAMILY HISTORY:   Family Status  Relation Name Status  . Mother  Deceased at age 61       ovarian ca  . Father  Deceased at age 23       cad  . Sister  Alive       3, one with breast CA  . Sister  Deceased       27, pancreatic CA, brain tumor  . Brother  Alive       1, CAD  . Brother  Deceased       47  . Child  Alive       2 sons, alive and well    ROS:  Review of Systems  Unable to perform ROS: Language    PHYSICAL EXAMINATION:    VITALS:   Vitals:   02/25/18 1428  BP: 126/80  Pulse: 80  SpO2: 93%    GEN:  The patient appears stated age and is  in NAD. HEENT:  Normocephalic, atraumatic.  The mucous  membranes are moist. The superficial temporal arteries are without ropiness or tenderness. CV:  RRR Lungs:  CTAB Neck/HEME:  There are no carotid bruits bilaterally.  Neurological examination:  Orientation: The patient is alert and oriented x3. Cranial nerves: There is good facial symmetry.  There is significant facial hypomimia.  The patient speaks very little, but when she does it is bulbar, nonfluent and dysarthric. She has trouble with the gutteral sounds.   Hearing is intact to conversational tone. Sensation: Sensation is intact to light touch throughout Motor: Strength is at least antigravity x4  Movement examination: Tone: There is normal tone in the UE/LE Abnormal movements: none Coordination:  There is decremation with RAM's, with any form of RAMS, including alternating supination and pronation of the forearm, hand opening and closing, finger taps, heel taps and toe taps. Gait and Station: The patient is in a WC and we did not attempt ambulation.      Labs:    Chemistry      Component Value Date/Time   NA 140 01/01/2018 1346   K 4.4 01/01/2018 1346   CL 98 01/01/2018 1346   CO2 38 (H) 01/01/2018 1346   BUN 22 01/01/2018 1346   CREATININE 0.97 01/01/2018 1346   CREATININE 1.10 (H) 06/13/2015 1404      Component Value Date/Time   CALCIUM 10.2 01/01/2018 1346   ALKPHOS 71 01/01/2018 1346   AST 16 01/01/2018 1346   ALT 10 01/01/2018 1346   BILITOT 0.5 01/01/2018 1346     Lab Results  Component Value Date   TSH 1.95 03/28/2017      ASSESSMENT/PLAN:  1.  PSP  -She had a dat scan done on 07/05/2015 with abnormal significant bilateral reduction and putamen uptake with activity largely confined to the caudate nuclei bilaterally.  -Continue carbidopa/levodopa 25/100 2/1/1.  Told caregiver to give carbidopa/levodopa 25/100 prior to getting OOB and then do exercises and then try to get OOB 30 min later to see if helps.  They think that she is having trouble in the AM  moving compared to other times of the day.  I did tell them that I really do not like her walking anyway, but they are walking right behind her to make sure that she does not fall.  -Patient asked me if she could expect blindness with the disease.  I explained to her that this does not cause blindness, but rather diplopia and visual restriction because of trouble moving the eyes. 2.  Depression  -Currently off of her Lexapro and thinks that she is doing well.  She will let me know if she feels differently. 3.  Mild cognitive impairment  -She had neuropsych testing with Dr. Si Raider on 10/26/2015.  There was evidence of mild cognitive impairment and depression.  There was no evidence of dementia. 4.  Dysphagia  -Modified barium swallow was completed on June 05, 2017.  Mild oropharyngeal dysphagia was noted.  Dysphagia 3 diet with thin liquids was recommended.    -saw Dr. Valeta Harms.  Appreciate input.  Recommended vest and cough assist device.  Are to get vest later this week 5.  Sialorrhea  -s/p botox injections last month.  Is helping.  They want to hold hospice because of this.  Discussed alternatives, including atropine drops and scopolamine patches.  For now, they want to continue the injections. 6.  Dizziness  -Amlodipine has been lowered.  Still has some dizziness. 7.  I  will see the patient at her scheduled Botox injections in February.  They declined seeing the social worker today.  Explained that our current social worker will only be here for the next month.  They can make an appointment with her if needed within the next month.  We will be looking for a new social worker to replace her.  Much greater than 50% of this visit was spent in counseling and coordinating care.  Total face to face time:  25 min

## 2018-02-25 ENCOUNTER — Encounter: Payer: Self-pay | Admitting: Neurology

## 2018-02-25 ENCOUNTER — Ambulatory Visit (INDEPENDENT_AMBULATORY_CARE_PROVIDER_SITE_OTHER): Payer: Medicare HMO | Admitting: Neurology

## 2018-02-25 VITALS — BP 126/80 | HR 80

## 2018-02-25 DIAGNOSIS — R1319 Other dysphagia: Secondary | ICD-10-CM

## 2018-02-25 DIAGNOSIS — G231 Progressive supranuclear ophthalmoplegia [Steele-Richardson-Olszewski]: Secondary | ICD-10-CM | POA: Diagnosis not present

## 2018-02-25 DIAGNOSIS — K117 Disturbances of salivary secretion: Secondary | ICD-10-CM

## 2018-02-26 DIAGNOSIS — G231 Progressive supranuclear ophthalmoplegia [Steele-Richardson-Olszewski]: Secondary | ICD-10-CM | POA: Diagnosis not present

## 2018-02-26 DIAGNOSIS — J398 Other specified diseases of upper respiratory tract: Secondary | ICD-10-CM | POA: Diagnosis not present

## 2018-02-26 DIAGNOSIS — R1319 Other dysphagia: Secondary | ICD-10-CM | POA: Diagnosis not present

## 2018-02-26 DIAGNOSIS — R2689 Other abnormalities of gait and mobility: Secondary | ICD-10-CM | POA: Diagnosis not present

## 2018-02-26 DIAGNOSIS — J698 Pneumonitis due to inhalation of other solids and liquids: Secondary | ICD-10-CM | POA: Diagnosis not present

## 2018-02-26 DIAGNOSIS — R269 Unspecified abnormalities of gait and mobility: Secondary | ICD-10-CM | POA: Diagnosis not present

## 2018-02-27 DIAGNOSIS — J986 Disorders of diaphragm: Secondary | ICD-10-CM | POA: Diagnosis not present

## 2018-03-18 ENCOUNTER — Encounter: Payer: Self-pay | Admitting: Family Medicine

## 2018-03-29 DIAGNOSIS — R1319 Other dysphagia: Secondary | ICD-10-CM | POA: Diagnosis not present

## 2018-03-29 DIAGNOSIS — G231 Progressive supranuclear ophthalmoplegia [Steele-Richardson-Olszewski]: Secondary | ICD-10-CM | POA: Diagnosis not present

## 2018-03-29 DIAGNOSIS — J398 Other specified diseases of upper respiratory tract: Secondary | ICD-10-CM | POA: Diagnosis not present

## 2018-03-29 DIAGNOSIS — R269 Unspecified abnormalities of gait and mobility: Secondary | ICD-10-CM | POA: Diagnosis not present

## 2018-03-29 DIAGNOSIS — J698 Pneumonitis due to inhalation of other solids and liquids: Secondary | ICD-10-CM | POA: Diagnosis not present

## 2018-03-29 DIAGNOSIS — R2689 Other abnormalities of gait and mobility: Secondary | ICD-10-CM | POA: Diagnosis not present

## 2018-03-30 DIAGNOSIS — J986 Disorders of diaphragm: Secondary | ICD-10-CM | POA: Diagnosis not present

## 2018-04-14 ENCOUNTER — Other Ambulatory Visit: Payer: Self-pay | Admitting: Family Medicine

## 2018-04-14 NOTE — Telephone Encounter (Signed)
RF request for Clonazepam 0.5mg   LOV: 01/22/18 Next ov: Not scheduled   Last written: 01/09/18  Please Advise

## 2018-04-29 DIAGNOSIS — G231 Progressive supranuclear ophthalmoplegia [Steele-Richardson-Olszewski]: Secondary | ICD-10-CM | POA: Diagnosis not present

## 2018-04-29 DIAGNOSIS — R2689 Other abnormalities of gait and mobility: Secondary | ICD-10-CM | POA: Diagnosis not present

## 2018-04-29 DIAGNOSIS — J698 Pneumonitis due to inhalation of other solids and liquids: Secondary | ICD-10-CM | POA: Diagnosis not present

## 2018-04-29 DIAGNOSIS — J398 Other specified diseases of upper respiratory tract: Secondary | ICD-10-CM | POA: Diagnosis not present

## 2018-04-29 DIAGNOSIS — R269 Unspecified abnormalities of gait and mobility: Secondary | ICD-10-CM | POA: Diagnosis not present

## 2018-04-29 DIAGNOSIS — R1319 Other dysphagia: Secondary | ICD-10-CM | POA: Diagnosis not present

## 2018-04-30 DIAGNOSIS — R4702 Dysphasia: Secondary | ICD-10-CM | POA: Diagnosis not present

## 2018-04-30 DIAGNOSIS — G231 Progressive supranuclear ophthalmoplegia [Steele-Richardson-Olszewski]: Secondary | ICD-10-CM | POA: Diagnosis not present

## 2018-04-30 DIAGNOSIS — J986 Disorders of diaphragm: Secondary | ICD-10-CM | POA: Diagnosis not present

## 2018-05-05 ENCOUNTER — Other Ambulatory Visit: Payer: Self-pay | Admitting: Family Medicine

## 2018-05-22 ENCOUNTER — Emergency Department (HOSPITAL_BASED_OUTPATIENT_CLINIC_OR_DEPARTMENT_OTHER): Payer: Medicare HMO

## 2018-05-22 ENCOUNTER — Emergency Department (HOSPITAL_BASED_OUTPATIENT_CLINIC_OR_DEPARTMENT_OTHER)
Admission: EM | Admit: 2018-05-22 | Discharge: 2018-05-22 | Disposition: A | Discharge status: Other Acute Inpatient Hospital | Payer: Medicare HMO | Attending: Emergency Medicine | Admitting: Emergency Medicine

## 2018-05-22 ENCOUNTER — Other Ambulatory Visit: Payer: Self-pay

## 2018-05-22 ENCOUNTER — Telehealth: Payer: Self-pay

## 2018-05-22 ENCOUNTER — Encounter (HOSPITAL_BASED_OUTPATIENT_CLINIC_OR_DEPARTMENT_OTHER): Payer: Self-pay | Admitting: Emergency Medicine

## 2018-05-22 DIAGNOSIS — I350 Nonrheumatic aortic (valve) stenosis: Secondary | ICD-10-CM | POA: Diagnosis not present

## 2018-05-22 DIAGNOSIS — K5730 Diverticulosis of large intestine without perforation or abscess without bleeding: Secondary | ICD-10-CM | POA: Diagnosis not present

## 2018-05-22 DIAGNOSIS — Z79899 Other long term (current) drug therapy: Secondary | ICD-10-CM | POA: Diagnosis not present

## 2018-05-22 DIAGNOSIS — R262 Difficulty in walking, not elsewhere classified: Secondary | ICD-10-CM

## 2018-05-22 DIAGNOSIS — L03314 Cellulitis of groin: Secondary | ICD-10-CM | POA: Insufficient documentation

## 2018-05-22 DIAGNOSIS — R112 Nausea with vomiting, unspecified: Secondary | ICD-10-CM | POA: Diagnosis not present

## 2018-05-22 DIAGNOSIS — T17320A Food in larynx causing asphyxiation, initial encounter: Secondary | ICD-10-CM | POA: Diagnosis not present

## 2018-05-22 DIAGNOSIS — R109 Unspecified abdominal pain: Secondary | ICD-10-CM | POA: Diagnosis not present

## 2018-05-22 DIAGNOSIS — Z7982 Long term (current) use of aspirin: Secondary | ICD-10-CM | POA: Diagnosis not present

## 2018-05-22 DIAGNOSIS — M60851 Other myositis, right thigh: Secondary | ICD-10-CM | POA: Diagnosis not present

## 2018-05-22 DIAGNOSIS — M6289 Other specified disorders of muscle: Secondary | ICD-10-CM | POA: Diagnosis not present

## 2018-05-22 DIAGNOSIS — I10 Essential (primary) hypertension: Secondary | ICD-10-CM | POA: Insufficient documentation

## 2018-05-22 DIAGNOSIS — R1030 Lower abdominal pain, unspecified: Secondary | ICD-10-CM | POA: Diagnosis not present

## 2018-05-22 DIAGNOSIS — J398 Other specified diseases of upper respiratory tract: Secondary | ICD-10-CM | POA: Diagnosis not present

## 2018-05-22 DIAGNOSIS — R4702 Dysphasia: Secondary | ICD-10-CM | POA: Diagnosis not present

## 2018-05-22 DIAGNOSIS — K5900 Constipation, unspecified: Secondary | ICD-10-CM | POA: Diagnosis not present

## 2018-05-22 DIAGNOSIS — R269 Unspecified abnormalities of gait and mobility: Secondary | ICD-10-CM | POA: Diagnosis not present

## 2018-05-22 DIAGNOSIS — J698 Pneumonitis due to inhalation of other solids and liquids: Secondary | ICD-10-CM | POA: Diagnosis not present

## 2018-05-22 DIAGNOSIS — R1312 Dysphagia, oropharyngeal phase: Secondary | ICD-10-CM | POA: Diagnosis not present

## 2018-05-22 DIAGNOSIS — G20 Parkinson's disease: Secondary | ICD-10-CM | POA: Diagnosis not present

## 2018-05-22 DIAGNOSIS — R1031 Right lower quadrant pain: Secondary | ICD-10-CM | POA: Diagnosis present

## 2018-05-22 DIAGNOSIS — G231 Progressive supranuclear ophthalmoplegia [Steele-Richardson-Olszewski]: Secondary | ICD-10-CM | POA: Diagnosis not present

## 2018-05-22 DIAGNOSIS — K219 Gastro-esophageal reflux disease without esophagitis: Secondary | ICD-10-CM | POA: Diagnosis not present

## 2018-05-22 DIAGNOSIS — Z885 Allergy status to narcotic agent status: Secondary | ICD-10-CM | POA: Diagnosis not present

## 2018-05-22 DIAGNOSIS — R918 Other nonspecific abnormal finding of lung field: Secondary | ICD-10-CM | POA: Diagnosis not present

## 2018-05-22 DIAGNOSIS — R05 Cough: Secondary | ICD-10-CM | POA: Diagnosis not present

## 2018-05-22 DIAGNOSIS — J986 Disorders of diaphragm: Secondary | ICD-10-CM | POA: Diagnosis not present

## 2018-05-22 DIAGNOSIS — Z87891 Personal history of nicotine dependence: Secondary | ICD-10-CM | POA: Insufficient documentation

## 2018-05-22 DIAGNOSIS — R2689 Other abnormalities of gait and mobility: Secondary | ICD-10-CM | POA: Diagnosis not present

## 2018-05-22 DIAGNOSIS — Z66 Do not resuscitate: Secondary | ICD-10-CM | POA: Diagnosis not present

## 2018-05-22 DIAGNOSIS — M25551 Pain in right hip: Secondary | ICD-10-CM | POA: Diagnosis not present

## 2018-05-22 DIAGNOSIS — M609 Myositis, unspecified: Secondary | ICD-10-CM | POA: Diagnosis not present

## 2018-05-22 HISTORY — DX: Parkinson's disease: G20

## 2018-05-22 LAB — CBC WITH DIFFERENTIAL/PLATELET
ABS IMMATURE GRANULOCYTES: 0.05 10*3/uL (ref 0.00–0.07)
Basophils Absolute: 0 10*3/uL (ref 0.0–0.1)
Basophils Relative: 0 %
Eosinophils Absolute: 0 10*3/uL (ref 0.0–0.5)
Eosinophils Relative: 0 %
HCT: 38.8 % (ref 36.0–46.0)
HEMOGLOBIN: 12.8 g/dL (ref 12.0–15.0)
IMMATURE GRANULOCYTES: 1 %
Lymphocytes Relative: 9 %
Lymphs Abs: 1 10*3/uL (ref 0.7–4.0)
MCH: 29.6 pg (ref 26.0–34.0)
MCHC: 33 g/dL (ref 30.0–36.0)
MCV: 89.6 fL (ref 80.0–100.0)
MONO ABS: 0.4 10*3/uL (ref 0.1–1.0)
MONOS PCT: 4 %
NEUTROS ABS: 9.4 10*3/uL — AB (ref 1.7–7.7)
NEUTROS PCT: 86 %
PLATELETS: 156 10*3/uL (ref 150–400)
RBC: 4.33 MIL/uL (ref 3.87–5.11)
RDW: 11.9 % (ref 11.5–15.5)
WBC: 10.9 10*3/uL — AB (ref 4.0–10.5)
nRBC: 0 % (ref 0.0–0.2)

## 2018-05-22 LAB — URINALYSIS, ROUTINE W REFLEX MICROSCOPIC
Bilirubin Urine: NEGATIVE
Glucose, UA: NEGATIVE mg/dL
HGB URINE DIPSTICK: NEGATIVE
Ketones, ur: NEGATIVE mg/dL
Nitrite: NEGATIVE
PROTEIN: NEGATIVE mg/dL
SPECIFIC GRAVITY, URINE: 1.01 (ref 1.005–1.030)
pH: 7 (ref 5.0–8.0)

## 2018-05-22 LAB — COMPREHENSIVE METABOLIC PANEL
ALBUMIN: 4.2 g/dL (ref 3.5–5.0)
ALT: 7 U/L (ref 0–44)
AST: 26 U/L (ref 15–41)
Alkaline Phosphatase: 74 U/L (ref 38–126)
Anion gap: 6 (ref 5–15)
BUN: 20 mg/dL (ref 8–23)
CHLORIDE: 104 mmol/L (ref 98–111)
CO2: 29 mmol/L (ref 22–32)
Calcium: 9.6 mg/dL (ref 8.9–10.3)
Creatinine, Ser: 0.94 mg/dL (ref 0.44–1.00)
GFR calc Af Amer: >60 mL/min (ref >60)
GFR calc non Af Amer: 56 mL/min — ABNORMAL LOW (ref >60)
Glucose, Bld: 116 mg/dL — ABNORMAL HIGH (ref 70–99)
POTASSIUM: 3.7 mmol/L (ref 3.5–5.1)
Sodium: 139 mmol/L (ref 135–145)
Total Bilirubin: 0.8 mg/dL (ref 0.3–1.2)
Total Protein: 7.2 g/dL (ref 6.5–8.1)

## 2018-05-22 LAB — SEDIMENTATION RATE: Sed Rate: 24 mm/hr — ABNORMAL HIGH (ref 0–22)

## 2018-05-22 LAB — URINALYSIS, MICROSCOPIC (REFLEX)

## 2018-05-22 LAB — CK: Total CK: 274 U/L — ABNORMAL HIGH (ref 38–234)

## 2018-05-22 MED ORDER — MORPHINE SULFATE (PF) 2 MG/ML IV SOLN
2.0000 mg | Freq: Once | INTRAVENOUS | Status: AC
  Administered 2018-05-22: 2 mg via INTRAVENOUS
  Filled 2018-05-22: qty 1

## 2018-05-22 MED ORDER — MORPHINE SULFATE (PF) 4 MG/ML IV SOLN
4.0000 mg | Freq: Once | INTRAVENOUS | Status: AC
  Administered 2018-05-22: 4 mg via INTRAVENOUS
  Filled 2018-05-22: qty 1

## 2018-05-22 MED ORDER — ONDANSETRON HCL 4 MG/2ML IJ SOLN
4.0000 mg | Freq: Once | INTRAMUSCULAR | Status: AC
  Administered 2018-05-22: 4 mg via INTRAVENOUS
  Filled 2018-05-22: qty 2

## 2018-05-22 MED ORDER — SODIUM CHLORIDE 0.9 % IV BOLUS
1000.0000 mL | Freq: Once | INTRAVENOUS | Status: AC
  Administered 2018-05-22: 1000 mL via INTRAVENOUS

## 2018-05-22 NOTE — ED Notes (Signed)
Transfer team from [facility] arrived to transfer pt to [facility]

## 2018-05-22 NOTE — ED Provider Notes (Signed)
4:39 PM Reassessed patient and her vital signs are within normal limits.  She is alert and oriented.  She is uncomfortable with the bed but overall much more comfortable than on arrival here.  BP slight trending down, but suspect it is because of improved pain and some vasodilation/histamine release from Morphine. No e/o sepsis or instability at this time. She is stable for transfer to [facility] under the care of Dr. Hillery Jacks.  Vitals:   05/22/18 1121 05/22/18 1201 05/22/18 1352 05/22/18 1448  BP: (!) 170/81 (!) 157/74 (!) 156/68 (!) 117/94  Pulse: 95 88 89 87  Resp: 18  17 18   Temp:      TempSrc:      SpO2: 99% 94% 99% 98%  Weight:      Height:          Merrily Pew, MD 05/22/18 1640

## 2018-05-22 NOTE — ED Notes (Signed)
Called [facility] to request consult to hospitalist.  Spoke with Lysbeth Galas

## 2018-05-22 NOTE — ED Notes (Signed)
Patient transported to X-ray 

## 2018-05-22 NOTE — Telephone Encounter (Signed)
Noted agree

## 2018-05-22 NOTE — ED Provider Notes (Signed)
[facility] EMERGENCY DEPARTMENT Provider Note   CSN: [id number] Arrival date & time: 05/22/18  1022    History   Chief Complaint Chief Complaint  Patient presents with  . Groin Pain    HPI Tiffany [name] is a 85 y.o. female.     Pt presents to the ED today with right groin pain.  Pain has been there for about 2 weeks.  She did slide out of her chair before the pain started.  Pt has a wheelchair and a walker at home, but she has not been able to put any weight on her right leg.     Past Medical History:  Diagnosis Date  . ALLERGIC RHINITIS 11/21/2006  . At high risk for aspiration 12/2017   Progressive oral pharyngeal dysfunction/dysphagia: pulm recommended daily vest with cough assist device + albuterol nebulized  . BACK PAIN 12/17/2006  . COLONIC POLYPS, HX OF 11/21/2006  . HYPERTENSION 11/21/2006  . Moderate aortic stenosis   . OSTEOPENIA 08/11/2009  . OSTEOPOROSIS 11/21/2006  . Parkinson disease ([place])   . PEDAL EDEMA 07/05/2008  . Progressive supranuclear palsy (HCC)    Dr. Carles Collet is her neurologist.  Dr. Carles Collet plans did botox injections fo salivary glands 01/28/18-->good response.  Jiles Garter MURMUR 7/82/9562    Patient Active Problem List   Diagnosis Date Noted  . Right shoulder injury, initial encounter 08/11/2017  . Dysphagia, neurologic 07/26/2016  . PSP (progressive supranuclear palsy) ([place]) 07/15/2015  . Aortic stenosis 06/09/2015  . Dysphasia 06/09/2015  . OSTEOPENIA 08/11/2009  . PEDAL EDEMA 07/05/2008  . BACK PAIN 12/17/2006  . Essential hypertension 11/21/2006  . Allergic rhinitis 11/21/2006  . Osteoporosis 11/21/2006  . History of colonic polyps 11/21/2006    Past Surgical History:  Procedure Laterality Date  . BREAST EXCISIONAL BIOPSY Right    benign  . BREAST SURGERY     bx  . CATARACT EXTRACTION Bilateral 2012  . HEMORRHOID SURGERY    . TONSILLECTOMY    . TRANSTHORACIC ECHOCARDIOGRAM  04/15/2017   EF 55-60%, mild AS (no change  from 2017), grd I DD.  Repeat 2 yrs planned.     OB History   No obstetric history on file.      Home Medications    Prior to Admission medications   Medication Sig Start Date End Date Taking? Authorizing Provider  albuterol (PROVENTIL) (2.5 MG/3ML) 0.083% nebulizer solution Take 3 mLs (2.5 mg total) by nebulization every 4 (four) hours as needed for wheezing or shortness of breath. 01/16/18  Yes Icard, Bradley L, DO  amLODipine (NORVASC) 10 MG tablet 1/2 tab po qd 01/22/18  Yes McGowen, Adrian Blackwater, MD  aspirin 81 MG tablet Take 81 mg by mouth daily.     Yes [provider]  atorvastatin (LIPITOR) 10 MG tablet Take 1 tablet (10 mg total) by mouth daily. 12/25/17  Yes McGowen, Adrian Blackwater, MD  benazepril (LOTENSIN) 40 MG tablet TAKE 1 TABLET EVERY DAY 10/02/17  Yes Marletta Lor, MD  Calcium Carbonate (CALTRATE 600) 1500 MG TABS Take by mouth 2 (two) times daily.     Yes [provider]  carbidopa-levodopa (SINEMET IR) 25-100 MG tablet TAKE 2 TABLETS IN THE MORNING, 1 TABLET  IN  THE  AFTERNOON AND 1 TABLET IN THE EVENING 02/12/18  Yes Tat, Eustace Quail, DO  Cholecalciferol (VITAMIN D) 1000 UNITS capsule Take 1,000 Units by mouth daily.     Yes [provider]  clonazePAM (KLONOPIN) 0.5 MG tablet  TAKE 1/2-1 TABLET BY MOUTH TWICE A DAY AS NEEDED 04/14/18  Yes McGowen, Adrian Blackwater, MD  Multiple Vitamin (MULTIVITAMIN) tablet Take 1 tablet by mouth daily.     Yes [provider]  Omega-3 Fatty Acids (FISH OIL) 1200 MG CAPS Take 1 capsule by mouth daily.   Yes [provider]  pantoprazole (PROTONIX) 40 MG tablet TAKE 1 TABLET BY MOUTH EVERY DAY 05/05/18  Yes McGowen, Adrian Blackwater, MD  amLODipine (NORVASC) 10 MG tablet Take 0.5 tablets (5 mg total) by mouth daily. 02/05/18   McGowen, Adrian Blackwater, MD    Family History History reviewed. No pertinent family history.  Social History Social History   Tobacco Use  . Smoking status: Former Smoker    Last attempt  to quit: 03/27/1995    Years since quitting: 23.1  . Smokeless tobacco: Never Used  Substance Use Topics  . Alcohol use: No    Alcohol/week: 0.0 standard drinks  . Drug use: No     Allergies   Codeine phosphate   Review of Systems Review of Systems  Musculoskeletal:       Right hip pain  All other systems reviewed and are negative.    Physical Exam Updated Vital Signs BP (!) 156/68 (BP Location: Right Arm)   Pulse 89   Temp 98.1 F (36.7 C) (Oral)   Resp 17   Ht 5\' 4"  (1.626 m)   Wt 61.7 kg   SpO2 99%   BMI 23.34 kg/m   Physical Exam Vitals signs and nursing note reviewed.  Constitutional:      Appearance: Normal appearance.  HENT:     Head: Normocephalic and atraumatic.     Right Ear: External ear normal.     Left Ear: External ear normal.     Nose: Nose normal.     Mouth/Throat:     Mouth: Mucous membranes are moist.  Eyes:     Extraocular Movements: Extraocular movements intact.     Pupils: Pupils are equal, round, and reactive to light.  Neck:     Musculoskeletal: Normal range of motion and neck supple.  Cardiovascular:     Rate and Rhythm: Normal rate and regular rhythm.     Pulses: Normal pulses.     Heart sounds: Normal heart sounds.  Pulmonary:     Effort: Pulmonary effort is normal.     Breath sounds: Normal breath sounds.  Abdominal:     General: Abdomen is flat.  Musculoskeletal:     Right hip: She exhibits tenderness.  Skin:    General: Skin is warm and dry.     Capillary Refill: Capillary refill takes less than 2 seconds.  Neurological:     General: No focal deficit present.     Mental Status: She is alert and oriented to person, place, and time.  Psychiatric:        Mood and Affect: Mood normal.        Behavior: Behavior normal.      ED Treatments / Results  Labs (all labs ordered are listed, but only abnormal results are displayed) Labs Reviewed  COMPREHENSIVE METABOLIC PANEL - Abnormal; Notable for the following components:        Result Value   Glucose, Bld 116 (*)    GFR calc non Af Amer 56 (*)    All other components within normal limits  CBC WITH DIFFERENTIAL/PLATELET - Abnormal; Notable for the following components:   WBC 10.9 (*)    Neutro Abs 9.4 (*)  All other components within normal limits  URINALYSIS, ROUTINE W REFLEX MICROSCOPIC - Abnormal; Notable for the following components:   Leukocytes,Ua TRACE (*)    All other components within normal limits  URINALYSIS, MICROSCOPIC (REFLEX) - Abnormal; Notable for the following components:   Bacteria, UA RARE (*)    All other components within normal limits  CK - Abnormal; Notable for the following components:   Total CK 274 (*)    All other components within normal limits  URINE CULTURE  SEDIMENTATION RATE    EKG None  Radiology Ct Hip Right Wo Contrast  Result Date: 05/22/2018 CLINICAL DATA:  Right groin pain for 2 weeks.  No known injury. EXAM: CT OF THE RIGHT HIP WITHOUT CONTRAST TECHNIQUE: Multidetector CT imaging of the right hip was performed according to the standard protocol. Multiplanar CT image reconstructions were also generated. COMPARISON:  None. FINDINGS: Bones/Joint/Cartilage There is no acute bony or joint abnormality. No focal lesion. A few tiny subchondral cysts are seen in the acetabulum. There is some osteophytosis about the symphysis pubis. No avascular necrosis of the femoral head. Mild joint space narrowing about the hip is most notable posteriorly. Ligaments Suboptimally assessed by CT. Muscles and Tendons No tear is identified. There stranding in the soft tissues of the right groin and about musculature anterior to the right hip, most notable at the iliopsoas muscle. Within the pelvis, the iliacus is markedly edematous on the right compared to the left. Stranding is also seen and anterior musculature of the right hip, most notable at the sartorius. No focal fluid collection is identified. Soft tissues Atherosclerotic vascular  disease is seen in the imaged intrapelvic contents. Diverticulosis is noted. IMPRESSION: Edema the right iliacus and iliopsoas muscles with stranding in the anterior aspect of the right upper leg most worrisome for cellulitis and myositis. Likely myositis of the sartorius also noted. No abscess is identified. No evidence of osteomyelitis by CT scan. MRI with and without contrast is a better test to detect osteomyelitis and intramuscular abscess. Negative for fracture or other acute bony abnormality. Electronically Signed   By: Inge Rise M.D.   On: 05/22/2018 12:39   Dg Hip Unilat W Or Wo Pelvis 2-3 Views Right  Result Date: 05/22/2018 CLINICAL DATA:  Right groin pain for 2 weeks without injury. EXAM: DG HIP (WITH OR WITHOUT PELVIS) 2-3V RIGHT COMPARISON:  None. FINDINGS: There is no evidence of acute fracture or hip dislocation. No significant arthropathic changes are seen of the hips for age. Scoliosis and degenerative changes are partially visualized in the lower lumbar spine. Small calcified granulomas are noted in the flanks. IMPRESSION: No evidence of acute osseous abnormality or significant hip arthropathy. Electronically Signed   By: Logan Bores M.D.   On: 05/22/2018 11:48    Procedures Procedures (including critical care time)  Medications Ordered in ED Medications  morphine 4 MG/ML injection 4 mg (has no administration in time range)  morphine 2 MG/ML injection 2 mg (2 mg Intravenous Given 05/22/18 1124)  ondansetron (ZOFRAN) injection 4 mg (4 mg Intravenous Given 05/22/18 1120)  sodium chloride 0.9 % bolus 1,000 mL (1,000 mLs Intravenous New Bag/Given 05/22/18 1300)     Initial Impression / Assessment and Plan / ED Course  I have reviewed the triage vital signs and the nursing notes.  Pertinent labs & imaging results that were available during my care of the patient were reviewed by me and considered in my medical decision making (see chart for details).  Pt is given a  dose of ancef in ED and IVFs for cellulitis and mild elevation in CK.  Etiology of myositis is unclear.  Pt is requiring IV pain meds for pain relief, but VSS.  Family requests transfer to [facility] in [place].  I spoke with Dr. Hillery Jacks who is the hospitalist on today.  She accepted pt for transfer.  Final Clinical Impressions(s) / ED Diagnoses   Final diagnoses:  Myositis of right thigh, unspecified myositis type  Ambulatory dysfunction    ED Discharge Orders    None       Isla Pence, MD 05/22/18 1432

## 2018-05-22 NOTE — ED Notes (Signed)
Patient transported to CT 

## 2018-05-22 NOTE — ED Triage Notes (Signed)
Reports right groin pain x 2 weeks without injury.

## 2018-05-22 NOTE — Telephone Encounter (Signed)
SW patient regarding groin pain (pt requesting appt today for severe groin pain, unable to lift leg).  Patient declines injury, increasing right groin pain x several days. Lifting leg causes severe pain.  Advised patient to go to ER for evaluation and pain management. Patient verbalized understanding, planning to go to [facility] ER.

## 2018-05-23 ENCOUNTER — Ambulatory Visit: Payer: Medicare HMO | Admitting: Neurology

## 2018-05-23 LAB — URINE CULTURE: Culture: NO GROWTH

## 2018-05-30 DIAGNOSIS — G20A1 Parkinson's disease without dyskinesia, without mention of fluctuations: Secondary | ICD-10-CM | POA: Insufficient documentation

## 2018-05-30 DIAGNOSIS — G20 Parkinson's disease: Secondary | ICD-10-CM | POA: Insufficient documentation

## 2018-05-31 DIAGNOSIS — R4702 Dysphasia: Secondary | ICD-10-CM | POA: Diagnosis not present

## 2018-05-31 DIAGNOSIS — G231 Progressive supranuclear ophthalmoplegia [Steele-Richardson-Olszewski]: Secondary | ICD-10-CM | POA: Diagnosis not present

## 2018-05-31 DIAGNOSIS — J986 Disorders of diaphragm: Secondary | ICD-10-CM | POA: Diagnosis not present

## 2018-05-31 MED ORDER — SODIUM CHLORIDE 0.9 % IV SOLN
10.00 | INTRAVENOUS | Status: DC
Start: ? — End: 2018-05-31

## 2018-05-31 MED ORDER — HYDROCODONE-ACETAMINOPHEN 5-325 MG PO TABS
1.00 | ORAL_TABLET | ORAL | Status: DC
Start: ? — End: 2018-05-31

## 2018-05-31 MED ORDER — CARBIDOPA-LEVODOPA ER 25-100 MG PO TBCR
2.00 | EXTENDED_RELEASE_TABLET | ORAL | Status: DC
Start: 2018-05-31 — End: 2018-05-31

## 2018-05-31 MED ORDER — ASPIRIN 81 MG PO CHEW
81.00 | CHEWABLE_TABLET | ORAL | Status: DC
Start: 2018-05-31 — End: 2018-05-31

## 2018-05-31 MED ORDER — ALUM & MAG HYDROXIDE-SIMETH 200-200-20 MG/5ML PO SUSP
30.00 | ORAL | Status: DC
Start: ? — End: 2018-05-31

## 2018-05-31 MED ORDER — NITROGLYCERIN 0.4 MG SL SUBL
0.40 | SUBLINGUAL_TABLET | SUBLINGUAL | Status: DC
Start: ? — End: 2018-05-31

## 2018-05-31 MED ORDER — HYDROCODONE-ACETAMINOPHEN 10-325 MG PO TABS
1.00 | ORAL_TABLET | ORAL | Status: DC
Start: ? — End: 2018-05-31

## 2018-05-31 MED ORDER — PANTOPRAZOLE SODIUM 40 MG PO TBEC
40.00 | DELAYED_RELEASE_TABLET | ORAL | Status: DC
Start: 2018-05-31 — End: 2018-05-31

## 2018-05-31 MED ORDER — IPRATROPIUM-ALBUTEROL 0.5-2.5 (3) MG/3ML IN SOLN
3.00 | RESPIRATORY_TRACT | Status: DC
Start: ? — End: 2018-05-31

## 2018-05-31 MED ORDER — ENOXAPARIN SODIUM 40 MG/0.4ML ~~LOC~~ SOLN
40.00 | SUBCUTANEOUS | Status: DC
Start: 2018-05-30 — End: 2018-05-31

## 2018-05-31 MED ORDER — CARBIDOPA-LEVODOPA ER 25-100 MG PO TBCR
1.00 | EXTENDED_RELEASE_TABLET | ORAL | Status: DC
Start: 2018-05-30 — End: 2018-05-31

## 2018-05-31 MED ORDER — GENERIC EXTERNAL MEDICATION
10.00 | Status: DC
Start: ? — End: 2018-05-31

## 2018-05-31 MED ORDER — CLONAZEPAM 0.5 MG PO TABS
0.50 | ORAL_TABLET | ORAL | Status: DC
Start: ? — End: 2018-05-31

## 2018-05-31 MED ORDER — METOPROLOL TARTRATE 5 MG/5ML IV SOLN
5.00 | INTRAVENOUS | Status: DC
Start: ? — End: 2018-05-31

## 2018-05-31 MED ORDER — ACETAMINOPHEN 325 MG PO TABS
650.00 | ORAL_TABLET | ORAL | Status: DC
Start: ? — End: 2018-05-31

## 2018-05-31 MED ORDER — GENERIC EXTERNAL MEDICATION
4.00 | Status: DC
Start: ? — End: 2018-05-31

## 2018-05-31 MED ORDER — SENNA-DOCUSATE SODIUM 8.6-50 MG PO TABS
1.00 | ORAL_TABLET | ORAL | Status: DC
Start: ? — End: 2018-05-31

## 2018-05-31 MED ORDER — AMLODIPINE BESYLATE 5 MG PO TABS
5.00 | ORAL_TABLET | ORAL | Status: DC
Start: 2018-05-31 — End: 2018-05-31

## 2018-05-31 MED ORDER — GENERIC EXTERNAL MEDICATION
650.00 | Status: DC
Start: ? — End: 2018-05-31

## 2018-06-01 DIAGNOSIS — G231 Progressive supranuclear ophthalmoplegia [Steele-Richardson-Olszewski]: Secondary | ICD-10-CM | POA: Diagnosis not present

## 2018-06-01 DIAGNOSIS — R471 Dysarthria and anarthria: Secondary | ICD-10-CM | POA: Diagnosis not present

## 2018-06-01 DIAGNOSIS — G20 Parkinson's disease: Secondary | ICD-10-CM | POA: Diagnosis not present

## 2018-06-01 DIAGNOSIS — R1312 Dysphagia, oropharyngeal phase: Secondary | ICD-10-CM | POA: Diagnosis not present

## 2018-06-01 DIAGNOSIS — F0390 Unspecified dementia without behavioral disturbance: Secondary | ICD-10-CM | POA: Diagnosis not present

## 2018-06-01 DIAGNOSIS — M60003 Infective myositis, unspecified right leg: Secondary | ICD-10-CM | POA: Diagnosis not present

## 2018-06-02 ENCOUNTER — Telehealth: Payer: Self-pay | Admitting: Family Medicine

## 2018-06-02 DIAGNOSIS — M60003 Infective myositis, unspecified right leg: Secondary | ICD-10-CM | POA: Diagnosis not present

## 2018-06-02 DIAGNOSIS — R471 Dysarthria and anarthria: Secondary | ICD-10-CM | POA: Diagnosis not present

## 2018-06-02 DIAGNOSIS — R1312 Dysphagia, oropharyngeal phase: Secondary | ICD-10-CM | POA: Diagnosis not present

## 2018-06-02 DIAGNOSIS — F0390 Unspecified dementia without behavioral disturbance: Secondary | ICD-10-CM | POA: Diagnosis not present

## 2018-06-02 DIAGNOSIS — G231 Progressive supranuclear ophthalmoplegia [Steele-Richardson-Olszewski]: Secondary | ICD-10-CM | POA: Diagnosis not present

## 2018-06-02 DIAGNOSIS — G20 Parkinson's disease: Secondary | ICD-10-CM | POA: Diagnosis not present

## 2018-06-02 NOTE — Telephone Encounter (Signed)
Spoke with Drue Dun ([place]) and made her aware pt will need to be seen in office before approving verbal orders.

## 2018-06-02 NOTE — Telephone Encounter (Signed)
Copied from [place] [phone]. Topic: Quick Communication - Home Health Verbal Orders >> Jun 02, 2018 10:41 AM Lennox Solders wrote: Caller/Agency: [facility]- adv home health Callback Number:[phone] Requesting Skilled Nursing and home health aid  1x3

## 2018-06-03 ENCOUNTER — Telehealth: Payer: Self-pay | Admitting: Family Medicine

## 2018-06-03 ENCOUNTER — Telehealth: Payer: Self-pay | Admitting: Neurology

## 2018-06-03 NOTE — Telephone Encounter (Signed)
Copied from [place] [phone]. Topic: Quick Communication - Home Health Verbal Orders >> Jun 03, 2018  8:40 AM Alanda Slim E wrote: Caller/Agency: Shirlee Latch / [place] Number: [phone] Speech Therapy Eval was done on 06/02/18  Pt has a long history of swallowing issues

## 2018-06-03 NOTE — Telephone Encounter (Signed)
Tiffany [name] advised that we have not seen pt since October 2019 and to contact pts neurologist for orders. She voiced understanding.

## 2018-06-03 NOTE — Telephone Encounter (Signed)
Received call from [place], [facility], asking for verbal orders for skilled nursing to see patient 1xweek for 3 weeks for medication management/disease teaching and to have nurse aide help with showers, etc. Verbal order given. [phone].

## 2018-06-04 ENCOUNTER — Telehealth: Payer: Self-pay | Admitting: Neurology

## 2018-06-04 DIAGNOSIS — F0390 Unspecified dementia without behavioral disturbance: Secondary | ICD-10-CM | POA: Diagnosis not present

## 2018-06-04 DIAGNOSIS — M60003 Infective myositis, unspecified right leg: Secondary | ICD-10-CM | POA: Diagnosis not present

## 2018-06-04 DIAGNOSIS — G20 Parkinson's disease: Secondary | ICD-10-CM | POA: Diagnosis not present

## 2018-06-04 DIAGNOSIS — R471 Dysarthria and anarthria: Secondary | ICD-10-CM | POA: Diagnosis not present

## 2018-06-04 DIAGNOSIS — G231 Progressive supranuclear ophthalmoplegia [Steele-Richardson-Olszewski]: Secondary | ICD-10-CM | POA: Diagnosis not present

## 2018-06-04 DIAGNOSIS — R1312 Dysphagia, oropharyngeal phase: Secondary | ICD-10-CM | POA: Diagnosis not present

## 2018-06-04 NOTE — Telephone Encounter (Signed)
Received call from [facility] with [facility] [phone]) letting us know patient had a controlled fall (aide was with her and helped lower her to the ground). EMS was called and she was checked out. No symptoms.

## 2018-06-06 DIAGNOSIS — G231 Progressive supranuclear ophthalmoplegia [Steele-Richardson-Olszewski]: Secondary | ICD-10-CM | POA: Diagnosis not present

## 2018-06-06 DIAGNOSIS — G20 Parkinson's disease: Secondary | ICD-10-CM | POA: Diagnosis not present

## 2018-06-06 DIAGNOSIS — R1312 Dysphagia, oropharyngeal phase: Secondary | ICD-10-CM | POA: Diagnosis not present

## 2018-06-06 DIAGNOSIS — R471 Dysarthria and anarthria: Secondary | ICD-10-CM | POA: Diagnosis not present

## 2018-06-06 DIAGNOSIS — M60003 Infective myositis, unspecified right leg: Secondary | ICD-10-CM | POA: Diagnosis not present

## 2018-06-06 DIAGNOSIS — F0390 Unspecified dementia without behavioral disturbance: Secondary | ICD-10-CM | POA: Diagnosis not present

## 2018-06-09 ENCOUNTER — Telehealth: Payer: Self-pay | Admitting: Neurology

## 2018-06-09 NOTE — Telephone Encounter (Signed)
Left message on machine for Allie, OT, to call back.

## 2018-06-09 NOTE — Telephone Encounter (Signed)
Needing Verbal Orders for OT. Thanks

## 2018-06-09 NOTE — Telephone Encounter (Signed)
Gave verbal okay to [place], OT, to see patient.

## 2018-06-10 ENCOUNTER — Telehealth: Payer: Self-pay | Admitting: Neurology

## 2018-06-10 NOTE — Telephone Encounter (Signed)
FYI

## 2018-06-10 NOTE — Telephone Encounter (Signed)
Bree called and is needing to stop visits for 3 weeks for the Coronavirus and wanting to let the Doctor know. Thanks

## 2018-06-20 ENCOUNTER — Encounter: Payer: Self-pay | Admitting: Neurology

## 2018-06-20 NOTE — Progress Notes (Signed)
Myobloc approval valid through 03/27/2019. Patient to use Buy and Bill.  

## 2018-06-23 ENCOUNTER — Other Ambulatory Visit: Payer: Self-pay | Admitting: Family Medicine

## 2018-06-23 MED ORDER — BENAZEPRIL HCL 40 MG PO TABS: 40.0000 mg | ORAL_TABLET | Freq: Every day | ORAL | 1 refills | Status: DC

## 2018-06-23 NOTE — Telephone Encounter (Signed)
Requested Prescriptions  Pending Prescriptions Disp Refills  . benazepril (LOTENSIN) 40 MG tablet 90 tablet 1    Sig: Take 1 tablet (40 mg total) by mouth daily.     Cardiovascular:  ACE Inhibitors Failed - 06/23/2018  2:33 PM      Failed - Last BP in normal range    BP Readings from Last 1 Encounters:  05/22/18 (!) 147/79         Passed - Cr in normal range and within 180 days    Creat  Date Value Ref Range Status  06/13/2015 1.10 (H) 0.60 - 0.88 mg/dL Final   Creatinine, Ser  Date Value Ref Range Status  05/22/2018 0.94 0.44 - 1.00 mg/dL Final         Passed - K in normal range and within 180 days    Potassium  Date Value Ref Range Status  05/22/2018 3.7 3.5 - 5.1 mmol/L Final         Passed - Patient is not pregnant      Passed - Valid encounter within last 6 months    Recent Outpatient Visits          5 months ago Oropharyngeal dysphagia   [place], Adrian Blackwater, MD   5 months ago Essential hypertension   [place], Adrian Blackwater, MD   5 months ago Acute bronchopneumonia   [place], Adrian Blackwater, MD   6 months ago Acute bronchitis, unspecified organism   [place], Adrian Blackwater, MD   8 months ago [facility], Doretha Sou, MD

## 2018-06-24 ENCOUNTER — Telehealth: Payer: Self-pay | Admitting: Neurology

## 2018-06-24 NOTE — Telephone Encounter (Signed)
Linda from home health called and left a VM that patient refused visit due to the COVID 19 going around

## 2018-06-24 NOTE — Telephone Encounter (Signed)
FYI

## 2018-06-28 DIAGNOSIS — J698 Pneumonitis due to inhalation of other solids and liquids: Secondary | ICD-10-CM | POA: Diagnosis not present

## 2018-06-28 DIAGNOSIS — G231 Progressive supranuclear ophthalmoplegia [Steele-Richardson-Olszewski]: Secondary | ICD-10-CM | POA: Diagnosis not present

## 2018-06-28 DIAGNOSIS — R269 Unspecified abnormalities of gait and mobility: Secondary | ICD-10-CM | POA: Diagnosis not present

## 2018-06-28 DIAGNOSIS — J398 Other specified diseases of upper respiratory tract: Secondary | ICD-10-CM | POA: Diagnosis not present

## 2018-06-29 DIAGNOSIS — G231 Progressive supranuclear ophthalmoplegia [Steele-Richardson-Olszewski]: Secondary | ICD-10-CM | POA: Diagnosis not present

## 2018-06-29 DIAGNOSIS — J986 Disorders of diaphragm: Secondary | ICD-10-CM | POA: Diagnosis not present

## 2018-06-29 DIAGNOSIS — R4702 Dysphasia: Secondary | ICD-10-CM | POA: Diagnosis not present

## 2018-07-01 ENCOUNTER — Other Ambulatory Visit: Payer: Self-pay | Admitting: Family Medicine

## 2018-07-01 NOTE — Telephone Encounter (Signed)
Pt called in and stated that this should be sent to [facility] instead of [facility].  And a 90 day supply   [place], [place] [phone] (Phone)

## 2018-07-01 NOTE — Telephone Encounter (Signed)
Requested medication (s) are due for refill today: yes  Requested medication (s) are on the active medication list:  yes  Future visit scheduled:  no Last Refill: 12/25/2017; #14; no refills  Due to COVID 19, was not sure how medications for chronic conditions are being handled; last lipids were 03/2017  Requested Prescriptions  Pending Prescriptions Disp Refills   atorvastatin (LIPITOR) 10 MG tablet 90 tablet 0    Sig: Take 1 tablet (10 mg total) by mouth daily.     Cardiovascular:  Antilipid - Statins Failed - 07/01/2018 12:20 PM      Failed - Total Cholesterol in normal range and within 360 days    Cholesterol  Date Value Ref Range Status  03/28/2017 135 0 - 200 mg/dL Final    Comment:    ATP III Classification       Desirable:  < 200 mg/dL               Borderline High:  200 - 239 mg/dL          High:  > = 240 mg/dL         Failed - LDL in normal range and within 360 days    LDL Cholesterol  Date Value Ref Range Status  03/28/2017 46 0 - 99 mg/dL Final         Failed - HDL in normal range and within 360 days    HDL  Date Value Ref Range Status  03/28/2017 63.10 >39.00 mg/dL Final         Failed - Triglycerides in normal range and within 360 days    Triglycerides  Date Value Ref Range Status  03/28/2017 129.0 0.0 - 149.0 mg/dL Final    Comment:    Normal:  <150 mg/dLBorderline High:  150 - 199 mg/dL         Passed - Patient is not pregnant      Passed - Valid encounter within last 12 months    Recent Outpatient Visits          5 months ago Oropharyngeal dysphagia   [place], Adrian Blackwater, MD   5 months ago Essential hypertension   [place], Adrian Blackwater, MD   6 months ago Acute bronchopneumonia   [facility], Adrian Blackwater, MD   6 months ago Acute bronchitis, unspecified organism   [place], Adrian Blackwater, MD   9 months ago [place] [facility], Doretha Sou, MD

## 2018-07-02 MED ORDER — ATORVASTATIN CALCIUM 10 MG PO TABS: 10.0000 mg | ORAL_TABLET | Freq: Every day | ORAL | 1 refills | Status: DC

## 2018-07-02 NOTE — Telephone Encounter (Signed)
RF request for atorvastatin.  Last filled 01/22/18 # 14 w/ no refill.   Patient was due for follow up 02/2018.   Please advise?

## 2018-07-28 DIAGNOSIS — G231 Progressive supranuclear ophthalmoplegia [Steele-Richardson-Olszewski]: Secondary | ICD-10-CM | POA: Diagnosis not present

## 2018-07-28 DIAGNOSIS — J398 Other specified diseases of upper respiratory tract: Secondary | ICD-10-CM | POA: Diagnosis not present

## 2018-07-28 DIAGNOSIS — R269 Unspecified abnormalities of gait and mobility: Secondary | ICD-10-CM | POA: Diagnosis not present

## 2018-07-28 DIAGNOSIS — J698 Pneumonitis due to inhalation of other solids and liquids: Secondary | ICD-10-CM | POA: Diagnosis not present

## 2018-07-29 DIAGNOSIS — G231 Progressive supranuclear ophthalmoplegia [Steele-Richardson-Olszewski]: Secondary | ICD-10-CM | POA: Diagnosis not present

## 2018-07-29 DIAGNOSIS — J986 Disorders of diaphragm: Secondary | ICD-10-CM | POA: Diagnosis not present

## 2018-07-29 DIAGNOSIS — R4702 Dysphasia: Secondary | ICD-10-CM | POA: Diagnosis not present

## 2018-08-07 ENCOUNTER — Other Ambulatory Visit: Payer: Self-pay | Admitting: Family Medicine

## 2018-08-08 NOTE — Telephone Encounter (Signed)
Patient's daughter called to check on Rx for [facility]. Advised patient to contact the pharmacy this afternoon to check the status of the Rx. Patient only has 3 pills left.

## 2018-08-08 NOTE — Telephone Encounter (Signed)
RF request for Clonazepam LOV: 01/22/18 Next ov: advised to f/u 2 months Last written: 04/14/18 (60,0) No CSC or UDS  No PMP aware data found. Pt has 3 pills left. Medication pending, please advise thanks.

## 2018-08-18 ENCOUNTER — Other Ambulatory Visit: Payer: Self-pay | Admitting: Neurology

## 2018-08-19 NOTE — Telephone Encounter (Signed)
Requested Prescriptions   Pending Prescriptions Disp Refills  . carbidopa-levodopa (SINEMET IR) 25-100 MG tablet [Pharmacy Med Name: CARBIDOPA/LEVODOPA 25-100 MG Tablet] 360 tablet 1    Sig: TAKE 2 TABLETS IN THE MORNING, 1 TABLET  IN  THE  AFTERNOON AND 1 TABLET IN THE EVENING   Rx last filled:02/12/18 #360 1 REFILLLS  Pt last seen: 02/25/18  Follow up appt scheduled:08/29/18  APPROVED

## 2018-08-28 DIAGNOSIS — R269 Unspecified abnormalities of gait and mobility: Secondary | ICD-10-CM | POA: Diagnosis not present

## 2018-08-28 DIAGNOSIS — J398 Other specified diseases of upper respiratory tract: Secondary | ICD-10-CM | POA: Diagnosis not present

## 2018-08-28 DIAGNOSIS — G231 Progressive supranuclear ophthalmoplegia [Steele-Richardson-Olszewski]: Secondary | ICD-10-CM | POA: Diagnosis not present

## 2018-08-28 DIAGNOSIS — J698 Pneumonitis due to inhalation of other solids and liquids: Secondary | ICD-10-CM | POA: Diagnosis not present

## 2018-08-29 ENCOUNTER — Encounter: Payer: Self-pay | Admitting: Neurology

## 2018-08-29 ENCOUNTER — Ambulatory Visit (INDEPENDENT_AMBULATORY_CARE_PROVIDER_SITE_OTHER): Payer: Medicare HMO | Admitting: Neurology

## 2018-08-29 ENCOUNTER — Other Ambulatory Visit: Payer: Self-pay

## 2018-08-29 DIAGNOSIS — G231 Progressive supranuclear ophthalmoplegia [Steele-Richardson-Olszewski]: Secondary | ICD-10-CM | POA: Diagnosis not present

## 2018-08-29 DIAGNOSIS — K117 Disturbances of salivary secretion: Secondary | ICD-10-CM | POA: Diagnosis not present

## 2018-08-29 DIAGNOSIS — R4702 Dysphasia: Secondary | ICD-10-CM | POA: Diagnosis not present

## 2018-08-29 DIAGNOSIS — J986 Disorders of diaphragm: Secondary | ICD-10-CM | POA: Diagnosis not present

## 2018-08-29 MED ORDER — RIMABOTULINUMTOXINB 5000 UNIT/ML IM SOLN
5000.0000 [IU] | Freq: Once | INTRAMUSCULAR | Status: AC
  Administered 2018-08-29: 5000 [IU] via INTRAMUSCULAR

## 2018-08-29 NOTE — Procedures (Signed)
[facility]    History:  Diagnosis: Sialorrhea    Site confirmed:yes Consent obtained: yest     Injections  Location Left  Right Units Number of sites             Parotid 2500 2500 5000 1 per side  TOTAL UNITS:     5000      Type of Toxin: Myobloc type B As ordered and injected IM at today's visit Total Units: 5000  Discarded Units: 0  Needle drawback with each injection was free of blood. Pt tolerated procedure well without complications.   Reinjection is anticipated in 3 months.

## 2018-08-29 NOTE — Progress Notes (Deleted)
Virtual Visit via Video Note The purpose of this virtual visit is to provide medical care while limiting exposure to the novel coronavirus.    Consent was obtained for video visit:  {yes no:314532} Answered questions that patient had about telehealth interaction:  {yes no:314532} I discussed the limitations, risks, security and privacy concerns of performing an evaluation and management service by telemedicine. I also discussed with the patient that there may be a patient responsible charge related to this service. The patient expressed understanding and agreed to proceed.  Pt location: Home Physician Location: office Name of referring provider:  Tammi Sou, MD I connected with Priscille Heidelberg at patients initiation/request on 09/01/2018 at 11:15 AM EDT by video enabled telemedicine application and verified that I am speaking with the correct person using two identifiers. Pt MRN:  [id number] Pt DOB:  Oct 08, 1932 Video Participants:  Priscille Heidelberg;  ***   History of Present Illness: *** Patient seen today for her history of PSP, accompanied by ***who supplies most of the history.  Patients medical records have been reviewed.  Patient was in the hospital at the end of February for myositis.  She had apparently fallen out of her chair 2 weeks prior to presentation and then had increasing pain in the right leg.  This was worse with ambulation.  ED work-up stated "possible myositis of iliopsoas and psoas muscle."  CT abdomen pelvis demonstrated mild thickening of right iliopsoas muscle with some adjacent inflammatory stranding.  MRI was recommended, but patient was unable to do an MRI because of the positioning and inability to lay on her affected side.  CK was at a high of 969.  It apparently normalized per records.  ***Today is that they think she has headache without the head pain, because she has vision trouble.   Current Outpatient Medications on File Prior to Visit  Medication Sig  Dispense Refill  . albuterol (PROVENTIL) (2.5 MG/3ML) 0.083% nebulizer solution Take 3 mLs (2.5 mg total) by nebulization every 4 (four) hours as needed for wheezing or shortness of breath. 75 mL 5  . amLODipine (NORVASC) 10 MG tablet 1/2 tab po qd 30 tablet 0  . amLODipine (NORVASC) 10 MG tablet Take 0.5 tablets (5 mg total) by mouth daily. 15 tablet 5  . aspirin 81 MG tablet Take 81 mg by mouth daily.      Marland Kitchen atorvastatin (LIPITOR) 10 MG tablet Take 1 tablet (10 mg total) by mouth daily. 90 tablet 1  . benazepril (LOTENSIN) 40 MG tablet Take 1 tablet (40 mg total) by mouth daily. 90 tablet 1  . Calcium Carbonate (CALTRATE 600) 1500 MG TABS Take by mouth 2 (two) times daily.      . carbidopa-levodopa (SINEMET IR) 25-100 MG tablet TAKE 2 TABLETS IN THE MORNING, 1 TABLET  IN  THE  AFTERNOON AND 1 TABLET IN THE EVENING 360 tablet 1  . Cholecalciferol (VITAMIN D) 1000 UNITS capsule Take 1,000 Units by mouth daily.      . clonazePAM (KLONOPIN) 0.5 MG tablet TAKE 1/2 TO 1 TABLET BY MOUTH TWICE A DAY AS NEEDED 60 tablet 0  . Multiple Vitamin (MULTIVITAMIN) tablet Take 1 tablet by mouth daily.      . Omega-3 Fatty Acids (FISH OIL) 1200 MG CAPS Take 1 capsule by mouth daily.    . pantoprazole (PROTONIX) 40 MG tablet TAKE 1 TABLET BY MOUTH EVERY DAY 30 tablet 3   No current facility-administered medications on file prior to visit.  Observations/Objective:   There were no vitals filed for this visit. GEN:  The patient appears stated age and is in NAD.  Neurological examination:  Orientation: The patient is alert and oriented x3. Cranial nerves: There is good facial symmetry. There is ***facial hypomimia.  The speech is fluent and clear. Soft palate rises symmetrically and there is no tongue deviation. Hearing is intact to conversational tone. Motor: Strength is at least antigravity x 4.   Shoulder shrug is equal and symmetric.  There is no pronator drift.  Movement examination: Tone: unable  Abnormal movements: *** Coordination:  There is *** decremation with RAM's, *** Gait and Station: The patient has *** difficulty arising out of a deep-seated chair without the use of the hands. The patient's stride length is ***.      Assessment and Plan:   1.  PSP             -She had a dat scan done on 07/05/2015 with abnormal significant bilateral reduction and putamen uptake with activity largely confined to the caudate nuclei bilaterally.             -Continue carbidopa/levodopa 25/100 2/1/1.  Told caregiver to give carbidopa/levodopa 25/100 prior to getting OOB and then do exercises and then try to get OOB 30 min later to see if helps.  They think that she is having trouble in the AM moving compared to other times of the day.  I did tell them that I really do not like her walking anyway, but they are walking right behind her to make sure that she does not fall.             -Patient asked me if she could expect blindness with the disease.  I explained to her that this does not cause blindness, but rather diplopia and visual restriction because of trouble moving the eyes. 2.  Depression             -Currently off of her Lexapro and thinks that she is doing well.  She will let me know if she feels differently. 3.  Mild cognitive impairment             -She had neuropsych testing with Dr. Si Raider on 10/26/2015.  There was evidence of mild cognitive impairment and depression.  There was no evidence of dementia. 4.  Dysphagia             -Modified barium swallow was completed on June 05, 2017.  Mild oropharyngeal dysphagia was noted.  Dysphagia 3 diet with thin liquids was recommended.               -saw Dr. Valeta Harms.  Appreciate input.  Recommended vest and cough assist device.  Are to get vest later this week 5.  Sialorrhea             -s/p botox injections last month.  Is helping.  They want to hold hospice because of this.  Discussed alternatives, including atropine drops and scopolamine  patches.  For now, they want to continue the injections. 6.  Dizziness             -Amlodipine has been lowered.  Still has some dizziness.  Follow Up Instructions:    -I discussed the assessment and treatment plan with the patient. The patient was provided an opportunity to ask questions and all were answered. The patient agreed with the plan and demonstrated an understanding of the instructions.   The patient was advised  to call back or seek an in-person evaluation if the symptoms worsen or if the condition fails to improve as anticipated.    Total Time spent in visit with the patient was:  ***, of which more than 50% of the time was spent in counseling and/or coordinating care on ***.   Pt understands and agrees with the plan of care outlined.     Tiffany Bogus, DO

## 2018-09-01 ENCOUNTER — Other Ambulatory Visit: Payer: Self-pay

## 2018-09-01 ENCOUNTER — Telehealth: Payer: Medicare HMO | Admitting: Neurology

## 2018-09-09 ENCOUNTER — Other Ambulatory Visit: Payer: Self-pay

## 2018-09-09 ENCOUNTER — Telehealth: Payer: Self-pay | Admitting: Family Medicine

## 2018-09-09 MED ORDER — PANTOPRAZOLE SODIUM 40 MG PO TBEC: 40.0000 mg | DELAYED_RELEASE_TABLET | Freq: Every day | ORAL | 3 refills | Status: DC

## 2018-09-09 NOTE — Telephone Encounter (Signed)
Original Order:  pantoprazole (PROTONIX) 40 MG tablet [[id number]]    Pharmacy:  [id number] - [place], [place] - [address] AT [address]   Refill request

## 2018-09-09 NOTE — Telephone Encounter (Signed)
LM regarding refill request. [facility].

## 2018-09-10 NOTE — Telephone Encounter (Signed)
Patient was notified.

## 2018-09-27 DIAGNOSIS — J398 Other specified diseases of upper respiratory tract: Secondary | ICD-10-CM | POA: Diagnosis not present

## 2018-09-27 DIAGNOSIS — J698 Pneumonitis due to inhalation of other solids and liquids: Secondary | ICD-10-CM | POA: Diagnosis not present

## 2018-09-27 DIAGNOSIS — G231 Progressive supranuclear ophthalmoplegia [Steele-Richardson-Olszewski]: Secondary | ICD-10-CM | POA: Diagnosis not present

## 2018-09-27 DIAGNOSIS — R269 Unspecified abnormalities of gait and mobility: Secondary | ICD-10-CM | POA: Diagnosis not present

## 2018-09-28 DIAGNOSIS — J986 Disorders of diaphragm: Secondary | ICD-10-CM | POA: Diagnosis not present

## 2018-09-28 DIAGNOSIS — R4702 Dysphasia: Secondary | ICD-10-CM | POA: Diagnosis not present

## 2018-09-28 DIAGNOSIS — G231 Progressive supranuclear ophthalmoplegia [Steele-Richardson-Olszewski]: Secondary | ICD-10-CM | POA: Diagnosis not present

## 2018-10-28 DIAGNOSIS — G231 Progressive supranuclear ophthalmoplegia [Steele-Richardson-Olszewski]: Secondary | ICD-10-CM | POA: Diagnosis not present

## 2018-10-28 DIAGNOSIS — R269 Unspecified abnormalities of gait and mobility: Secondary | ICD-10-CM | POA: Diagnosis not present

## 2018-10-28 DIAGNOSIS — J398 Other specified diseases of upper respiratory tract: Secondary | ICD-10-CM | POA: Diagnosis not present

## 2018-10-28 DIAGNOSIS — J698 Pneumonitis due to inhalation of other solids and liquids: Secondary | ICD-10-CM | POA: Diagnosis not present

## 2018-10-29 DIAGNOSIS — J986 Disorders of diaphragm: Secondary | ICD-10-CM | POA: Diagnosis not present

## 2018-10-29 DIAGNOSIS — R4702 Dysphasia: Secondary | ICD-10-CM | POA: Diagnosis not present

## 2018-10-29 DIAGNOSIS — G231 Progressive supranuclear ophthalmoplegia [Steele-Richardson-Olszewski]: Secondary | ICD-10-CM | POA: Diagnosis not present

## 2018-10-31 ENCOUNTER — Other Ambulatory Visit: Payer: Self-pay | Admitting: Family Medicine

## 2018-11-03 ENCOUNTER — Telehealth: Payer: Self-pay | Admitting: Family Medicine

## 2018-11-03 ENCOUNTER — Other Ambulatory Visit: Payer: Self-pay

## 2018-11-03 ENCOUNTER — Telehealth: Payer: Self-pay

## 2018-11-03 MED ORDER — ATORVASTATIN CALCIUM 10 MG PO TABS: 10.0000 mg | ORAL_TABLET | Freq: Every day | ORAL | 0 refills | Status: DC

## 2018-11-03 MED ORDER — ATORVASTATIN CALCIUM 10 MG PO TABS: 10.0000 mg | ORAL_TABLET | Freq: Every day | ORAL | 1 refills | Status: AC

## 2018-11-03 MED ORDER — ATORVASTATIN CALCIUM 10 MG PO TABS: 10.0000 mg | ORAL_TABLET | Freq: Every day | ORAL | 1 refills | Status: DC

## 2018-11-03 NOTE — Telephone Encounter (Signed)
Need to see her for f/u HTN and hyperlipidemia in the next 1-2 mo. I sent in atorva to mail order, but accidentally sent it to her local pharmacy first.  Will you call and cancel the rx to her local pharmacy? thx

## 2018-11-03 NOTE — Telephone Encounter (Signed)
90 day supply cancelled that was sent to her local pharmacy.

## 2018-11-03 NOTE — Telephone Encounter (Signed)
Called patient and spoke to her caregiver. We rcvd refill request for Lipitor. Patient and caregiver stated that she does not take that medication anymore. I noticed the patient hasnt been seen in our office and is due for a f/u. Patient declined at this time.

## 2018-11-03 NOTE — Telephone Encounter (Signed)
Caregiver called back she stated that patient does take Lipitor. Did not realize that was brand name for  atorvastatin (LIPITOR) 10 MG tablet [[id number]  Patient does need refill. She apologized for the confusion.   [place]

## 2018-11-11 ENCOUNTER — Ambulatory Visit (INDEPENDENT_AMBULATORY_CARE_PROVIDER_SITE_OTHER): Payer: Medicare HMO | Admitting: Family Medicine

## 2018-11-11 ENCOUNTER — Other Ambulatory Visit: Payer: Self-pay

## 2018-11-11 ENCOUNTER — Encounter: Payer: Self-pay | Admitting: Family Medicine

## 2018-11-11 VITALS — BP 173/114 | HR 70

## 2018-11-11 DIAGNOSIS — R29898 Other symptoms and signs involving the musculoskeletal system: Secondary | ICD-10-CM | POA: Diagnosis not present

## 2018-11-11 DIAGNOSIS — E7800 Pure hypercholesterolemia, unspecified: Secondary | ICD-10-CM | POA: Diagnosis not present

## 2018-11-11 DIAGNOSIS — F419 Anxiety disorder, unspecified: Secondary | ICD-10-CM

## 2018-11-11 DIAGNOSIS — R2681 Unsteadiness on feet: Secondary | ICD-10-CM

## 2018-11-11 DIAGNOSIS — I10 Essential (primary) hypertension: Secondary | ICD-10-CM

## 2018-11-11 DIAGNOSIS — M79604 Pain in right leg: Secondary | ICD-10-CM

## 2018-11-11 DIAGNOSIS — Z8739 Personal history of other diseases of the musculoskeletal system and connective tissue: Secondary | ICD-10-CM | POA: Diagnosis not present

## 2018-11-11 DIAGNOSIS — G231 Progressive supranuclear ophthalmoplegia [Steele-Richardson-Olszewski]: Secondary | ICD-10-CM | POA: Diagnosis not present

## 2018-11-11 MED ORDER — AMLODIPINE BESYLATE 5 MG PO TABS: 5.0000 mg | ORAL_TABLET | Freq: Every day | ORAL | 3 refills | Status: AC

## 2018-11-11 NOTE — Progress Notes (Signed)
Am

## 2018-11-11 NOTE — Progress Notes (Signed)
Virtual Visit via Video Note  I connected with pt on 11/11/18 at  8:00 AM EDT by telephone (video enabled telemedicine platform not available to pt) and verified that I am speaking with the correct person using two identifiers.  Location patient: home Location provider:work or home office Persons participating in the virtual visit: patient, caregivers Denice Paradise and Benay Spice, and myself.   I discussed the limitations of evaluation and management by telephone and the availability of in person appointments. The patient expressed understanding and agreed to proceed.  Telephone visit is a necessity given the COVID-19 restrictions in place at the current time.  HPI: 85 y/o WF with progressive supranuclear palsy who is being seen today for f/u HTN, HLD, and anxiety. I last saw her in the office 12/2017. Her neurologist is Dr. Carles Collet.  HTN: we are taking a permissive approach to her BP's-->home bp's consistently 120-140s over 70s-90.  HR 68-78. Today's reading not done in restful state.  HLD: tolerating statin w/out problems.  Anxiety: using clonaz bid prn : usually just 1/2 tab qhs.    Level of functioning: still choking quite a bit when eating. Has impaired strength in R leg ever since having myositis about 6 mo ago, + pain.  Puts wt on R leg but "just uses it to spin around" and caregivers state she is extremely high fall risk.  Gets better after taking her sinemet in mornings.  Uses a walker but MUST have caregiver assisting her.  Caregivers help her with ALL ADLs. She won't take her pain meds.  Her speech is getting worse. Bowel habits are fine.    Currently getting no HH nursing, PT, speech, or OT.  This has been as per pt/family preference. However, caregivers are asking for help with PT for her since her R leg is so much more dysfunctional since having myositis.  ROS: no CP, no SOB, no wheezing,no dizziness, no HAs, no rashes, no melena/hematochezia.  No polyuria or polydipsia.    Past Medical  History:  Diagnosis Date  . ALLERGIC RHINITIS 11/21/2006  . At high risk for aspiration 12/2017   Progressive oral pharyngeal dysfunction/dysphagia: pulm recommended daily vest with cough assist device + albuterol nebulized  . BACK PAIN 12/17/2006  . COLONIC POLYPS, HX OF 11/21/2006  . HYPERTENSION 11/21/2006  . Moderate aortic stenosis   . OSTEOPENIA 08/11/2009  . OSTEOPOROSIS 11/21/2006  . Parkinson disease ([place])   . PEDAL EDEMA 07/05/2008  . Progressive supranuclear palsy ([facility])    Dr. Carles Collet is her neurologist.  Dr. Carles Collet plans did botox injections fo salivary glands 01/28/18-->good response.  Jiles Garter MURMUR 09[birth year]    Past Surgical History:  Procedure Laterality Date  . BREAST EXCISIONAL BIOPSY Right    benign  . BREAST SURGERY     bx  . CATARACT EXTRACTION Bilateral 2012  . HEMORRHOID SURGERY    . TONSILLECTOMY    . TRANSTHORACIC ECHOCARDIOGRAM  04/15/2017   EF 55-60%, mild AS (no change from 2017), grd I DD.  Repeat 2 yrs planned.    No family history on file.  Social History   Socioeconomic History  . Marital status: Single    Spouse name: Not on file  . Number of children: Not on file  . Years of education: Not on file  . Highest education level: Not on file  Occupational History  . Occupation: retired    Comment: Geographical information systems officer  . Financial resource strain: Not on file  . Food insecurity  Worry: Not on file    Inability: Not on file  . Transportation needs    Medical: Not on file    Non-medical: Not on file  Tobacco Use  . Smoking status: Former Smoker    Quit date: 03/27/1995    Years since quitting: 23.6  . Smokeless tobacco: Never Used  Substance and Sexual Activity  . Alcohol use: No    Alcohol/week: 0.0 standard drinks  . Drug use: No  . Sexual activity: Not on file  Lifestyle  . Physical activity    Days per week: Not on file    Minutes per session: Not on file  . Stress: Not on file  Relationships  . Social Herbalist on  phone: Not on file    Gets together: Not on file    Attends religious service: Not on file    Active member of club or organization: Not on file    Attends meetings of clubs or organizations: Not on file    Relationship status: Not on file  Other Topics Concern  . Not on file  Social History Narrative   Widow, 2 sons.   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   Relocated from [place], [place].   Tob: 10 pack-yr hx, quit age 24.   Alcohol: none.   Lives with private caregivers: 24/7 care.    Current Outpatient Medications:  .  albuterol (PROVENTIL) (2.5 MG/3ML) 0.083% nebulizer solution, Take 3 mLs (2.5 mg total) by nebulization every 4 (four) hours as needed for wheezing or shortness of breath., Disp: 75 mL, Rfl: 5 .  amLODipine (NORVASC) 10 MG tablet, 1/2 tab po qd, Disp: 30 tablet, Rfl: 0 .  amLODipine (NORVASC) 10 MG tablet, Take 0.5 tablets (5 mg total) by mouth daily., Disp: 15 tablet, Rfl: 5 .  aspirin 81 MG tablet, Take 81 mg by mouth daily.  , Disp: , Rfl:  .  atorvastatin (LIPITOR) 10 MG tablet, Take 1 tablet (10 mg total) by mouth daily., Disp: 90 tablet, Rfl: 1 .  benazepril (LOTENSIN) 40 MG tablet, Take 1 tablet (40 mg total) by mouth daily., Disp: 90 tablet, Rfl: 1 .  Calcium Carbonate (CALTRATE 600) 1500 MG TABS, Take by mouth 2 (two) times daily.  , Disp: , Rfl:  .  carbidopa-levodopa (SINEMET IR) 25-100 MG tablet, TAKE 2 TABLETS IN THE MORNING, 1 TABLET  IN  THE  AFTERNOON AND 1 TABLET IN THE EVENING, Disp: 360 tablet, Rfl: 1 .  clonazePAM (KLONOPIN) 0.5 MG tablet, TAKE 1/2 TO 1 TABLET BY MOUTH TWICE A DAY AS NEEDED, Disp: 60 tablet, Rfl: 0 .  Multiple Vitamin (MULTIVITAMIN) tablet, Take 1 tablet by mouth daily.  , Disp: , Rfl:  .  Omega-3 Fatty Acids (FISH OIL) 1200 MG CAPS, Take 1 capsule by mouth daily., Disp: , Rfl:  .  pantoprazole (PROTONIX) 40 MG tablet, Take 1 tablet (40 mg total) by mouth daily., Disp: 30 tablet, Rfl: 3  EXAM:  VITALS per patient if  applicable: BP (!) 413/244 (BP Location: Left Arm, Patient Position: Sitting, Cuff Size: Normal)   Pulse 70    GENERAL: alert, oriented, in no acute distress  LABS: none today    Chemistry      Component Value Date/Time   NA 139 05/22/2018 1113   K 3.7 05/22/2018 1113   CL 104 05/22/2018 1113   CO2 29 05/22/2018 1113   BUN 20 05/22/2018 1113   CREATININE 0.94 05/22/2018 1113  CREATININE 1.10 (H) 06/13/2015 1404      Component Value Date/Time   CALCIUM 9.6 05/22/2018 1113   ALKPHOS 74 05/22/2018 1113   AST 26 05/22/2018 1113   ALT 7 05/22/2018 1113   BILITOT 0.8 05/22/2018 1113     Lab Results  Component Value Date   CHOL 135 03/28/2017   HDL 63.10 03/28/2017   LDLCALC 46 03/28/2017   TRIG 129.0 03/28/2017   CHOLHDL 2 03/28/2017   Lab Results  Component Value Date   TSH 1.95 03/28/2017   Lab Results  Component Value Date   WBC 10.9 (H) 05/22/2018   HGB 12.8 05/22/2018   HCT 38.8 05/22/2018   MCV 89.6 05/22/2018   PLT 156 05/22/2018    ASSESSMENT AND PLAN:  Discussed the following assessment and plan:  1) Progressive supranuclear palsy: gait impairment secondary to this PLUS lingering deficit from having myositis in R thigh about 6 mo ago.  Has severe gait instability, chronic debilitation.  We'll arrange [facility]. She does not want [facility] nursing, just wants to continue with her current private caregivers and start [facility].  2) HTN: The current medical regimen is effective;  continue present plan and medications. No labs today-->VERY DIFFICULT for pt to get out of the house.  Changed amlodipine to 5mg  tab instead of splitting 10 mg tab in half.  3) HLD: tolerating statin.  4) Anxiety: stable.  Continue 1/2-1 clonaz 0.5mg  tab bid prn.  Spent 25 min with pt today, with >50% of this time spent in counseling and care coordination regarding the above problems.  I discussed the assessment and treatment plan with the patient. The patient was provided an opportunity to  ask questions and all were answered. The patient agreed with the plan and demonstrated an understanding of the instructions.   The patient was advised to call back or seek an in-person evaluation if the symptoms worsen or if the condition fails to improve as anticipated.  F/u: 3 mo RCI  Signed:  Crissie Sickles, MD           11/11/2018

## 2018-11-12 NOTE — Addendum Note (Signed)
Addended by: Deveron Furlong D on: 11/12/2018 12:14 PM   Modules accepted: Orders

## 2018-11-12 NOTE — Progress Notes (Signed)
Per verbal order from PCP, [facility] orders placed.

## 2018-11-14 DIAGNOSIS — G231 Progressive supranuclear ophthalmoplegia [Steele-Richardson-Olszewski]: Secondary | ICD-10-CM | POA: Diagnosis not present

## 2018-11-14 DIAGNOSIS — M609 Myositis, unspecified: Secondary | ICD-10-CM | POA: Diagnosis not present

## 2018-11-14 DIAGNOSIS — J309 Allergic rhinitis, unspecified: Secondary | ICD-10-CM | POA: Diagnosis not present

## 2018-11-14 DIAGNOSIS — G20 Parkinson's disease: Secondary | ICD-10-CM | POA: Diagnosis not present

## 2018-11-14 DIAGNOSIS — I350 Nonrheumatic aortic (valve) stenosis: Secondary | ICD-10-CM | POA: Diagnosis not present

## 2018-11-14 DIAGNOSIS — E785 Hyperlipidemia, unspecified: Secondary | ICD-10-CM | POA: Diagnosis not present

## 2018-11-14 DIAGNOSIS — M810 Age-related osteoporosis without current pathological fracture: Secondary | ICD-10-CM | POA: Diagnosis not present

## 2018-11-14 DIAGNOSIS — I10 Essential (primary) hypertension: Secondary | ICD-10-CM | POA: Diagnosis not present

## 2018-11-14 DIAGNOSIS — F419 Anxiety disorder, unspecified: Secondary | ICD-10-CM | POA: Diagnosis not present

## 2018-11-17 ENCOUNTER — Telehealth: Payer: Self-pay | Admitting: Family Medicine

## 2018-11-17 NOTE — Telephone Encounter (Signed)
Home Health Verbal Orders - Caller/Agency: [facility]/ [facility] Callback Number: [phone] secure  Requesting OT/PT/Skilled Nursing/Social Work/Speech Therapy: PT Frequency: 1x for 1wk, 2x for 5wks, 1x for 3wks  Speech therapist evaluation. Please advise.

## 2018-11-18 ENCOUNTER — Telehealth: Payer: Self-pay

## 2018-11-18 NOTE — Telephone Encounter (Signed)
LM for [facility] regarding verbal orders for PT and speech therapy evaluation

## 2018-11-18 NOTE — Telephone Encounter (Signed)
Received voicemail from [facility], [facility] requesting home health PT 1wk1, 2wk5, and 1wk3. They would also like to request verbal order for speech therapist. CB# [phone]  Please advise, thanks.

## 2018-11-18 NOTE — Telephone Encounter (Signed)
Yes, all ok.

## 2018-11-18 NOTE — Telephone Encounter (Signed)
LM for [facility] regarding verbal orders for PT and speech therapy evaluation.

## 2018-11-19 ENCOUNTER — Encounter: Payer: Self-pay | Admitting: Family Medicine

## 2018-11-21 DIAGNOSIS — I350 Nonrheumatic aortic (valve) stenosis: Secondary | ICD-10-CM | POA: Diagnosis not present

## 2018-11-21 DIAGNOSIS — E785 Hyperlipidemia, unspecified: Secondary | ICD-10-CM | POA: Diagnosis not present

## 2018-11-21 DIAGNOSIS — I10 Essential (primary) hypertension: Secondary | ICD-10-CM | POA: Diagnosis not present

## 2018-11-21 DIAGNOSIS — F419 Anxiety disorder, unspecified: Secondary | ICD-10-CM | POA: Diagnosis not present

## 2018-11-21 DIAGNOSIS — M609 Myositis, unspecified: Secondary | ICD-10-CM | POA: Diagnosis not present

## 2018-11-21 DIAGNOSIS — J309 Allergic rhinitis, unspecified: Secondary | ICD-10-CM | POA: Diagnosis not present

## 2018-11-21 DIAGNOSIS — G20 Parkinson's disease: Secondary | ICD-10-CM | POA: Diagnosis not present

## 2018-11-21 DIAGNOSIS — G231 Progressive supranuclear ophthalmoplegia [Steele-Richardson-Olszewski]: Secondary | ICD-10-CM | POA: Diagnosis not present

## 2018-11-21 DIAGNOSIS — M810 Age-related osteoporosis without current pathological fracture: Secondary | ICD-10-CM | POA: Diagnosis not present

## 2018-11-22 DIAGNOSIS — I350 Nonrheumatic aortic (valve) stenosis: Secondary | ICD-10-CM | POA: Diagnosis not present

## 2018-11-22 DIAGNOSIS — M609 Myositis, unspecified: Secondary | ICD-10-CM | POA: Diagnosis not present

## 2018-11-22 DIAGNOSIS — E785 Hyperlipidemia, unspecified: Secondary | ICD-10-CM | POA: Diagnosis not present

## 2018-11-22 DIAGNOSIS — G231 Progressive supranuclear ophthalmoplegia [Steele-Richardson-Olszewski]: Secondary | ICD-10-CM | POA: Diagnosis not present

## 2018-11-22 DIAGNOSIS — M810 Age-related osteoporosis without current pathological fracture: Secondary | ICD-10-CM | POA: Diagnosis not present

## 2018-11-22 DIAGNOSIS — F419 Anxiety disorder, unspecified: Secondary | ICD-10-CM | POA: Diagnosis not present

## 2018-11-22 DIAGNOSIS — G20 Parkinson's disease: Secondary | ICD-10-CM | POA: Diagnosis not present

## 2018-11-22 DIAGNOSIS — J309 Allergic rhinitis, unspecified: Secondary | ICD-10-CM | POA: Diagnosis not present

## 2018-11-22 DIAGNOSIS — I10 Essential (primary) hypertension: Secondary | ICD-10-CM | POA: Diagnosis not present

## 2018-11-24 DIAGNOSIS — G231 Progressive supranuclear ophthalmoplegia [Steele-Richardson-Olszewski]: Secondary | ICD-10-CM | POA: Diagnosis not present

## 2018-11-24 DIAGNOSIS — J309 Allergic rhinitis, unspecified: Secondary | ICD-10-CM | POA: Diagnosis not present

## 2018-11-24 DIAGNOSIS — G20 Parkinson's disease: Secondary | ICD-10-CM | POA: Diagnosis not present

## 2018-11-24 DIAGNOSIS — I350 Nonrheumatic aortic (valve) stenosis: Secondary | ICD-10-CM | POA: Diagnosis not present

## 2018-11-24 DIAGNOSIS — M810 Age-related osteoporosis without current pathological fracture: Secondary | ICD-10-CM | POA: Diagnosis not present

## 2018-11-24 DIAGNOSIS — E785 Hyperlipidemia, unspecified: Secondary | ICD-10-CM | POA: Diagnosis not present

## 2018-11-24 DIAGNOSIS — F419 Anxiety disorder, unspecified: Secondary | ICD-10-CM | POA: Diagnosis not present

## 2018-11-24 DIAGNOSIS — M609 Myositis, unspecified: Secondary | ICD-10-CM | POA: Diagnosis not present

## 2018-11-24 DIAGNOSIS — I10 Essential (primary) hypertension: Secondary | ICD-10-CM | POA: Diagnosis not present

## 2018-11-25 ENCOUNTER — Telehealth: Payer: Self-pay

## 2018-11-25 NOTE — Telephone Encounter (Signed)
Received home health order requests for [facility]ST to evaluate and treat for dysarthria and dysphagia. Separate form for home health certification. PCP will review and sign, if appropriate.

## 2018-11-26 ENCOUNTER — Telehealth: Payer: Self-pay

## 2018-11-26 DIAGNOSIS — E785 Hyperlipidemia, unspecified: Secondary | ICD-10-CM | POA: Diagnosis not present

## 2018-11-26 DIAGNOSIS — I350 Nonrheumatic aortic (valve) stenosis: Secondary | ICD-10-CM | POA: Diagnosis not present

## 2018-11-26 DIAGNOSIS — G231 Progressive supranuclear ophthalmoplegia [Steele-Richardson-Olszewski]: Secondary | ICD-10-CM | POA: Diagnosis not present

## 2018-11-26 DIAGNOSIS — M810 Age-related osteoporosis without current pathological fracture: Secondary | ICD-10-CM | POA: Diagnosis not present

## 2018-11-26 DIAGNOSIS — H538 Other visual disturbances: Secondary | ICD-10-CM

## 2018-11-26 DIAGNOSIS — G20 Parkinson's disease: Secondary | ICD-10-CM | POA: Diagnosis not present

## 2018-11-26 DIAGNOSIS — M8580 Other specified disorders of bone density and structure, unspecified site: Secondary | ICD-10-CM

## 2018-11-26 DIAGNOSIS — Z86010 Personal history of colonic polyps: Secondary | ICD-10-CM

## 2018-11-26 DIAGNOSIS — I10 Essential (primary) hypertension: Secondary | ICD-10-CM | POA: Diagnosis not present

## 2018-11-26 DIAGNOSIS — J309 Allergic rhinitis, unspecified: Secondary | ICD-10-CM | POA: Diagnosis not present

## 2018-11-26 DIAGNOSIS — M609 Myositis, unspecified: Secondary | ICD-10-CM | POA: Diagnosis not present

## 2018-11-26 DIAGNOSIS — F419 Anxiety disorder, unspecified: Secondary | ICD-10-CM | POA: Diagnosis not present

## 2018-11-26 DIAGNOSIS — Z87891 Personal history of nicotine dependence: Secondary | ICD-10-CM

## 2018-11-26 NOTE — Telephone Encounter (Signed)
I'll order DEXA for breast center/[place] [facility].  I don't have any history of fracture in her chart.  Pls see if she or a family member can give any information about this for me.

## 2018-11-26 NOTE — Telephone Encounter (Signed)
Received fax from [facility] regarding additional testing for bone density due to past fracture. PCP will review and sign, if appropriate.

## 2018-11-26 NOTE — Addendum Note (Signed)
Addended by: Tammi Sou on: 11/26/2018 04:32 PM   Modules accepted: Orders

## 2018-11-27 ENCOUNTER — Other Ambulatory Visit: Payer: Self-pay | Admitting: Family Medicine

## 2018-11-27 NOTE — Telephone Encounter (Signed)
Tried contacting patient, no answer and unable to leave message. Will try again later

## 2018-11-28 ENCOUNTER — Telehealth: Payer: Self-pay

## 2018-11-28 DIAGNOSIS — J398 Other specified diseases of upper respiratory tract: Secondary | ICD-10-CM | POA: Diagnosis not present

## 2018-11-28 DIAGNOSIS — M810 Age-related osteoporosis without current pathological fracture: Secondary | ICD-10-CM | POA: Diagnosis not present

## 2018-11-28 DIAGNOSIS — G20 Parkinson's disease: Secondary | ICD-10-CM | POA: Diagnosis not present

## 2018-11-28 DIAGNOSIS — I350 Nonrheumatic aortic (valve) stenosis: Secondary | ICD-10-CM | POA: Diagnosis not present

## 2018-11-28 DIAGNOSIS — G231 Progressive supranuclear ophthalmoplegia [Steele-Richardson-Olszewski]: Secondary | ICD-10-CM | POA: Diagnosis not present

## 2018-11-28 DIAGNOSIS — E785 Hyperlipidemia, unspecified: Secondary | ICD-10-CM | POA: Diagnosis not present

## 2018-11-28 DIAGNOSIS — M609 Myositis, unspecified: Secondary | ICD-10-CM | POA: Diagnosis not present

## 2018-11-28 DIAGNOSIS — R269 Unspecified abnormalities of gait and mobility: Secondary | ICD-10-CM | POA: Diagnosis not present

## 2018-11-28 DIAGNOSIS — J309 Allergic rhinitis, unspecified: Secondary | ICD-10-CM | POA: Diagnosis not present

## 2018-11-28 DIAGNOSIS — J698 Pneumonitis due to inhalation of other solids and liquids: Secondary | ICD-10-CM | POA: Diagnosis not present

## 2018-11-28 DIAGNOSIS — F419 Anxiety disorder, unspecified: Secondary | ICD-10-CM | POA: Diagnosis not present

## 2018-11-28 DIAGNOSIS — I10 Essential (primary) hypertension: Secondary | ICD-10-CM | POA: Diagnosis not present

## 2018-11-28 NOTE — Telephone Encounter (Signed)
Left detailed message on secure voicemail for Anna Genre giving verbal okay per PCP for requested orders and frequency.

## 2018-11-28 NOTE — Telephone Encounter (Signed)
This is fine 

## 2018-11-28 NOTE — Telephone Encounter (Signed)
Requesting verbal orders for speech therapy, once a week for 4 weeks to treat dysphagia. CB # is [phone], voicemail is secure.  Okay for order?

## 2018-11-28 NOTE — Telephone Encounter (Signed)
RF request for Clonazepam LOV: 11/11/18 Next ov: advised to f/u 3 mo. Last written: 08/08/18 (60,0) No CSC or UDS  Medication pending, please advise, thanks.

## 2018-11-29 DIAGNOSIS — J986 Disorders of diaphragm: Secondary | ICD-10-CM | POA: Diagnosis not present

## 2018-11-29 DIAGNOSIS — G231 Progressive supranuclear ophthalmoplegia [Steele-Richardson-Olszewski]: Secondary | ICD-10-CM | POA: Diagnosis not present

## 2018-11-29 DIAGNOSIS — R4702 Dysphasia: Secondary | ICD-10-CM | POA: Diagnosis not present

## 2018-12-02 DIAGNOSIS — I10 Essential (primary) hypertension: Secondary | ICD-10-CM | POA: Diagnosis not present

## 2018-12-02 DIAGNOSIS — M609 Myositis, unspecified: Secondary | ICD-10-CM | POA: Diagnosis not present

## 2018-12-02 DIAGNOSIS — M810 Age-related osteoporosis without current pathological fracture: Secondary | ICD-10-CM | POA: Diagnosis not present

## 2018-12-02 DIAGNOSIS — I350 Nonrheumatic aortic (valve) stenosis: Secondary | ICD-10-CM | POA: Diagnosis not present

## 2018-12-02 DIAGNOSIS — E785 Hyperlipidemia, unspecified: Secondary | ICD-10-CM | POA: Diagnosis not present

## 2018-12-02 DIAGNOSIS — G231 Progressive supranuclear ophthalmoplegia [Steele-Richardson-Olszewski]: Secondary | ICD-10-CM | POA: Diagnosis not present

## 2018-12-02 DIAGNOSIS — J309 Allergic rhinitis, unspecified: Secondary | ICD-10-CM | POA: Diagnosis not present

## 2018-12-02 DIAGNOSIS — F419 Anxiety disorder, unspecified: Secondary | ICD-10-CM | POA: Diagnosis not present

## 2018-12-02 DIAGNOSIS — G20 Parkinson's disease: Secondary | ICD-10-CM | POA: Diagnosis not present

## 2018-12-03 NOTE — Telephone Encounter (Signed)
LM for pt's daughter in law to call back to see if she can give any info.

## 2018-12-04 ENCOUNTER — Telehealth: Payer: Self-pay | Admitting: Family Medicine

## 2018-12-04 NOTE — Telephone Encounter (Signed)
Patient's caregiver states patient has a strong odor to her urine and is having a lot of genital itching. Caregiver feels patient is a little disoriented as well. She feels patient has a UTI. She does not want to bring her in to the office due to [place] but would like to get a specimen cup. PCP not available. Please advise.

## 2018-12-04 NOTE — Telephone Encounter (Signed)
Called patient and spoke to Tammy her caregiver. Caregiver said that patient does not have any fever, discharge, or blood in urine or underwear. Just the odor and itching. Patient is okay with seeing a different provider VV. Would you be able to schedule with someone? She understands that she will have to bring a urine in.

## 2018-12-04 NOTE — Telephone Encounter (Signed)
Patient's caregiver said she cannot take patient in for a urine culture anywhere without the families permission. She declined a virtual visit with any [facility]. She declined an appointment.

## 2018-12-05 ENCOUNTER — Ambulatory Visit: Payer: Medicare HMO | Admitting: Neurology

## 2018-12-08 DIAGNOSIS — J309 Allergic rhinitis, unspecified: Secondary | ICD-10-CM | POA: Diagnosis not present

## 2018-12-08 DIAGNOSIS — I350 Nonrheumatic aortic (valve) stenosis: Secondary | ICD-10-CM | POA: Diagnosis not present

## 2018-12-08 DIAGNOSIS — F419 Anxiety disorder, unspecified: Secondary | ICD-10-CM | POA: Diagnosis not present

## 2018-12-08 DIAGNOSIS — M810 Age-related osteoporosis without current pathological fracture: Secondary | ICD-10-CM | POA: Diagnosis not present

## 2018-12-08 DIAGNOSIS — I10 Essential (primary) hypertension: Secondary | ICD-10-CM | POA: Diagnosis not present

## 2018-12-08 DIAGNOSIS — G20 Parkinson's disease: Secondary | ICD-10-CM | POA: Diagnosis not present

## 2018-12-08 DIAGNOSIS — E785 Hyperlipidemia, unspecified: Secondary | ICD-10-CM | POA: Diagnosis not present

## 2018-12-08 DIAGNOSIS — G231 Progressive supranuclear ophthalmoplegia [Steele-Richardson-Olszewski]: Secondary | ICD-10-CM | POA: Diagnosis not present

## 2018-12-08 DIAGNOSIS — M609 Myositis, unspecified: Secondary | ICD-10-CM | POA: Diagnosis not present

## 2018-12-09 ENCOUNTER — Telehealth: Payer: Self-pay

## 2018-12-09 DIAGNOSIS — F419 Anxiety disorder, unspecified: Secondary | ICD-10-CM | POA: Diagnosis not present

## 2018-12-09 DIAGNOSIS — I10 Essential (primary) hypertension: Secondary | ICD-10-CM | POA: Diagnosis not present

## 2018-12-09 DIAGNOSIS — M609 Myositis, unspecified: Secondary | ICD-10-CM | POA: Diagnosis not present

## 2018-12-09 DIAGNOSIS — G20 Parkinson's disease: Secondary | ICD-10-CM | POA: Diagnosis not present

## 2018-12-09 DIAGNOSIS — M810 Age-related osteoporosis without current pathological fracture: Secondary | ICD-10-CM | POA: Diagnosis not present

## 2018-12-09 DIAGNOSIS — I350 Nonrheumatic aortic (valve) stenosis: Secondary | ICD-10-CM | POA: Diagnosis not present

## 2018-12-09 DIAGNOSIS — J309 Allergic rhinitis, unspecified: Secondary | ICD-10-CM | POA: Diagnosis not present

## 2018-12-09 DIAGNOSIS — G231 Progressive supranuclear ophthalmoplegia [Steele-Richardson-Olszewski]: Secondary | ICD-10-CM | POA: Diagnosis not present

## 2018-12-09 DIAGNOSIS — E785 Hyperlipidemia, unspecified: Secondary | ICD-10-CM | POA: Diagnosis not present

## 2018-12-09 NOTE — Telephone Encounter (Signed)
Received HH order for speech therapy evaluation.   PCP will review and sign, if appropriate.

## 2018-12-12 DIAGNOSIS — G231 Progressive supranuclear ophthalmoplegia [Steele-Richardson-Olszewski]: Secondary | ICD-10-CM | POA: Diagnosis not present

## 2018-12-12 DIAGNOSIS — J309 Allergic rhinitis, unspecified: Secondary | ICD-10-CM | POA: Diagnosis not present

## 2018-12-12 DIAGNOSIS — M609 Myositis, unspecified: Secondary | ICD-10-CM | POA: Diagnosis not present

## 2018-12-12 DIAGNOSIS — M810 Age-related osteoporosis without current pathological fracture: Secondary | ICD-10-CM | POA: Diagnosis not present

## 2018-12-12 DIAGNOSIS — E785 Hyperlipidemia, unspecified: Secondary | ICD-10-CM | POA: Diagnosis not present

## 2018-12-12 DIAGNOSIS — F419 Anxiety disorder, unspecified: Secondary | ICD-10-CM | POA: Diagnosis not present

## 2018-12-12 DIAGNOSIS — I10 Essential (primary) hypertension: Secondary | ICD-10-CM | POA: Diagnosis not present

## 2018-12-12 DIAGNOSIS — I350 Nonrheumatic aortic (valve) stenosis: Secondary | ICD-10-CM | POA: Diagnosis not present

## 2018-12-12 DIAGNOSIS — G20 Parkinson's disease: Secondary | ICD-10-CM | POA: Diagnosis not present

## 2018-12-12 NOTE — Telephone Encounter (Signed)
Pts caregiver was called and she said family was on vacation and she would provide number to family to call Dr Isla Pence office when they return.

## 2018-12-15 DIAGNOSIS — E785 Hyperlipidemia, unspecified: Secondary | ICD-10-CM | POA: Diagnosis not present

## 2018-12-15 DIAGNOSIS — F419 Anxiety disorder, unspecified: Secondary | ICD-10-CM | POA: Diagnosis not present

## 2018-12-15 DIAGNOSIS — J309 Allergic rhinitis, unspecified: Secondary | ICD-10-CM | POA: Diagnosis not present

## 2018-12-15 DIAGNOSIS — M609 Myositis, unspecified: Secondary | ICD-10-CM | POA: Diagnosis not present

## 2018-12-15 DIAGNOSIS — G20 Parkinson's disease: Secondary | ICD-10-CM | POA: Diagnosis not present

## 2018-12-15 DIAGNOSIS — I350 Nonrheumatic aortic (valve) stenosis: Secondary | ICD-10-CM | POA: Diagnosis not present

## 2018-12-15 DIAGNOSIS — I10 Essential (primary) hypertension: Secondary | ICD-10-CM | POA: Diagnosis not present

## 2018-12-15 DIAGNOSIS — M810 Age-related osteoporosis without current pathological fracture: Secondary | ICD-10-CM | POA: Diagnosis not present

## 2018-12-15 DIAGNOSIS — G231 Progressive supranuclear ophthalmoplegia [Steele-Richardson-Olszewski]: Secondary | ICD-10-CM | POA: Diagnosis not present

## 2018-12-17 NOTE — Telephone Encounter (Signed)
Done

## 2018-12-18 ENCOUNTER — Other Ambulatory Visit: Payer: Self-pay | Admitting: Family Medicine

## 2018-12-18 NOTE — Telephone Encounter (Signed)
PCP signed on 11/26/18.

## 2018-12-19 DIAGNOSIS — M810 Age-related osteoporosis without current pathological fracture: Secondary | ICD-10-CM | POA: Diagnosis not present

## 2018-12-19 DIAGNOSIS — F419 Anxiety disorder, unspecified: Secondary | ICD-10-CM | POA: Diagnosis not present

## 2018-12-19 DIAGNOSIS — G231 Progressive supranuclear ophthalmoplegia [Steele-Richardson-Olszewski]: Secondary | ICD-10-CM | POA: Diagnosis not present

## 2018-12-19 DIAGNOSIS — M609 Myositis, unspecified: Secondary | ICD-10-CM | POA: Diagnosis not present

## 2018-12-19 DIAGNOSIS — G20 Parkinson's disease: Secondary | ICD-10-CM | POA: Diagnosis not present

## 2018-12-19 DIAGNOSIS — J309 Allergic rhinitis, unspecified: Secondary | ICD-10-CM | POA: Diagnosis not present

## 2018-12-19 DIAGNOSIS — I10 Essential (primary) hypertension: Secondary | ICD-10-CM | POA: Diagnosis not present

## 2018-12-19 DIAGNOSIS — I350 Nonrheumatic aortic (valve) stenosis: Secondary | ICD-10-CM | POA: Diagnosis not present

## 2018-12-19 DIAGNOSIS — E785 Hyperlipidemia, unspecified: Secondary | ICD-10-CM | POA: Diagnosis not present

## 2018-12-22 ENCOUNTER — Telehealth: Payer: Self-pay | Admitting: Family Medicine

## 2018-12-22 NOTE — Telephone Encounter (Signed)
Refill request  benazepril (LOTENSIN) 40 MG tablet [id number]   [facility]

## 2018-12-23 ENCOUNTER — Other Ambulatory Visit: Payer: Self-pay

## 2018-12-23 DIAGNOSIS — J309 Allergic rhinitis, unspecified: Secondary | ICD-10-CM | POA: Diagnosis not present

## 2018-12-23 DIAGNOSIS — M609 Myositis, unspecified: Secondary | ICD-10-CM | POA: Diagnosis not present

## 2018-12-23 DIAGNOSIS — G231 Progressive supranuclear ophthalmoplegia [Steele-Richardson-Olszewski]: Secondary | ICD-10-CM | POA: Diagnosis not present

## 2018-12-23 DIAGNOSIS — I10 Essential (primary) hypertension: Secondary | ICD-10-CM | POA: Diagnosis not present

## 2018-12-23 DIAGNOSIS — I350 Nonrheumatic aortic (valve) stenosis: Secondary | ICD-10-CM | POA: Diagnosis not present

## 2018-12-23 DIAGNOSIS — F419 Anxiety disorder, unspecified: Secondary | ICD-10-CM | POA: Diagnosis not present

## 2018-12-23 DIAGNOSIS — E785 Hyperlipidemia, unspecified: Secondary | ICD-10-CM | POA: Diagnosis not present

## 2018-12-23 DIAGNOSIS — M810 Age-related osteoporosis without current pathological fracture: Secondary | ICD-10-CM | POA: Diagnosis not present

## 2018-12-23 DIAGNOSIS — G20 Parkinson's disease: Secondary | ICD-10-CM | POA: Diagnosis not present

## 2018-12-23 MED ORDER — BENAZEPRIL HCL 40 MG PO TABS: 40.0000 mg | ORAL_TABLET | Freq: Every day | ORAL | 0 refills | Status: DC

## 2018-12-23 NOTE — Telephone Encounter (Signed)
Refill sent in

## 2018-12-24 DIAGNOSIS — M609 Myositis, unspecified: Secondary | ICD-10-CM | POA: Diagnosis not present

## 2018-12-24 DIAGNOSIS — J309 Allergic rhinitis, unspecified: Secondary | ICD-10-CM | POA: Diagnosis not present

## 2018-12-24 DIAGNOSIS — G20 Parkinson's disease: Secondary | ICD-10-CM | POA: Diagnosis not present

## 2018-12-24 DIAGNOSIS — I350 Nonrheumatic aortic (valve) stenosis: Secondary | ICD-10-CM | POA: Diagnosis not present

## 2018-12-24 DIAGNOSIS — I10 Essential (primary) hypertension: Secondary | ICD-10-CM | POA: Diagnosis not present

## 2018-12-24 DIAGNOSIS — G231 Progressive supranuclear ophthalmoplegia [Steele-Richardson-Olszewski]: Secondary | ICD-10-CM | POA: Diagnosis not present

## 2018-12-24 DIAGNOSIS — F419 Anxiety disorder, unspecified: Secondary | ICD-10-CM | POA: Diagnosis not present

## 2018-12-24 DIAGNOSIS — E785 Hyperlipidemia, unspecified: Secondary | ICD-10-CM | POA: Diagnosis not present

## 2018-12-24 DIAGNOSIS — M810 Age-related osteoporosis without current pathological fracture: Secondary | ICD-10-CM | POA: Diagnosis not present

## 2018-12-28 DIAGNOSIS — R269 Unspecified abnormalities of gait and mobility: Secondary | ICD-10-CM | POA: Diagnosis not present

## 2018-12-28 DIAGNOSIS — J698 Pneumonitis due to inhalation of other solids and liquids: Secondary | ICD-10-CM | POA: Diagnosis not present

## 2018-12-28 DIAGNOSIS — G231 Progressive supranuclear ophthalmoplegia [Steele-Richardson-Olszewski]: Secondary | ICD-10-CM | POA: Diagnosis not present

## 2018-12-28 DIAGNOSIS — J398 Other specified diseases of upper respiratory tract: Secondary | ICD-10-CM | POA: Diagnosis not present

## 2018-12-29 DIAGNOSIS — G231 Progressive supranuclear ophthalmoplegia [Steele-Richardson-Olszewski]: Secondary | ICD-10-CM | POA: Diagnosis not present

## 2018-12-29 DIAGNOSIS — R4702 Dysphasia: Secondary | ICD-10-CM | POA: Diagnosis not present

## 2018-12-29 DIAGNOSIS — J986 Disorders of diaphragm: Secondary | ICD-10-CM | POA: Diagnosis not present

## 2018-12-30 DIAGNOSIS — G20 Parkinson's disease: Secondary | ICD-10-CM | POA: Diagnosis not present

## 2018-12-30 DIAGNOSIS — E785 Hyperlipidemia, unspecified: Secondary | ICD-10-CM | POA: Diagnosis not present

## 2018-12-30 DIAGNOSIS — M810 Age-related osteoporosis without current pathological fracture: Secondary | ICD-10-CM | POA: Diagnosis not present

## 2018-12-30 DIAGNOSIS — F419 Anxiety disorder, unspecified: Secondary | ICD-10-CM | POA: Diagnosis not present

## 2018-12-30 DIAGNOSIS — I10 Essential (primary) hypertension: Secondary | ICD-10-CM | POA: Diagnosis not present

## 2018-12-30 DIAGNOSIS — I350 Nonrheumatic aortic (valve) stenosis: Secondary | ICD-10-CM | POA: Diagnosis not present

## 2018-12-30 DIAGNOSIS — J309 Allergic rhinitis, unspecified: Secondary | ICD-10-CM | POA: Diagnosis not present

## 2018-12-30 DIAGNOSIS — M609 Myositis, unspecified: Secondary | ICD-10-CM | POA: Diagnosis not present

## 2018-12-30 DIAGNOSIS — G231 Progressive supranuclear ophthalmoplegia [Steele-Richardson-Olszewski]: Secondary | ICD-10-CM | POA: Diagnosis not present

## 2019-01-02 ENCOUNTER — Other Ambulatory Visit: Payer: Self-pay

## 2019-01-02 ENCOUNTER — Ambulatory Visit (INDEPENDENT_AMBULATORY_CARE_PROVIDER_SITE_OTHER): Payer: Medicare HMO | Admitting: Neurology

## 2019-01-02 DIAGNOSIS — K117 Disturbances of salivary secretion: Secondary | ICD-10-CM

## 2019-01-02 MED ORDER — RIMABOTULINUMTOXINB 5000 UNIT/ML IM SOLN
5000.0000 [IU] | Freq: Once | INTRAMUSCULAR | Status: AC
  Administered 2019-01-02: 5000 [IU] via INTRAMUSCULAR

## 2019-01-02 NOTE — Procedures (Signed)
[facility]    History:  Diagnosis: Sialorrhea    Result History       Injections  Location Left  Right Units Number of sites  Submandibular gland 250 250 500 1 per side  Parotid [id number] 1 per side  TOTAL UNITS:     5000      Type of Toxin: Myobloc type B As ordered and injected IM at today's visit Total Units: 5000  Discarded Units: 0  Needle drawback with each injection was free of blood. Pt tolerated procedure well without complications.   Reinjection is anticipated in 3 months.  Clinical comment:  pts son/caregiver asked to make a regular f/u for the patient to discuss issues that they are having.  Asked this last visit as well but declined but came today with list of issues.  Offered video or phone visit since difficult to get pt to appt.

## 2019-01-06 DIAGNOSIS — H26491 Other secondary cataract, right eye: Secondary | ICD-10-CM | POA: Diagnosis not present

## 2019-01-06 DIAGNOSIS — Z961 Presence of intraocular lens: Secondary | ICD-10-CM | POA: Diagnosis not present

## 2019-01-06 DIAGNOSIS — H353131 Nonexudative age-related macular degeneration, bilateral, early dry stage: Secondary | ICD-10-CM | POA: Diagnosis not present

## 2019-01-06 DIAGNOSIS — H04123 Dry eye syndrome of bilateral lacrimal glands: Secondary | ICD-10-CM | POA: Diagnosis not present

## 2019-01-07 ENCOUNTER — Telehealth: Payer: Self-pay | Admitting: Family Medicine

## 2019-01-07 DIAGNOSIS — J309 Allergic rhinitis, unspecified: Secondary | ICD-10-CM | POA: Diagnosis not present

## 2019-01-07 DIAGNOSIS — M609 Myositis, unspecified: Secondary | ICD-10-CM | POA: Diagnosis not present

## 2019-01-07 DIAGNOSIS — F419 Anxiety disorder, unspecified: Secondary | ICD-10-CM | POA: Diagnosis not present

## 2019-01-07 DIAGNOSIS — I350 Nonrheumatic aortic (valve) stenosis: Secondary | ICD-10-CM | POA: Diagnosis not present

## 2019-01-07 DIAGNOSIS — E785 Hyperlipidemia, unspecified: Secondary | ICD-10-CM | POA: Diagnosis not present

## 2019-01-07 DIAGNOSIS — I10 Essential (primary) hypertension: Secondary | ICD-10-CM | POA: Diagnosis not present

## 2019-01-07 DIAGNOSIS — G20 Parkinson's disease: Secondary | ICD-10-CM | POA: Diagnosis not present

## 2019-01-07 DIAGNOSIS — M810 Age-related osteoporosis without current pathological fracture: Secondary | ICD-10-CM | POA: Diagnosis not present

## 2019-01-07 DIAGNOSIS — G231 Progressive supranuclear ophthalmoplegia [Steele-Richardson-Olszewski]: Secondary | ICD-10-CM | POA: Diagnosis not present

## 2019-01-07 NOTE — Telephone Encounter (Signed)
Yes ok 

## 2019-01-07 NOTE — Telephone Encounter (Signed)
Sharyn Lull calling from [facility] for an ext on PT orders for pt [phone].  One visit every other wk to f/u on caregiver training for two months.

## 2019-01-07 NOTE — Telephone Encounter (Signed)
Okay for extension on PT orders? Pt's last visit 11/11/18

## 2019-01-08 NOTE — Telephone Encounter (Signed)
Left secure message for Sharyn Lull, advising okay for extension per PCP.

## 2019-01-09 ENCOUNTER — Encounter: Payer: Self-pay | Admitting: Family Medicine

## 2019-01-09 DIAGNOSIS — Z885 Allergy status to narcotic agent status: Secondary | ICD-10-CM | POA: Diagnosis not present

## 2019-01-09 DIAGNOSIS — R55 Syncope and collapse: Secondary | ICD-10-CM | POA: Diagnosis not present

## 2019-01-09 DIAGNOSIS — G20 Parkinson's disease: Secondary | ICD-10-CM | POA: Diagnosis not present

## 2019-01-09 DIAGNOSIS — N3000 Acute cystitis without hematuria: Secondary | ICD-10-CM | POA: Diagnosis not present

## 2019-01-09 DIAGNOSIS — Z7982 Long term (current) use of aspirin: Secondary | ICD-10-CM | POA: Diagnosis not present

## 2019-01-09 DIAGNOSIS — I10 Essential (primary) hypertension: Secondary | ICD-10-CM | POA: Diagnosis not present

## 2019-01-09 DIAGNOSIS — R4182 Altered mental status, unspecified: Secondary | ICD-10-CM | POA: Diagnosis not present

## 2019-01-09 DIAGNOSIS — R9431 Abnormal electrocardiogram [ECG] [EKG]: Secondary | ICD-10-CM | POA: Diagnosis not present

## 2019-01-09 DIAGNOSIS — Z79899 Other long term (current) drug therapy: Secondary | ICD-10-CM | POA: Diagnosis not present

## 2019-01-12 DIAGNOSIS — J309 Allergic rhinitis, unspecified: Secondary | ICD-10-CM | POA: Diagnosis not present

## 2019-01-12 DIAGNOSIS — M810 Age-related osteoporosis without current pathological fracture: Secondary | ICD-10-CM | POA: Diagnosis not present

## 2019-01-12 DIAGNOSIS — G231 Progressive supranuclear ophthalmoplegia [Steele-Richardson-Olszewski]: Secondary | ICD-10-CM | POA: Diagnosis not present

## 2019-01-12 DIAGNOSIS — F419 Anxiety disorder, unspecified: Secondary | ICD-10-CM | POA: Diagnosis not present

## 2019-01-12 DIAGNOSIS — E785 Hyperlipidemia, unspecified: Secondary | ICD-10-CM | POA: Diagnosis not present

## 2019-01-12 DIAGNOSIS — G20 Parkinson's disease: Secondary | ICD-10-CM | POA: Diagnosis not present

## 2019-01-12 DIAGNOSIS — I350 Nonrheumatic aortic (valve) stenosis: Secondary | ICD-10-CM | POA: Diagnosis not present

## 2019-01-12 DIAGNOSIS — M609 Myositis, unspecified: Secondary | ICD-10-CM | POA: Diagnosis not present

## 2019-01-12 DIAGNOSIS — I10 Essential (primary) hypertension: Secondary | ICD-10-CM | POA: Diagnosis not present

## 2019-01-13 ENCOUNTER — Other Ambulatory Visit: Payer: Self-pay

## 2019-01-13 ENCOUNTER — Other Ambulatory Visit: Payer: Self-pay | Admitting: Neurology

## 2019-01-13 ENCOUNTER — Telehealth: Payer: Self-pay

## 2019-01-13 MED ORDER — BENAZEPRIL HCL 40 MG PO TABS: 40.0000 mg | ORAL_TABLET | Freq: Every day | ORAL | 0 refills | Status: AC

## 2019-01-13 NOTE — Telephone Encounter (Signed)
Yes, ok for orders.

## 2019-01-13 NOTE — Telephone Encounter (Signed)
Received voicemail from Tiffany [name] with [facility]. She is requesting verbal orders for strength and mobility training, balance training, and pt education for 2 wk3 and 1wk4. Pt still has decreased mobility, weakness and fall risk.  Okay for orders?

## 2019-01-14 NOTE — Telephone Encounter (Signed)
Spoke with Nunzio Cory, per PCP okay for VO.

## 2019-01-14 NOTE — Progress Notes (Signed)
Pt had virtual visit today but only son showed up.  Son was at work.  Wanted to discuss with me privately goals of care, putting patient back in the hospice.  We discussed end-of-life issue.  We discussed discontinuation of Botox, so that we could carry out patient/family wishes.  Hospice will not allow continuation of Botox.  Patient's son states that they are just trying to keep her comfortable.  Will send referral to [facility] for consult

## 2019-01-16 ENCOUNTER — Telehealth (INDEPENDENT_AMBULATORY_CARE_PROVIDER_SITE_OTHER): Payer: Medicare HMO | Admitting: Neurology

## 2019-01-16 ENCOUNTER — Other Ambulatory Visit: Payer: Self-pay

## 2019-01-16 ENCOUNTER — Encounter: Payer: Self-pay | Admitting: Neurology

## 2019-01-16 VITALS — Ht 64.0 in | Wt 139.0 lb

## 2019-01-16 DIAGNOSIS — G231 Progressive supranuclear ophthalmoplegia [Steele-Richardson-Olszewski]: Secondary | ICD-10-CM

## 2019-01-18 ENCOUNTER — Encounter: Payer: Self-pay | Admitting: Family Medicine

## 2019-01-19 DIAGNOSIS — G20 Parkinson's disease: Secondary | ICD-10-CM | POA: Diagnosis not present

## 2019-01-19 DIAGNOSIS — I10 Essential (primary) hypertension: Secondary | ICD-10-CM | POA: Diagnosis not present

## 2019-01-19 DIAGNOSIS — F419 Anxiety disorder, unspecified: Secondary | ICD-10-CM | POA: Diagnosis not present

## 2019-01-19 DIAGNOSIS — G231 Progressive supranuclear ophthalmoplegia [Steele-Richardson-Olszewski]: Secondary | ICD-10-CM | POA: Diagnosis not present

## 2019-01-19 DIAGNOSIS — N390 Urinary tract infection, site not specified: Secondary | ICD-10-CM | POA: Diagnosis not present

## 2019-01-19 DIAGNOSIS — E785 Hyperlipidemia, unspecified: Secondary | ICD-10-CM | POA: Diagnosis not present

## 2019-01-19 DIAGNOSIS — I350 Nonrheumatic aortic (valve) stenosis: Secondary | ICD-10-CM | POA: Diagnosis not present

## 2019-01-19 DIAGNOSIS — M810 Age-related osteoporosis without current pathological fracture: Secondary | ICD-10-CM | POA: Diagnosis not present

## 2019-01-19 DIAGNOSIS — H538 Other visual disturbances: Secondary | ICD-10-CM | POA: Diagnosis not present

## 2019-01-20 ENCOUNTER — Telehealth: Payer: Self-pay | Admitting: Family Medicine

## 2019-01-20 NOTE — Telephone Encounter (Signed)
Patient's caregiver would like to know if he can pick up a urine specimen cup. Patient went to [facility] last week for UTI. Patient still feels sluggish so she is not sure if she is over the UTI. Patient does not want to come in to the office for an OV.

## 2019-01-20 NOTE — Telephone Encounter (Signed)
Called patient and spoke to caregiver. Patient is going to come in tomorrow for a OV. Patient has finished round of abx and still having sx. Patient is sluggish and having vaginal itching.

## 2019-01-21 ENCOUNTER — Other Ambulatory Visit: Payer: Self-pay

## 2019-01-21 ENCOUNTER — Encounter: Payer: Self-pay | Admitting: Family Medicine

## 2019-01-21 ENCOUNTER — Ambulatory Visit (INDEPENDENT_AMBULATORY_CARE_PROVIDER_SITE_OTHER): Payer: Medicare HMO | Admitting: Family Medicine

## 2019-01-21 VITALS — BP 129/74 | HR 74 | Temp 98.0°F | Resp 16

## 2019-01-21 DIAGNOSIS — R350 Frequency of micturition: Secondary | ICD-10-CM | POA: Diagnosis not present

## 2019-01-21 DIAGNOSIS — R55 Syncope and collapse: Secondary | ICD-10-CM

## 2019-01-21 DIAGNOSIS — R404 Transient alteration of awareness: Secondary | ICD-10-CM

## 2019-01-21 DIAGNOSIS — G231 Progressive supranuclear ophthalmoplegia [Steele-Richardson-Olszewski]: Secondary | ICD-10-CM | POA: Diagnosis not present

## 2019-01-21 LAB — POCT URINALYSIS DIPSTICK
Bilirubin, UA: NEGATIVE
Blood, UA: NEGATIVE
Glucose, UA: NEGATIVE
Ketones, UA: NEGATIVE
Nitrite, UA: NEGATIVE
Protein, UA: POSITIVE — AB
Spec Grav, UA: 1.015 (ref 1.010–1.025)
Urobilinogen, UA: 0.2 E.U./dL
pH, UA: 6 (ref 5.0–8.0)

## 2019-01-21 MED ORDER — SULFAMETHOXAZOLE-TRIMETHOPRIM 800-160 MG PO TABS: 1.0000 | ORAL_TABLET | Freq: Two times a day (BID) | ORAL | 0 refills | Status: DC

## 2019-01-21 NOTE — Progress Notes (Signed)
OFFICE VISIT  01/21/2019   CC:  Chief Complaint  Patient presents with  . Urinary Tract Infection    urinary frequency   HPI:    Patient is a 85 y.o. Caucasian female who presents accompanied by her caregiver for "UTI". Has been feeling sluggish, not acting like herself, talking less, decreased PO intake, in and out of full cognitive awareness and interaction.  She then had a syncopal event 01/09/19 while sitting down and getting a sponge bath, caregiver says no warning was noted.  She did not get injured.  She was not responsive for "about 2-3 min" per caregiver report today, w/out tonic clonic activity.  Was a bit confused upon arousal. Denies pain or straining prior.  Family had noted her urine being "stronger" on and off more prior to this and since this.  No urinary burning or change in frequency or urgency. No fevers, no n/v/d.  At the [facility] med center ED on 01/09/19 she got a full workup including blood, urine, EKG, and imaging.  I don't have records available for review today, but the sheets the caregiver has from that encounter does list that a urine culture was ordered.  They have not received a call regarding results or recommendations of med change.  She was given rocephin injection in ED and discharged home with rx for keflex, which she took.  BP apparently was normal and all w/u reassuring other than presumably her UA. Overall she has not really changed from a standpoint of symptoms since getting the antibiotic.  Past Medical History:  Diagnosis Date  . ALLERGIC RHINITIS 11/21/2006  . At high risk for aspiration 12/2017   Progressive oral pharyngeal dysfunction/dysphagia: pulm recommended daily vest with cough assist device + albuterol nebulized  . BACK PAIN 12/17/2006  . Chronic renal insufficiency, stage 3 (moderate)    GFR 50s  . COLONIC POLYPS, HX OF 11/21/2006  . HYPERTENSION 11/21/2006  . Mild aortic stenosis   . OSTEOPENIA 08/11/2009  . OSTEOPOROSIS  11/21/2006  . PEDAL EDEMA 07/05/2008  . Progressive supranuclear palsy (HCC)    Dr. Carles Collet, botox inj to saliv glands 01/2018 thru 12/2018->then pt/family elected for comfort care/hospice.  . Sialorrhea    botulinum inj saliv glands 2020->stopped 12/2018 due to change to comfort care/hospice  . SYSTOLIC MURMUR [id number]    Past Surgical History:  Procedure Laterality Date  . BREAST EXCISIONAL BIOPSY Right    benign  . BREAST SURGERY     bx  . CATARACT EXTRACTION Bilateral 2012  . HEMORRHOID SURGERY    . TONSILLECTOMY    . TRANSTHORACIC ECHOCARDIOGRAM  04/15/2017   EF 55-60%, mild AS (no change from 2017), grd I DD.  Repeat 2 yrs planned.    Outpatient Medications Prior to Visit  Medication Sig Dispense Refill  . albuterol (PROVENTIL) (2.5 MG/3ML) 0.083% nebulizer solution Take 3 mLs (2.5 mg total) by nebulization every 4 (four) hours as needed for wheezing or shortness of breath. 75 mL 5  . amLODipine (NORVASC) 5 MG tablet Take 1 tablet (5 mg total) by mouth daily. 90 tablet 3  . aspirin 81 MG tablet Take 81 mg by mouth daily.      Marland Kitchen atorvastatin (LIPITOR) 10 MG tablet Take 1 tablet (10 mg total) by mouth daily. 90 tablet 1  . benazepril (LOTENSIN) 40 MG tablet Take 1 tablet (40 mg total) by mouth daily. 90 tablet 0  . Calcium Carbonate (CALTRATE 600) 1500 MG TABS Take by mouth 2 (two)  times daily.      . carbidopa-levodopa (SINEMET IR) 25-100 MG tablet Take 1 tablet by mouth 3 (three) times daily. Take 1 tablet by mouth 2 times daily. AFTERNOON AND BEDTIME    . clonazePAM (KLONOPIN) 0.5 MG tablet TAKE 1/2 TO 1 TABLET BY MOUTH TWICE A DAY AS NEEDED 60 tablet 1  . Multiple Vitamin (MULTIVITAMIN) tablet Take 1 tablet by mouth daily.      . Omega-3 Fatty Acids (FISH OIL) 1200 MG CAPS Take 1 capsule by mouth daily.    . pantoprazole (PROTONIX) 40 MG tablet TAKE 1 TABLET BY MOUTH EVERY DAY 90 tablet 1   No facility-administered medications prior to visit.     Allergies  Allergen  Reactions  . Codeine Phosphate     ROS As per HPI  PE: Blood pressure 129/74, pulse 74, temperature 98 F (36.7 C), temperature source Temporal, resp. rate 16, SpO2 99 %. Gen: Alert, well appearing, answers questions with head movements and slow speech as per her usual.   [id number]: no injection, icteris, swelling, or exudate. She has good lateral and medial EOM but does not gaze upward or downward to follow my fingers.  PERRLA. Mouth: lips without lesion/swelling.  Oral mucosa pink and moist. Oropharynx without erythema, exudate, or swelling.  CV: RRR, 4/6 harsh systolic murmur heard best in AV region, no diastolic murmur.  No rub or gallop. Chest is clear, no wheezing or rales. Normal symmetric air entry throughout both lung fields. No chest wall deformities or tenderness. EXT: no clubbing or cyanosis.  no edema.  SKIN: no pallor or jaundice or rash.    LABS:    Chemistry      Component Value Date/Time   NA 139 05/22/2018 1113   K 3.7 05/22/2018 1113   CL 104 05/22/2018 1113   CO2 29 05/22/2018 1113   BUN 20 05/22/2018 1113   CREATININE 0.94 05/22/2018 1113   CREATININE 1.10 (H) 06/13/2015 1404      Component Value Date/Time   CALCIUM 9.6 05/22/2018 1113   ALKPHOS 74 05/22/2018 1113   AST 26 05/22/2018 1113   ALT 7 05/22/2018 1113   BILITOT 0.8 05/22/2018 1113     POC CC UA today: 2+ leukocytes, otherwise all normal.  IMPRESSION AND PLAN:  Insidious decline in overall function in pt with progressive supranuclear palsy, with the acute event being a syncopal episode.  It is likely she had vasovagal syncope.  Will obtain records from her ED visit. At this time, since the urine clx/sensitivity results from 01/09/19 are not available to me I'll start abx->bactrim DS 1 bid.  Sent urine from today for c/s as well. Discussed the fact that her gradual decline is consistent with her progressive neurologic disorder. This is leading to relative dehydration and also suspect she is  overall more sensitive to the potential adverse effects of her polypharmacy.   Low suspicion that urinary infection (if present) is causing her sx's. Caregiver and pt expressed understanding. I see from recent virtual visit that Dr. Carles Collet had with pt's son that they have requested she have comfort care only ->hospice referral was made by Dr. Carles Collet 01/16/19.  Spent 25 min with pt today, with >50% of this time spent in counseling and care coordination regarding the above problems.  An After Visit Summary was printed and given to the patient.  FOLLOW UP: Return if symptoms worsen or fail to improve. I asked pt if she wanted to schedule a f/u visit today and  she said no.  Signed:  Crissie Sickles, MD           01/21/2019

## 2019-01-23 DIAGNOSIS — I10 Essential (primary) hypertension: Secondary | ICD-10-CM | POA: Diagnosis not present

## 2019-01-23 DIAGNOSIS — G20 Parkinson's disease: Secondary | ICD-10-CM | POA: Diagnosis not present

## 2019-01-23 DIAGNOSIS — I350 Nonrheumatic aortic (valve) stenosis: Secondary | ICD-10-CM | POA: Diagnosis not present

## 2019-01-23 DIAGNOSIS — E785 Hyperlipidemia, unspecified: Secondary | ICD-10-CM | POA: Diagnosis not present

## 2019-01-23 DIAGNOSIS — F419 Anxiety disorder, unspecified: Secondary | ICD-10-CM | POA: Diagnosis not present

## 2019-01-23 DIAGNOSIS — G231 Progressive supranuclear ophthalmoplegia [Steele-Richardson-Olszewski]: Secondary | ICD-10-CM | POA: Diagnosis not present

## 2019-01-23 DIAGNOSIS — M810 Age-related osteoporosis without current pathological fracture: Secondary | ICD-10-CM | POA: Diagnosis not present

## 2019-01-23 DIAGNOSIS — H538 Other visual disturbances: Secondary | ICD-10-CM | POA: Diagnosis not present

## 2019-01-23 DIAGNOSIS — N390 Urinary tract infection, site not specified: Secondary | ICD-10-CM | POA: Diagnosis not present

## 2019-01-23 LAB — URINE CULTURE
MICRO NUMBER:: 1041368
SPECIMEN QUALITY:: ADEQUATE

## 2019-01-26 ENCOUNTER — Encounter: Payer: Self-pay | Admitting: Family Medicine

## 2019-01-26 ENCOUNTER — Telehealth: Payer: Self-pay

## 2019-01-26 DIAGNOSIS — I10 Essential (primary) hypertension: Secondary | ICD-10-CM | POA: Diagnosis not present

## 2019-01-26 DIAGNOSIS — M810 Age-related osteoporosis without current pathological fracture: Secondary | ICD-10-CM | POA: Diagnosis not present

## 2019-01-26 DIAGNOSIS — E785 Hyperlipidemia, unspecified: Secondary | ICD-10-CM | POA: Diagnosis not present

## 2019-01-26 DIAGNOSIS — F419 Anxiety disorder, unspecified: Secondary | ICD-10-CM | POA: Diagnosis not present

## 2019-01-26 DIAGNOSIS — G20 Parkinson's disease: Secondary | ICD-10-CM | POA: Diagnosis not present

## 2019-01-26 DIAGNOSIS — N390 Urinary tract infection, site not specified: Secondary | ICD-10-CM | POA: Diagnosis not present

## 2019-01-26 DIAGNOSIS — H538 Other visual disturbances: Secondary | ICD-10-CM | POA: Diagnosis not present

## 2019-01-26 DIAGNOSIS — I350 Nonrheumatic aortic (valve) stenosis: Secondary | ICD-10-CM | POA: Diagnosis not present

## 2019-01-26 DIAGNOSIS — G231 Progressive supranuclear ophthalmoplegia [Steele-Richardson-Olszewski]: Secondary | ICD-10-CM | POA: Diagnosis not present

## 2019-01-26 NOTE — Telephone Encounter (Signed)
Received forms to re-certify pt for [facility] needs and med order. PCP will review and sign, if appropriate.

## 2019-01-27 DIAGNOSIS — J309 Allergic rhinitis, unspecified: Secondary | ICD-10-CM

## 2019-01-27 DIAGNOSIS — H538 Other visual disturbances: Secondary | ICD-10-CM | POA: Diagnosis not present

## 2019-01-27 DIAGNOSIS — Z86010 Personal history of colonic polyps: Secondary | ICD-10-CM

## 2019-01-27 DIAGNOSIS — M810 Age-related osteoporosis without current pathological fracture: Secondary | ICD-10-CM | POA: Diagnosis not present

## 2019-01-27 DIAGNOSIS — F419 Anxiety disorder, unspecified: Secondary | ICD-10-CM | POA: Diagnosis not present

## 2019-01-27 DIAGNOSIS — M609 Myositis, unspecified: Secondary | ICD-10-CM

## 2019-01-27 DIAGNOSIS — Z9181 History of falling: Secondary | ICD-10-CM

## 2019-01-27 DIAGNOSIS — N390 Urinary tract infection, site not specified: Secondary | ICD-10-CM | POA: Diagnosis not present

## 2019-01-27 DIAGNOSIS — Z86018 Personal history of other benign neoplasm: Secondary | ICD-10-CM

## 2019-01-27 DIAGNOSIS — E785 Hyperlipidemia, unspecified: Secondary | ICD-10-CM | POA: Diagnosis not present

## 2019-01-27 DIAGNOSIS — G20 Parkinson's disease: Secondary | ICD-10-CM | POA: Diagnosis not present

## 2019-01-27 DIAGNOSIS — Z87891 Personal history of nicotine dependence: Secondary | ICD-10-CM

## 2019-01-27 DIAGNOSIS — G231 Progressive supranuclear ophthalmoplegia [Steele-Richardson-Olszewski]: Secondary | ICD-10-CM | POA: Diagnosis not present

## 2019-01-27 DIAGNOSIS — I10 Essential (primary) hypertension: Secondary | ICD-10-CM | POA: Diagnosis not present

## 2019-01-27 DIAGNOSIS — I350 Nonrheumatic aortic (valve) stenosis: Secondary | ICD-10-CM | POA: Diagnosis not present

## 2019-01-27 DIAGNOSIS — I2510 Atherosclerotic heart disease of native coronary artery without angina pectoris: Secondary | ICD-10-CM

## 2019-01-27 NOTE — Telephone Encounter (Signed)
Signed and put in box to go up front.  

## 2019-01-28 DIAGNOSIS — I350 Nonrheumatic aortic (valve) stenosis: Secondary | ICD-10-CM | POA: Diagnosis not present

## 2019-01-28 DIAGNOSIS — G231 Progressive supranuclear ophthalmoplegia [Steele-Richardson-Olszewski]: Secondary | ICD-10-CM | POA: Diagnosis not present

## 2019-01-28 DIAGNOSIS — N390 Urinary tract infection, site not specified: Secondary | ICD-10-CM | POA: Diagnosis not present

## 2019-01-28 DIAGNOSIS — F419 Anxiety disorder, unspecified: Secondary | ICD-10-CM | POA: Diagnosis not present

## 2019-01-28 DIAGNOSIS — H538 Other visual disturbances: Secondary | ICD-10-CM | POA: Diagnosis not present

## 2019-01-28 DIAGNOSIS — J398 Other specified diseases of upper respiratory tract: Secondary | ICD-10-CM | POA: Diagnosis not present

## 2019-01-28 DIAGNOSIS — J698 Pneumonitis due to inhalation of other solids and liquids: Secondary | ICD-10-CM | POA: Diagnosis not present

## 2019-01-28 DIAGNOSIS — I10 Essential (primary) hypertension: Secondary | ICD-10-CM | POA: Diagnosis not present

## 2019-01-28 DIAGNOSIS — E785 Hyperlipidemia, unspecified: Secondary | ICD-10-CM | POA: Diagnosis not present

## 2019-01-28 DIAGNOSIS — M810 Age-related osteoporosis without current pathological fracture: Secondary | ICD-10-CM | POA: Diagnosis not present

## 2019-01-28 DIAGNOSIS — R269 Unspecified abnormalities of gait and mobility: Secondary | ICD-10-CM | POA: Diagnosis not present

## 2019-01-28 DIAGNOSIS — G20 Parkinson's disease: Secondary | ICD-10-CM | POA: Diagnosis not present

## 2019-01-29 DIAGNOSIS — G231 Progressive supranuclear ophthalmoplegia [Steele-Richardson-Olszewski]: Secondary | ICD-10-CM | POA: Diagnosis not present

## 2019-01-29 DIAGNOSIS — J986 Disorders of diaphragm: Secondary | ICD-10-CM | POA: Diagnosis not present

## 2019-01-29 DIAGNOSIS — R4702 Dysphasia: Secondary | ICD-10-CM | POA: Diagnosis not present

## 2019-02-04 DIAGNOSIS — E785 Hyperlipidemia, unspecified: Secondary | ICD-10-CM | POA: Diagnosis not present

## 2019-02-04 DIAGNOSIS — F419 Anxiety disorder, unspecified: Secondary | ICD-10-CM | POA: Diagnosis not present

## 2019-02-04 DIAGNOSIS — G231 Progressive supranuclear ophthalmoplegia [Steele-Richardson-Olszewski]: Secondary | ICD-10-CM | POA: Diagnosis not present

## 2019-02-04 DIAGNOSIS — M810 Age-related osteoporosis without current pathological fracture: Secondary | ICD-10-CM | POA: Diagnosis not present

## 2019-02-04 DIAGNOSIS — I350 Nonrheumatic aortic (valve) stenosis: Secondary | ICD-10-CM | POA: Diagnosis not present

## 2019-02-04 DIAGNOSIS — I10 Essential (primary) hypertension: Secondary | ICD-10-CM | POA: Diagnosis not present

## 2019-02-04 DIAGNOSIS — N390 Urinary tract infection, site not specified: Secondary | ICD-10-CM | POA: Diagnosis not present

## 2019-02-04 DIAGNOSIS — H538 Other visual disturbances: Secondary | ICD-10-CM | POA: Diagnosis not present

## 2019-02-04 DIAGNOSIS — G20 Parkinson's disease: Secondary | ICD-10-CM | POA: Diagnosis not present

## 2019-02-06 ENCOUNTER — Other Ambulatory Visit: Payer: Medicare HMO

## 2019-02-06 DIAGNOSIS — G20 Parkinson's disease: Secondary | ICD-10-CM | POA: Diagnosis not present

## 2019-02-06 DIAGNOSIS — N390 Urinary tract infection, site not specified: Secondary | ICD-10-CM | POA: Diagnosis not present

## 2019-02-06 DIAGNOSIS — G231 Progressive supranuclear ophthalmoplegia [Steele-Richardson-Olszewski]: Secondary | ICD-10-CM | POA: Diagnosis not present

## 2019-02-06 DIAGNOSIS — I10 Essential (primary) hypertension: Secondary | ICD-10-CM | POA: Diagnosis not present

## 2019-02-06 DIAGNOSIS — I350 Nonrheumatic aortic (valve) stenosis: Secondary | ICD-10-CM | POA: Diagnosis not present

## 2019-02-06 DIAGNOSIS — E785 Hyperlipidemia, unspecified: Secondary | ICD-10-CM | POA: Diagnosis not present

## 2019-02-06 DIAGNOSIS — H538 Other visual disturbances: Secondary | ICD-10-CM | POA: Diagnosis not present

## 2019-02-06 DIAGNOSIS — M810 Age-related osteoporosis without current pathological fracture: Secondary | ICD-10-CM | POA: Diagnosis not present

## 2019-02-06 DIAGNOSIS — F419 Anxiety disorder, unspecified: Secondary | ICD-10-CM | POA: Diagnosis not present

## 2019-02-12 ENCOUNTER — Encounter: Payer: Self-pay | Admitting: Family Medicine

## 2019-02-12 ENCOUNTER — Ambulatory Visit (INDEPENDENT_AMBULATORY_CARE_PROVIDER_SITE_OTHER): Payer: Medicare HMO | Admitting: Family Medicine

## 2019-02-12 ENCOUNTER — Other Ambulatory Visit: Payer: Self-pay

## 2019-02-12 VITALS — BP 146/79 | HR 99 | Temp 98.1°F | Resp 16

## 2019-02-12 DIAGNOSIS — L602 Onychogryphosis: Secondary | ICD-10-CM

## 2019-02-12 DIAGNOSIS — G8929 Other chronic pain: Secondary | ICD-10-CM

## 2019-02-12 DIAGNOSIS — B351 Tinea unguium: Secondary | ICD-10-CM | POA: Diagnosis not present

## 2019-02-12 DIAGNOSIS — M79674 Pain in right toe(s): Secondary | ICD-10-CM | POA: Diagnosis not present

## 2019-02-12 NOTE — Progress Notes (Signed)
OFFICE VISIT  02/12/2019   CC:  Chief Complaint  Patient presents with  . Ingrown toenail   HPI:    Patient is a 85 y.o. Caucasian female who presents accompanied by caregiver for ingrown toenail. Right great toe thick, hurts some mainly when trying to walk, onset about 1 yr ago. Caregivers just got her family's permission to come to [facility] to get this evaluated.   They have soaked it in listerine and epsom salts occasionally.  Past Medical History:  Diagnosis Date  . ALLERGIC RHINITIS 11/21/2006  . At high risk for aspiration 12/2017   Progressive oral pharyngeal dysfunction/dysphagia: pulm recommended daily vest with cough assist device + albuterol nebulized  . BACK PAIN 12/17/2006  . Chronic renal insufficiency, stage 3 (moderate)    GFR 50s  . COLONIC POLYPS, HX OF 11/21/2006  . HYPERTENSION 11/21/2006  . Hypertensive retinopathy of both eyes   . Mild aortic stenosis   . OSTEOPENIA 08/11/2009  . OSTEOPOROSIS 11/21/2006  . PEDAL EDEMA 07/05/2008  . Progressive supranuclear palsy (HCC)    Dr. Carles Collet, botox inj to saliv glands 01/2018 thru 12/2018->then pt/family elected for comfort care/hospice.  . Sialorrhea    botulinum inj saliv glands 2020->stopped 12/2018 due to change to comfort care/hospice  . SYSTOLIC MURMUR [id number]    Past Surgical History:  Procedure Laterality Date  . BREAST EXCISIONAL BIOPSY Right    benign  . BREAST SURGERY     bx  . CATARACT EXTRACTION Bilateral 2012  . HEMORRHOID SURGERY    . TONSILLECTOMY    . TRANSTHORACIC ECHOCARDIOGRAM  04/15/2017   EF 55-60%, mild AS (no change from 2017), grd I DD.  Repeat 2 yrs planned.    Outpatient Medications Prior to Visit  Medication Sig Dispense Refill  . albuterol (PROVENTIL) (2.5 MG/3ML) 0.083% nebulizer solution Take 3 mLs (2.5 mg total) by nebulization every 4 (four) hours as needed for wheezing or shortness of breath. 75 mL 5  . amLODipine (NORVASC) 5 MG tablet Take 1 tablet (5 mg total) by mouth daily. 90  tablet 3  . aspirin 81 MG tablet Take 81 mg by mouth daily.      Marland Kitchen atorvastatin (LIPITOR) 10 MG tablet Take 1 tablet (10 mg total) by mouth daily. 90 tablet 1  . benazepril (LOTENSIN) 40 MG tablet Take 1 tablet (40 mg total) by mouth daily. 90 tablet 0  . Calcium Carbonate (CALTRATE 600) 1500 MG TABS Take by mouth 2 (two) times daily.      . carbidopa-levodopa (SINEMET IR) 25-100 MG tablet Take 1 tablet by mouth 3 (three) times daily. Take 1 tablet by mouth 2 times daily. AFTERNOON AND BEDTIME    . clonazePAM (KLONOPIN) 0.5 MG tablet TAKE 1/2 TO 1 TABLET BY MOUTH TWICE A DAY AS NEEDED 60 tablet 1  . Multiple Vitamin (MULTIVITAMIN) tablet Take 1 tablet by mouth daily.      . Omega-3 Fatty Acids (FISH OIL) 1200 MG CAPS Take 1 capsule by mouth daily.    . pantoprazole (PROTONIX) 40 MG tablet TAKE 1 TABLET BY MOUTH EVERY DAY 90 tablet 1  . sulfamethoxazole-trimethoprim (BACTRIM DS) 800-160 MG tablet Take 1 tablet by mouth 2 (two) times daily. (Patient not taking: Reported on 02/12/2019) 10 tablet 0   No facility-administered medications prior to visit.     Allergies  Allergen Reactions  . Codeine Phosphate     ROS As per HPI  PE: Blood pressure (!) 146/79, pulse 99, temperature 98.1 F (36.7 C),  temperature source Temporal, resp. rate 16, SpO2 93 %. Gen: Alert, well appearing.  Patient is oriented to person, place, time, and situation. AFFECT: pleasant, lucid thought and speech. R great toe very thick, long, some ingrowing noted but no erythema or tenderness.  No exudate or nail avulsion.  LABS:    Chemistry      Component Value Date/Time   NA 139 05/22/2018 1113   K 3.7 05/22/2018 1113   CL 104 05/22/2018 1113   CO2 29 05/22/2018 1113   BUN 20 05/22/2018 1113   CREATININE 0.94 05/22/2018 1113   CREATININE 1.10 (H) 06/13/2015 1404      Component Value Date/Time   CALCIUM 9.6 05/22/2018 1113   ALKPHOS 74 05/22/2018 1113   AST 26 05/22/2018 1113   ALT 7 05/22/2018 1113    BILITOT 0.8 05/22/2018 1113      IMPRESSION AND PLAN:  1) Hypertrophic and mycotic right great toenail. Mildly symptomatic. Refer to podiatry for expert nail trimming/care.  An After Visit Summary was printed and given to the patient.  FOLLOW UP: Return for as needed.  Signed:  Crissie Sickles, MD           02/12/2019

## 2019-02-13 ENCOUNTER — Ambulatory Visit (INDEPENDENT_AMBULATORY_CARE_PROVIDER_SITE_OTHER): Payer: Medicare HMO | Admitting: Podiatry

## 2019-02-13 ENCOUNTER — Telehealth: Payer: Self-pay | Admitting: Family Medicine

## 2019-02-13 ENCOUNTER — Encounter: Payer: Self-pay | Admitting: Podiatry

## 2019-02-13 DIAGNOSIS — M79674 Pain in right toe(s): Secondary | ICD-10-CM

## 2019-02-13 DIAGNOSIS — M79675 Pain in left toe(s): Secondary | ICD-10-CM | POA: Diagnosis not present

## 2019-02-13 DIAGNOSIS — B351 Tinea unguium: Secondary | ICD-10-CM | POA: Diagnosis not present

## 2019-02-13 DIAGNOSIS — I739 Peripheral vascular disease, unspecified: Secondary | ICD-10-CM | POA: Insufficient documentation

## 2019-02-13 NOTE — Telephone Encounter (Signed)
A physical therapist with [facility] called to advise that he went to the patient's home. Services were refused due to patient just having her toenail removed.

## 2019-02-13 NOTE — Progress Notes (Signed)
Complaint:  Visit Type: Patient presents  to my office for  preventative foot care services. Complaint: Patient caregiver states" her nails have grown long and thick and become painful to walk and wear shoes".   Patient has been diagnosed with Parkinson and is a wheelchair.. The patient presents for preventative foot care services.  Podiatric Exam: Vascular: dorsalis pedis and posterior tibial pulses are not  palpable bilateral. Capillary return is immediate. Cold feet.. Skin turgor WNL  Sensorium: Deferred since she is non conversant. Nail Exam: Pt has thick disfigured discolored nails with subungual debris noted bilateral entire nail hallux through fifth toenails Ulcer Exam: There is no evidence of ulcer or pre-ulcerative changes or infection. Orthopedic Exam: Muscle tone and strength are WNL. No limitations in general ROM. No crepitus or effusions noted. Foot type and digits show no abnormalities. Bony prominences are unremarkable. Skin: No Porokeratosis. No infection or ulcers  Diagnosis:  Onychomycosis, , Pain in right toe, pain in left toes  Treatment & Plan Procedures and Treatment: Consent by patient was obtained for treatment procedures.   Debridement of mycotic and hypertrophic toenails, 1 through 5 bilateral and clearing of subungual debris. No ulceration, no infection noted.  Return Visit-Office Procedure: Patient instructed to return to the office for a follow up visit 3 months for continued evaluation and treatment.    Gardiner Barefoot DPM

## 2019-02-13 NOTE — Telephone Encounter (Signed)
Sent to Dr Anitra Lauth as a Jul[facility]ier

## 2019-02-14 NOTE — Telephone Encounter (Signed)
Noted  

## 2019-02-16 ENCOUNTER — Other Ambulatory Visit: Payer: Self-pay

## 2019-02-16 ENCOUNTER — Telehealth: Payer: Self-pay | Admitting: Family Medicine

## 2019-02-16 MED ORDER — ALBUTEROL SULFATE (2.5 MG/3ML) 0.083% IN NEBU: 2.5000 mg | INHALATION_SOLUTION | RESPIRATORY_TRACT | 5 refills | Status: AC | PRN

## 2019-02-16 NOTE — Telephone Encounter (Signed)
Please send Rx for Albuterol to [facility].

## 2019-02-16 NOTE — Telephone Encounter (Signed)
Albuterol neb solution sent to [place].

## 2019-02-18 ENCOUNTER — Telehealth: Payer: Self-pay | Admitting: Family Medicine

## 2019-02-18 NOTE — Telephone Encounter (Signed)
Watch for symptoms. Quarantine 14d.

## 2019-02-18 NOTE — Telephone Encounter (Signed)
Pt was expose by her care giver this weekend to covid-19 and Tom bass from [facility] was not able to have PT visit/ please advise

## 2019-02-18 NOTE — Telephone Encounter (Signed)
--  FYI Dr Anitra Lauth

## 2019-02-18 NOTE — Telephone Encounter (Signed)
Pt's other caregiver advised.

## 2019-02-23 ENCOUNTER — Telehealth: Payer: Self-pay

## 2019-02-23 NOTE — Telephone Encounter (Signed)
Signed and put in box to go up front.  

## 2019-02-23 NOTE — Telephone Encounter (Signed)
Received fax from [place], order to add Bactrim to med profile and d/c cephelaxin. PCP will review and sign, if appropriate.

## 2019-02-24 DIAGNOSIS — R112 Nausea with vomiting, unspecified: Secondary | ICD-10-CM | POA: Diagnosis not present

## 2019-02-24 DIAGNOSIS — Z888 Allergy status to other drugs, medicaments and biological substances status: Secondary | ICD-10-CM | POA: Diagnosis not present

## 2019-02-24 DIAGNOSIS — R911 Solitary pulmonary nodule: Secondary | ICD-10-CM | POA: Diagnosis not present

## 2019-02-24 DIAGNOSIS — Z20828 Contact with and (suspected) exposure to other viral communicable diseases: Secondary | ICD-10-CM | POA: Diagnosis not present

## 2019-02-24 DIAGNOSIS — Z7951 Long term (current) use of inhaled steroids: Secondary | ICD-10-CM | POA: Diagnosis not present

## 2019-02-24 DIAGNOSIS — Z7982 Long term (current) use of aspirin: Secondary | ICD-10-CM | POA: Diagnosis not present

## 2019-02-24 DIAGNOSIS — R1110 Vomiting, unspecified: Secondary | ICD-10-CM | POA: Diagnosis not present

## 2019-02-24 DIAGNOSIS — R9082 White matter disease, unspecified: Secondary | ICD-10-CM | POA: Diagnosis not present

## 2019-02-24 DIAGNOSIS — R55 Syncope and collapse: Secondary | ICD-10-CM | POA: Diagnosis not present

## 2019-02-24 DIAGNOSIS — R1111 Vomiting without nausea: Secondary | ICD-10-CM | POA: Diagnosis not present

## 2019-02-24 DIAGNOSIS — K8689 Other specified diseases of pancreas: Secondary | ICD-10-CM | POA: Diagnosis not present

## 2019-02-24 DIAGNOSIS — R0902 Hypoxemia: Secondary | ICD-10-CM | POA: Diagnosis not present

## 2019-02-24 DIAGNOSIS — R531 Weakness: Secondary | ICD-10-CM | POA: Diagnosis not present

## 2019-02-24 DIAGNOSIS — Z03818 Encounter for observation for suspected exposure to other biological agents ruled out: Secondary | ICD-10-CM | POA: Diagnosis not present

## 2019-02-24 DIAGNOSIS — Z79899 Other long term (current) drug therapy: Secondary | ICD-10-CM | POA: Diagnosis not present

## 2019-02-24 DIAGNOSIS — I959 Hypotension, unspecified: Secondary | ICD-10-CM | POA: Diagnosis not present

## 2019-02-24 DIAGNOSIS — R110 Nausea: Secondary | ICD-10-CM | POA: Diagnosis not present

## 2019-02-24 DIAGNOSIS — I10 Essential (primary) hypertension: Secondary | ICD-10-CM | POA: Diagnosis not present

## 2019-02-26 ENCOUNTER — Ambulatory Visit: Payer: Medicare HMO | Admitting: Neurology

## 2019-02-27 DIAGNOSIS — J698 Pneumonitis due to inhalation of other solids and liquids: Secondary | ICD-10-CM | POA: Diagnosis not present

## 2019-02-27 DIAGNOSIS — R269 Unspecified abnormalities of gait and mobility: Secondary | ICD-10-CM | POA: Diagnosis not present

## 2019-02-27 DIAGNOSIS — J398 Other specified diseases of upper respiratory tract: Secondary | ICD-10-CM | POA: Diagnosis not present

## 2019-02-27 DIAGNOSIS — G231 Progressive supranuclear ophthalmoplegia [Steele-Richardson-Olszewski]: Secondary | ICD-10-CM | POA: Diagnosis not present

## 2019-02-28 DIAGNOSIS — R4702 Dysphasia: Secondary | ICD-10-CM | POA: Diagnosis not present

## 2019-02-28 DIAGNOSIS — G231 Progressive supranuclear ophthalmoplegia [Steele-Richardson-Olszewski]: Secondary | ICD-10-CM | POA: Diagnosis not present

## 2019-02-28 DIAGNOSIS — J986 Disorders of diaphragm: Secondary | ICD-10-CM | POA: Diagnosis not present

## 2019-04-01 DIAGNOSIS — R0902 Hypoxemia: Secondary | ICD-10-CM | POA: Diagnosis not present

## 2019-04-01 DIAGNOSIS — Z23 Encounter for immunization: Secondary | ICD-10-CM | POA: Diagnosis not present

## 2019-04-01 DIAGNOSIS — R52 Pain, unspecified: Secondary | ICD-10-CM | POA: Diagnosis not present

## 2019-04-01 DIAGNOSIS — S0990XA Unspecified injury of head, initial encounter: Secondary | ICD-10-CM | POA: Diagnosis not present

## 2019-04-01 DIAGNOSIS — Y999 Unspecified external cause status: Secondary | ICD-10-CM | POA: Diagnosis not present

## 2019-04-01 DIAGNOSIS — S199XXA Unspecified injury of neck, initial encounter: Secondary | ICD-10-CM | POA: Diagnosis not present

## 2019-04-01 DIAGNOSIS — W1839XA Other fall on same level, initial encounter: Secondary | ICD-10-CM | POA: Diagnosis not present

## 2019-04-01 DIAGNOSIS — W19XXXA Unspecified fall, initial encounter: Secondary | ICD-10-CM | POA: Diagnosis not present

## 2019-04-01 DIAGNOSIS — M25511 Pain in right shoulder: Secondary | ICD-10-CM | POA: Diagnosis not present

## 2019-04-01 DIAGNOSIS — I10 Essential (primary) hypertension: Secondary | ICD-10-CM | POA: Diagnosis not present

## 2019-04-17 ENCOUNTER — Ambulatory Visit: Payer: Medicare HMO | Admitting: Neurology

## 2019-05-08 ENCOUNTER — Ambulatory Visit: Payer: Medicare HMO | Admitting: Podiatry

## 2019-06-21 IMAGING — DX DG HIP (WITH OR WITHOUT PELVIS) 2-3V*R*
3 series · 3 of 3 positions shown · non-contrast
Comparison: None.

CLINICAL DATA: Right groin pain for 2 weeks without injury.

EXAM:
DG HIP (WITH OR WITHOUT PELVIS) 2-3V RIGHT

[pelvis ap]
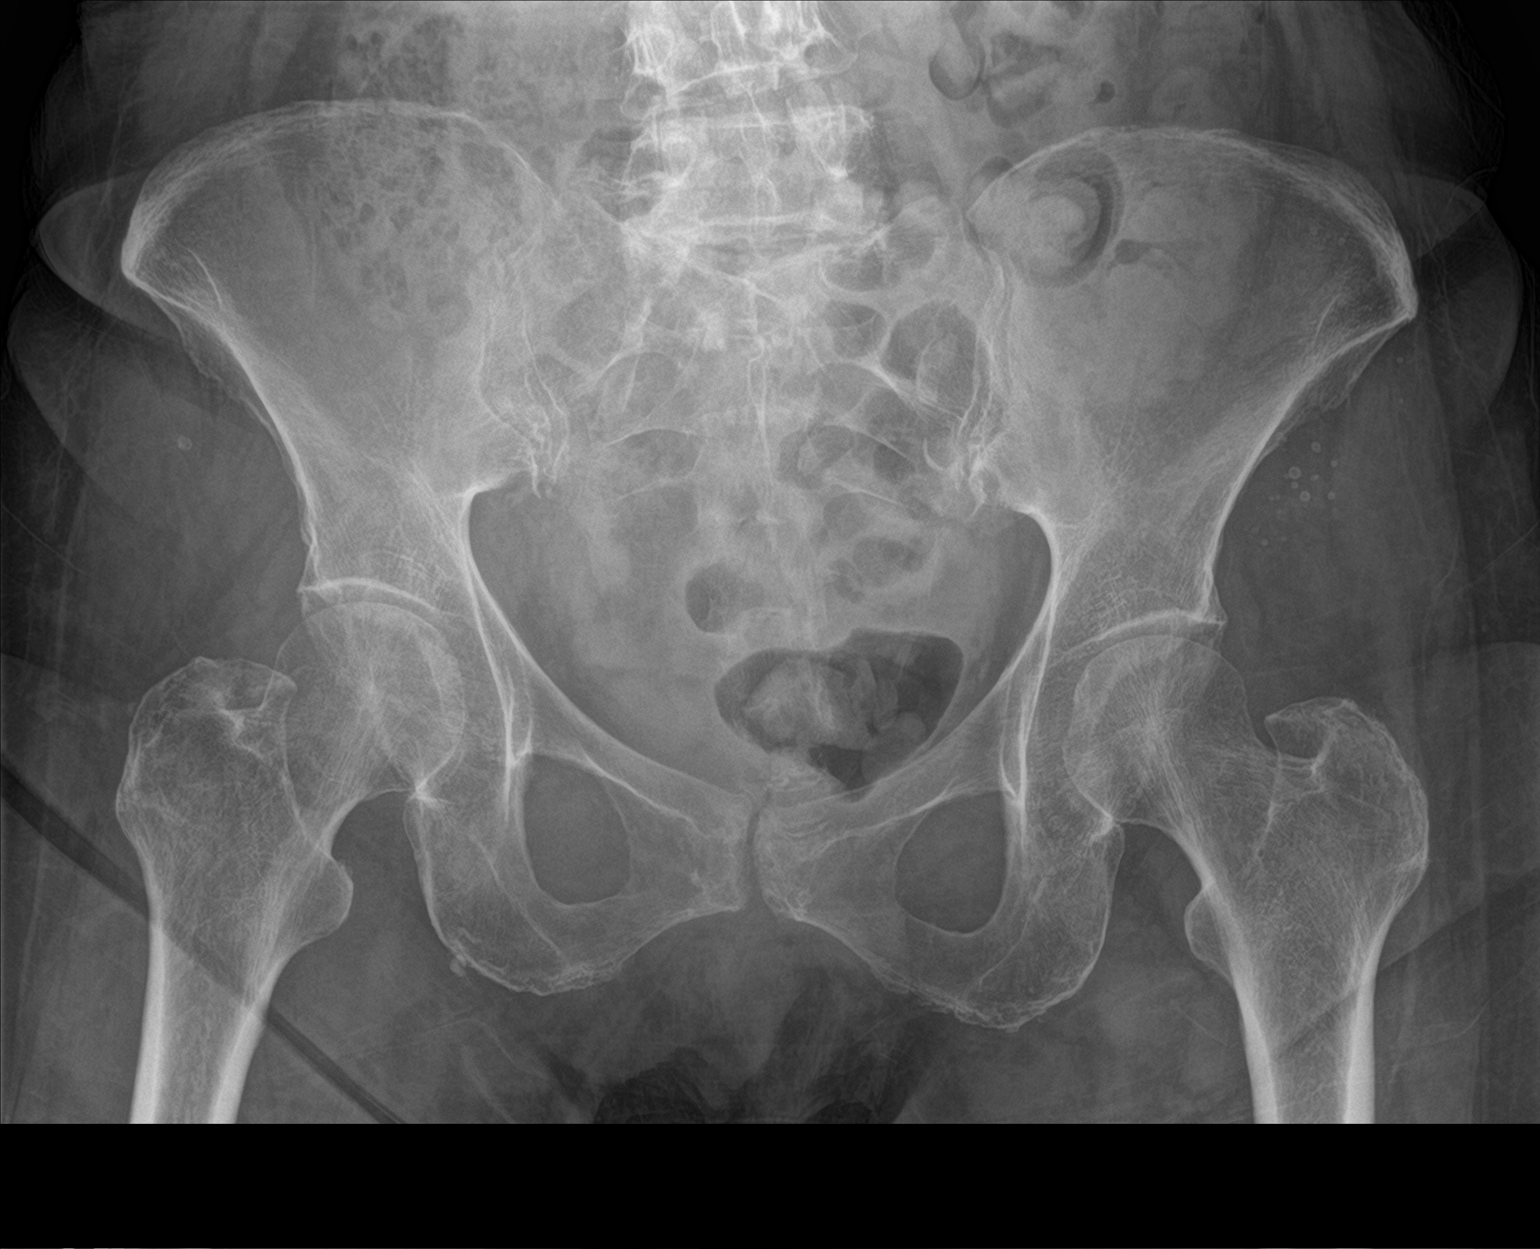

[hip ap]
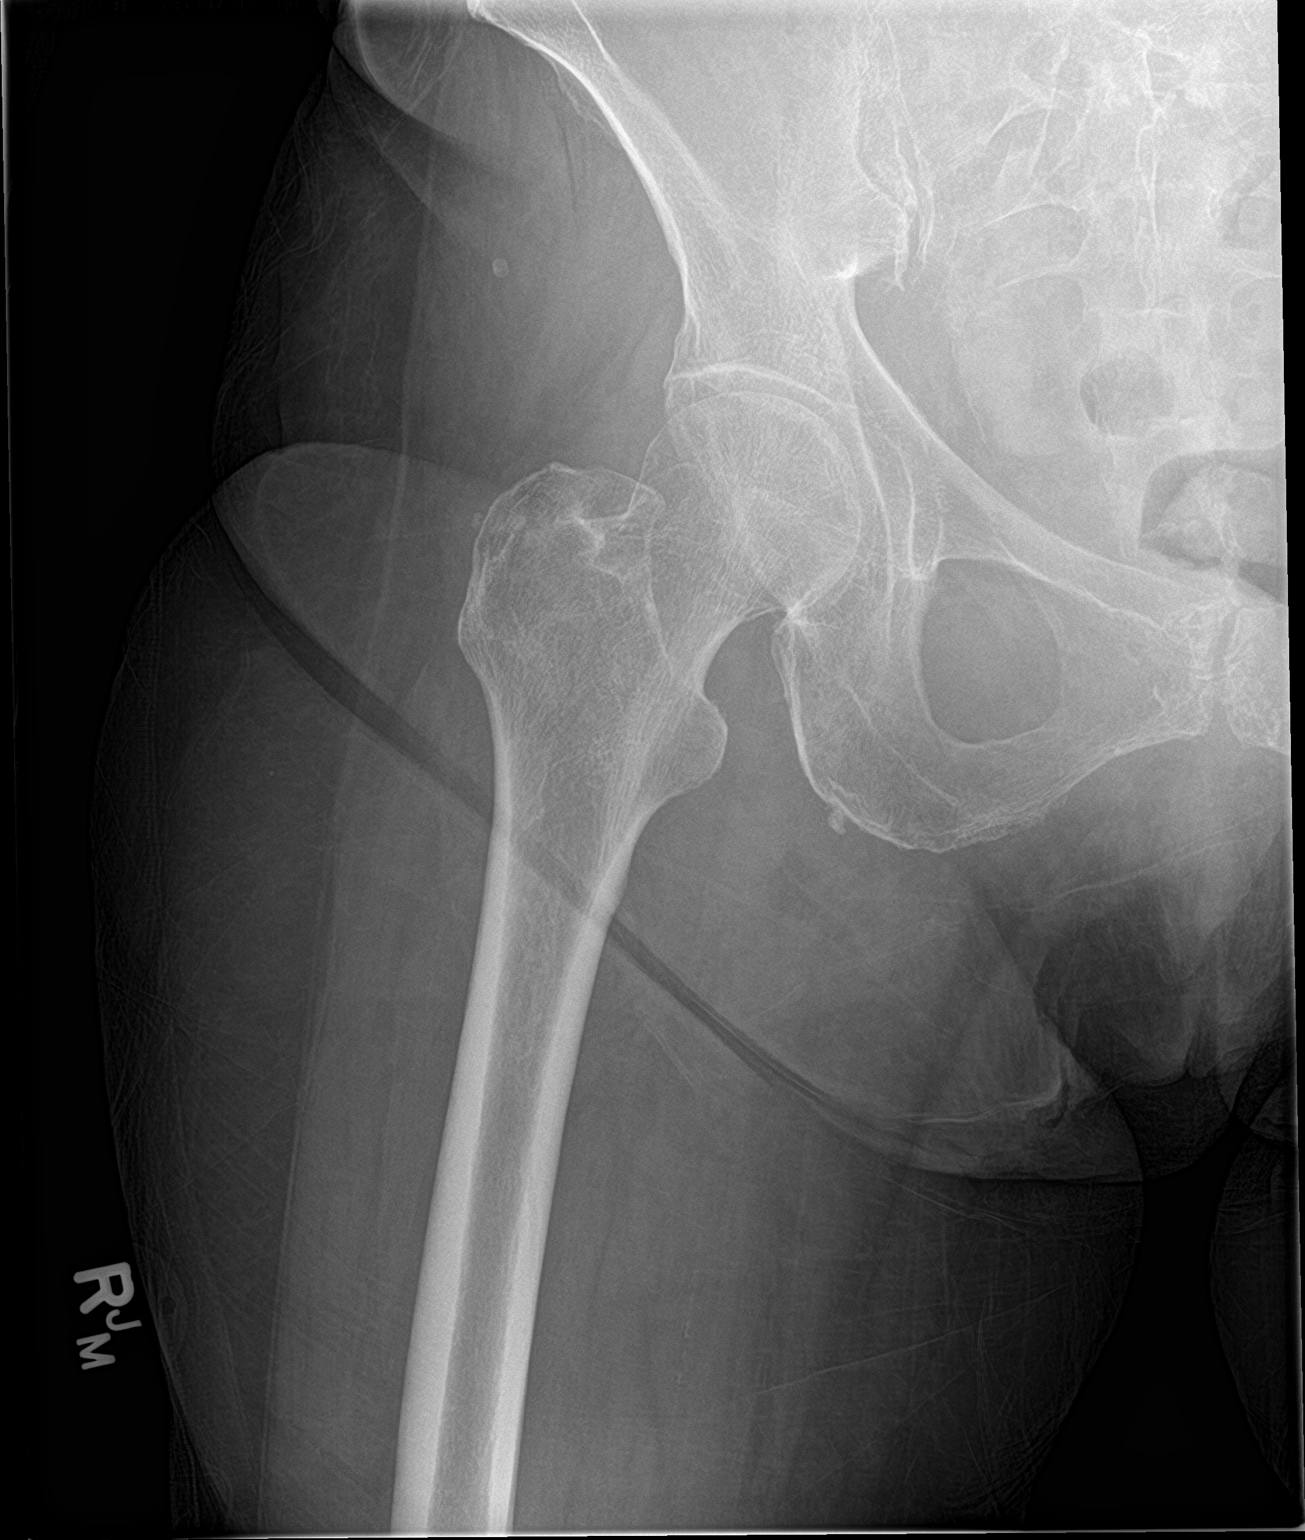

[hip lat]
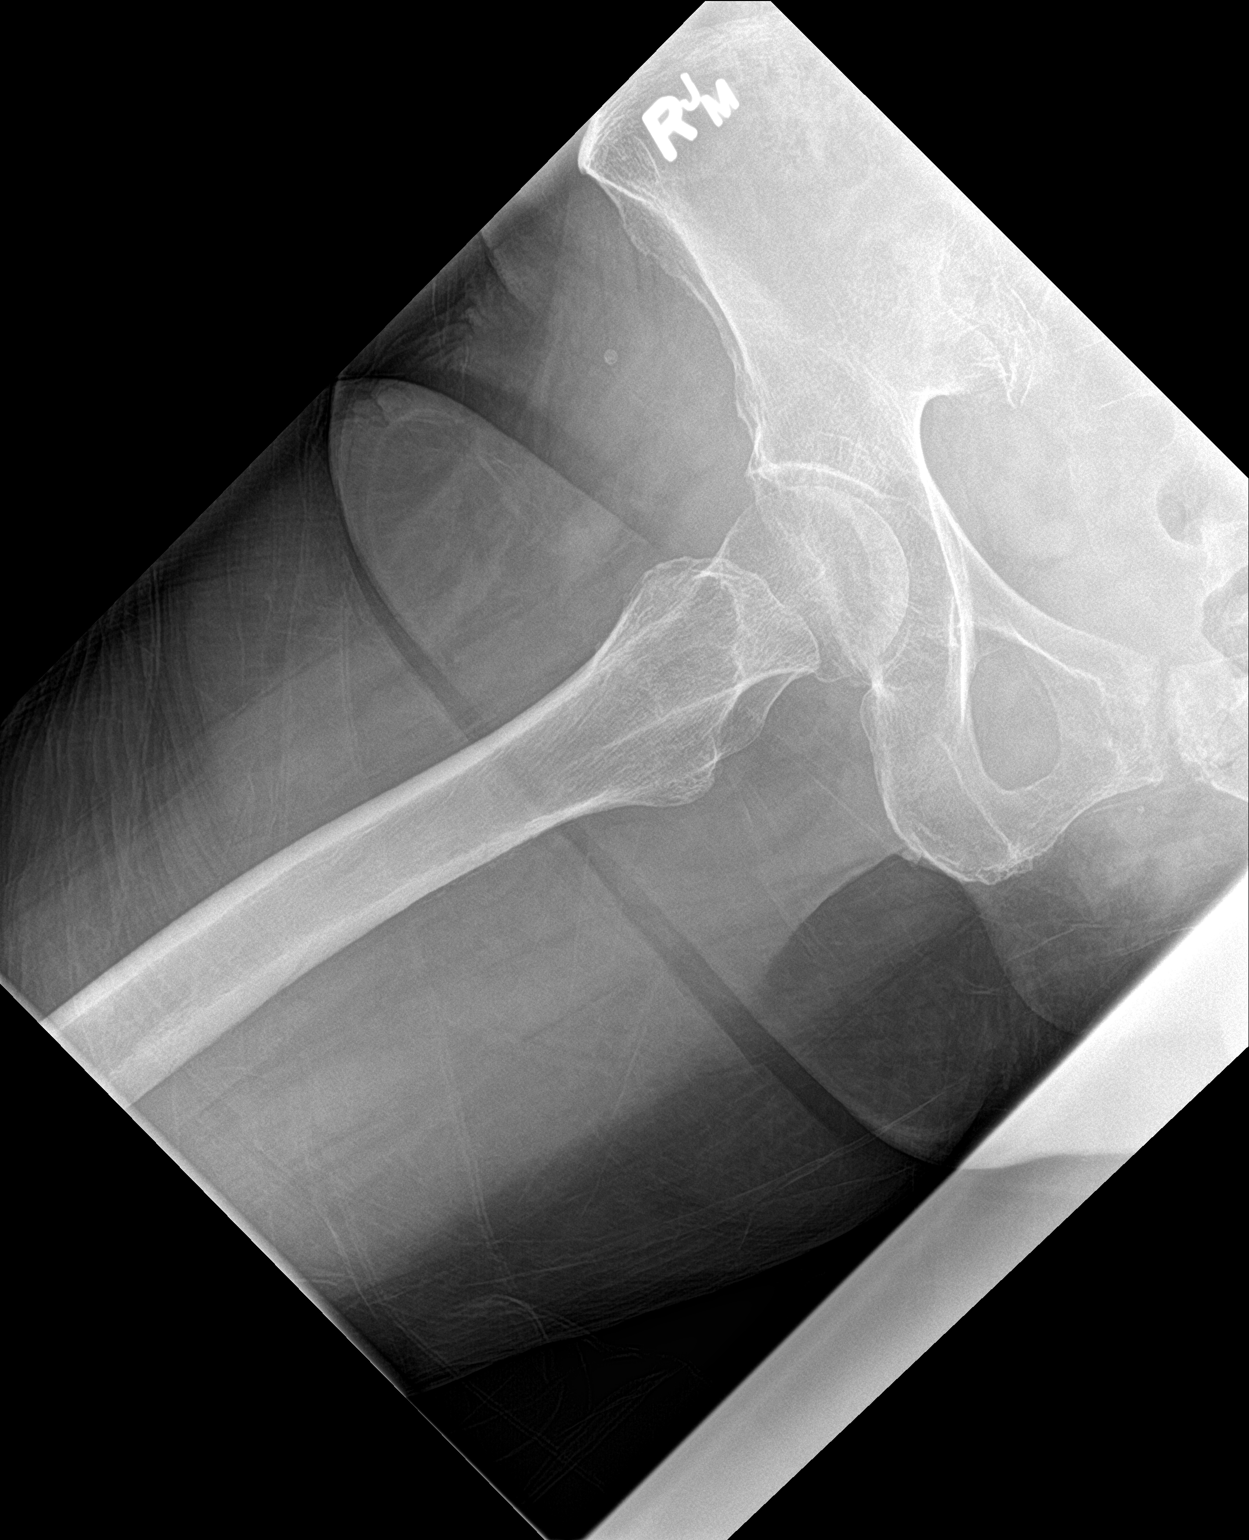

[3 of 3 positions shown; findings below may reference images not displayed]

FINDINGS: There is no evidence of acute fracture or hip dislocation. No
significant arthropathic changes are seen of the hips for age.
Scoliosis and degenerative changes are partially visualized in the
lower lumbar spine. Small calcified granulomas are noted in the
flanks.
IMPRESSION: No evidence of acute osseous abnormality or significant hip
arthropathy.

## 2019-07-02 ENCOUNTER — Other Ambulatory Visit: Payer: Self-pay | Admitting: Family Medicine

## 2019-07-25 DEATH — deceased
